# Patient Record
Sex: Female | Born: 1965 | Race: Black or African American | Hispanic: No | Marital: Married | Smoking: Never smoker
Health system: Southern US, Community
[De-identification: ages and names within clinical notes are randomized; demographics above are authoritative.]

## PROBLEM LIST (undated history)

## (undated) DIAGNOSIS — R002 Palpitations: Secondary | ICD-10-CM

## (undated) DIAGNOSIS — G40909 Epilepsy, unspecified, not intractable, without status epilepticus: Secondary | ICD-10-CM

## (undated) DIAGNOSIS — R0600 Dyspnea, unspecified: Secondary | ICD-10-CM

## (undated) DIAGNOSIS — E669 Obesity, unspecified: Secondary | ICD-10-CM

## (undated) DIAGNOSIS — R3129 Other microscopic hematuria: Secondary | ICD-10-CM

## (undated) DIAGNOSIS — C8590 Non-Hodgkin lymphoma, unspecified, unspecified site: Secondary | ICD-10-CM

## (undated) DIAGNOSIS — I10 Essential (primary) hypertension: Secondary | ICD-10-CM

## (undated) HISTORY — DX: Essential (primary) hypertension: I10

## (undated) HISTORY — DX: Epilepsy, unspecified, not intractable, without status epilepticus: G40.909

## (undated) HISTORY — DX: Palpitations: R00.2

## (undated) HISTORY — DX: Dyspnea, unspecified: R06.00

## (undated) HISTORY — DX: Non-Hodgkin lymphoma, unspecified, unspecified site: C85.90

## (undated) HISTORY — DX: Obesity, unspecified: E66.9

## (undated) HISTORY — DX: Other microscopic hematuria: R31.29

---

## 2006-06-09 HISTORY — PX: PORTACATH PLACEMENT: SHX2246

## 2006-07-10 DIAGNOSIS — C8590 Non-Hodgkin lymphoma, unspecified, unspecified site: Secondary | ICD-10-CM

## 2006-07-10 HISTORY — DX: Non-Hodgkin lymphoma, unspecified, unspecified site: C85.90

## 2007-06-10 HISTORY — PX: PORT-A-CATH REMOVAL: SHX5289

## 2009-02-01 ENCOUNTER — Emergency Department (HOSPITAL_COMMUNITY): Admission: EM | Admit: 2009-02-01 | Discharge: 2009-02-01 | Payer: Self-pay | Admitting: Emergency Medicine

## 2009-02-27 ENCOUNTER — Emergency Department (HOSPITAL_COMMUNITY): Admission: EM | Admit: 2009-02-27 | Discharge: 2009-02-27 | Payer: Self-pay | Admitting: Emergency Medicine

## 2009-04-05 ENCOUNTER — Ambulatory Visit: Payer: Self-pay | Admitting: Diagnostic Radiology

## 2009-04-05 ENCOUNTER — Ambulatory Visit (HOSPITAL_BASED_OUTPATIENT_CLINIC_OR_DEPARTMENT_OTHER): Admission: RE | Admit: 2009-04-05 | Discharge: 2009-04-05 | Payer: Self-pay

## 2009-07-27 ENCOUNTER — Other Ambulatory Visit: Admission: RE | Admit: 2009-07-27 | Discharge: 2009-07-27 | Payer: Self-pay | Admitting: Family Medicine

## 2009-10-17 ENCOUNTER — Ambulatory Visit: Payer: Self-pay | Admitting: Diagnostic Radiology

## 2009-10-17 ENCOUNTER — Ambulatory Visit (HOSPITAL_BASED_OUTPATIENT_CLINIC_OR_DEPARTMENT_OTHER): Admission: RE | Admit: 2009-10-17 | Discharge: 2009-10-17 | Payer: Self-pay | Admitting: *Deleted

## 2010-06-19 ENCOUNTER — Ambulatory Visit
Admission: RE | Admit: 2010-06-19 | Discharge: 2010-06-19 | Payer: Self-pay | Source: Home / Self Care | Attending: Internal Medicine | Admitting: Internal Medicine

## 2010-07-15 ENCOUNTER — Ambulatory Visit: Payer: Self-pay | Admitting: Family Medicine

## 2010-07-15 ENCOUNTER — Ambulatory Visit: Payer: 59 | Admitting: Internal Medicine

## 2010-07-15 DIAGNOSIS — I10 Essential (primary) hypertension: Secondary | ICD-10-CM

## 2010-07-16 ENCOUNTER — Ambulatory Visit: Payer: 59 | Admitting: Internal Medicine

## 2010-07-16 DIAGNOSIS — I10 Essential (primary) hypertension: Secondary | ICD-10-CM

## 2010-08-07 ENCOUNTER — Ambulatory Visit: Payer: 59 | Admitting: Internal Medicine

## 2010-08-14 ENCOUNTER — Ambulatory Visit: Payer: 59 | Admitting: Internal Medicine

## 2010-08-14 ENCOUNTER — Other Ambulatory Visit: Payer: Self-pay | Admitting: Internal Medicine

## 2010-08-14 ENCOUNTER — Ambulatory Visit (HOSPITAL_BASED_OUTPATIENT_CLINIC_OR_DEPARTMENT_OTHER)
Admission: RE | Admit: 2010-08-14 | Discharge: 2010-08-14 | Disposition: A | Discharge status: Home / Self Care | Payer: 59 | Source: Ambulatory Visit | Attending: Internal Medicine | Admitting: Internal Medicine

## 2010-08-14 DIAGNOSIS — I519 Heart disease, unspecified: Secondary | ICD-10-CM

## 2010-08-14 DIAGNOSIS — R319 Hematuria, unspecified: Secondary | ICD-10-CM

## 2010-08-14 DIAGNOSIS — Z87898 Personal history of other specified conditions: Secondary | ICD-10-CM | POA: Insufficient documentation

## 2010-08-14 DIAGNOSIS — I10 Essential (primary) hypertension: Secondary | ICD-10-CM

## 2010-08-20 ENCOUNTER — Other Ambulatory Visit: Payer: 59

## 2010-08-21 ENCOUNTER — Other Ambulatory Visit: Payer: 59

## 2010-08-22 ENCOUNTER — Other Ambulatory Visit: Payer: 59

## 2010-08-23 ENCOUNTER — Ambulatory Visit
Admission: RE | Admit: 2010-08-23 | Discharge: 2010-08-23 | Disposition: A | Discharge status: Home / Self Care | Payer: 59 | Source: Ambulatory Visit | Attending: Internal Medicine | Admitting: Internal Medicine

## 2010-08-23 DIAGNOSIS — I10 Essential (primary) hypertension: Secondary | ICD-10-CM

## 2010-09-09 ENCOUNTER — Other Ambulatory Visit (HOSPITAL_BASED_OUTPATIENT_CLINIC_OR_DEPARTMENT_OTHER): Payer: Self-pay | Admitting: Hematology and Oncology

## 2010-09-09 ENCOUNTER — Other Ambulatory Visit: Payer: Self-pay | Admitting: Internal Medicine

## 2010-09-09 DIAGNOSIS — C8590 Non-Hodgkin lymphoma, unspecified, unspecified site: Secondary | ICD-10-CM

## 2010-09-10 ENCOUNTER — Other Ambulatory Visit (HOSPITAL_BASED_OUTPATIENT_CLINIC_OR_DEPARTMENT_OTHER): Payer: 59

## 2010-09-11 ENCOUNTER — Other Ambulatory Visit (HOSPITAL_BASED_OUTPATIENT_CLINIC_OR_DEPARTMENT_OTHER): Payer: 59

## 2010-09-14 LAB — POCT I-STAT, CHEM 8
Calcium, Ion: 1.19 mmol/L (ref 1.12–1.32)
Creatinine, Ser: 0.7 mg/dL (ref 0.4–1.2)
Glucose, Bld: 104 mg/dL — ABNORMAL HIGH (ref 70–99)
HCT: 40 % (ref 36.0–46.0)
Hemoglobin: 13.6 g/dL (ref 12.0–15.0)
Potassium: 3.9 mEq/L (ref 3.5–5.1)
TCO2: 26 mmol/L (ref 0–100)

## 2010-09-16 ENCOUNTER — Other Ambulatory Visit (HOSPITAL_BASED_OUTPATIENT_CLINIC_OR_DEPARTMENT_OTHER): Payer: 59

## 2010-09-17 ENCOUNTER — Ambulatory Visit (HOSPITAL_BASED_OUTPATIENT_CLINIC_OR_DEPARTMENT_OTHER)
Admission: RE | Admit: 2010-09-17 | Discharge: 2010-09-17 | Disposition: A | Discharge status: Home / Self Care | Payer: 59 | Source: Ambulatory Visit | Attending: Hematology and Oncology | Admitting: Hematology and Oncology

## 2010-09-17 DIAGNOSIS — C8589 Other specified types of non-Hodgkin lymphoma, extranodal and solid organ sites: Secondary | ICD-10-CM | POA: Insufficient documentation

## 2010-09-17 DIAGNOSIS — C8590 Non-Hodgkin lymphoma, unspecified, unspecified site: Secondary | ICD-10-CM

## 2010-09-17 DIAGNOSIS — Z09 Encounter for follow-up examination after completed treatment for conditions other than malignant neoplasm: Secondary | ICD-10-CM | POA: Insufficient documentation

## 2010-09-17 MED ORDER — IOHEXOL 300 MG/ML  SOLN
97.0000 mL | Freq: Once | INTRAMUSCULAR | Status: AC | PRN
  Administered 2010-09-17: 97 mL via INTRAVENOUS

## 2010-10-17 ENCOUNTER — Other Ambulatory Visit: Payer: Self-pay | Admitting: Internal Medicine

## 2010-10-18 ENCOUNTER — Other Ambulatory Visit: Payer: Self-pay | Admitting: Internal Medicine

## 2010-10-18 DIAGNOSIS — Z1231 Encounter for screening mammogram for malignant neoplasm of breast: Secondary | ICD-10-CM

## 2010-10-21 ENCOUNTER — Ambulatory Visit (HOSPITAL_BASED_OUTPATIENT_CLINIC_OR_DEPARTMENT_OTHER)
Admission: RE | Admit: 2010-10-21 | Discharge: 2010-10-21 | Disposition: A | Discharge status: Home / Self Care | Payer: 59 | Source: Ambulatory Visit | Attending: Internal Medicine | Admitting: Internal Medicine

## 2010-10-21 DIAGNOSIS — Z1231 Encounter for screening mammogram for malignant neoplasm of breast: Secondary | ICD-10-CM

## 2010-11-13 ENCOUNTER — Ambulatory Visit (INDEPENDENT_AMBULATORY_CARE_PROVIDER_SITE_OTHER): Payer: 59 | Admitting: Internal Medicine

## 2010-11-13 DIAGNOSIS — R319 Hematuria, unspecified: Secondary | ICD-10-CM

## 2010-11-13 DIAGNOSIS — I10 Essential (primary) hypertension: Secondary | ICD-10-CM

## 2010-11-27 ENCOUNTER — Encounter: Payer: Self-pay | Admitting: Internal Medicine

## 2011-01-09 ENCOUNTER — Other Ambulatory Visit: Payer: Self-pay | Admitting: Emergency Medicine

## 2011-01-09 DIAGNOSIS — I10 Essential (primary) hypertension: Secondary | ICD-10-CM

## 2011-01-09 NOTE — Telephone Encounter (Signed)
Spoke with Carmie Kanner, RPh at [facility].  Gave VO for refill of amlodipine as above-dhp, rn

## 2011-01-13 MED ORDER — AMLODIPINE BESYLATE 5 MG PO TABS: 5.0000 mg | ORAL_TABLET | Freq: Every day | ORAL | Status: DC

## 2011-02-13 ENCOUNTER — Telehealth: Payer: Self-pay | Admitting: Internal Medicine

## 2011-02-13 ENCOUNTER — Other Ambulatory Visit (INDEPENDENT_AMBULATORY_CARE_PROVIDER_SITE_OTHER): Payer: 59

## 2011-02-13 ENCOUNTER — Encounter: Payer: Self-pay | Admitting: Internal Medicine

## 2011-02-13 DIAGNOSIS — R319 Hematuria, unspecified: Secondary | ICD-10-CM | POA: Insufficient documentation

## 2011-02-13 LAB — POCT URINALYSIS DIPSTICK
Bilirubin, UA: NEGATIVE
Nitrite, UA: NEGATIVE
Urobilinogen, UA: NORMAL

## 2011-02-13 NOTE — Telephone Encounter (Signed)
Spoke with pt and informed of results of urine.  She now has had 3 specimens with persistant microhematuria.  Will refer to urology for cysto.  CT of kidney negative.  Pt voices understanding

## 2011-03-20 ENCOUNTER — Other Ambulatory Visit (INDEPENDENT_AMBULATORY_CARE_PROVIDER_SITE_OTHER): Payer: 59 | Admitting: Emergency Medicine

## 2011-03-20 DIAGNOSIS — L659 Nonscarring hair loss, unspecified: Secondary | ICD-10-CM

## 2011-03-21 LAB — T4, FREE: Free T4: 1.1 ng/dL (ref 0.80–1.80)

## 2011-03-21 LAB — HEPATIC FUNCTION PANEL
AST: 19 U/L (ref 0–37)
Albumin: 4.8 g/dL (ref 3.5–5.2)
Bilirubin, Direct: 0.1 mg/dL (ref 0.0–0.3)
Indirect Bilirubin: 0.4 mg/dL (ref 0.0–0.9)
Total Protein: 7.5 g/dL (ref 6.0–8.3)

## 2011-03-21 LAB — BASIC METABOLIC PANEL
BUN: 14 mg/dL (ref 6–23)
CO2: 29 mEq/L (ref 19–32)
Calcium: 10.4 mg/dL (ref 8.4–10.5)
Creat: 0.71 mg/dL (ref 0.50–1.10)
Glucose, Bld: 98 mg/dL (ref 70–99)
Sodium: 140 mEq/L (ref 135–145)

## 2011-03-21 LAB — TSH: TSH: 0.981 u[IU]/mL (ref 0.350–4.500)

## 2011-03-24 ENCOUNTER — Encounter: Payer: Self-pay | Admitting: Emergency Medicine

## 2011-03-31 ENCOUNTER — Ambulatory Visit (HOSPITAL_BASED_OUTPATIENT_CLINIC_OR_DEPARTMENT_OTHER)
Admission: RE | Admit: 2011-03-31 | Discharge: 2011-03-31 | Disposition: A | Discharge status: Home / Self Care | Payer: 59 | Source: Ambulatory Visit | Attending: Urology | Admitting: Urology

## 2011-03-31 ENCOUNTER — Other Ambulatory Visit (HOSPITAL_BASED_OUTPATIENT_CLINIC_OR_DEPARTMENT_OTHER): Payer: Self-pay | Admitting: Urology

## 2011-03-31 MED ORDER — IOHEXOL 300 MG/ML  SOLN: 125.0000 mL | Freq: Once | INTRAMUSCULAR | Status: AC | PRN

## 2011-04-01 ENCOUNTER — Other Ambulatory Visit (HOSPITAL_BASED_OUTPATIENT_CLINIC_OR_DEPARTMENT_OTHER): Payer: 59

## 2011-04-02 ENCOUNTER — Other Ambulatory Visit (HOSPITAL_BASED_OUTPATIENT_CLINIC_OR_DEPARTMENT_OTHER): Payer: Self-pay | Admitting: Urology

## 2011-04-02 ENCOUNTER — Ambulatory Visit (HOSPITAL_BASED_OUTPATIENT_CLINIC_OR_DEPARTMENT_OTHER)
Admission: RE | Admit: 2011-04-02 | Discharge: 2011-04-02 | Disposition: A | Discharge status: Home / Self Care | Payer: 59 | Source: Ambulatory Visit | Attending: Urology | Admitting: Urology

## 2011-04-02 ENCOUNTER — Encounter (HOSPITAL_BASED_OUTPATIENT_CLINIC_OR_DEPARTMENT_OTHER): Payer: Self-pay

## 2011-04-02 DIAGNOSIS — R1909 Other intra-abdominal and pelvic swelling, mass and lump: Secondary | ICD-10-CM | POA: Insufficient documentation

## 2011-04-02 DIAGNOSIS — Z87898 Personal history of other specified conditions: Secondary | ICD-10-CM | POA: Insufficient documentation

## 2011-04-02 DIAGNOSIS — R3129 Other microscopic hematuria: Secondary | ICD-10-CM | POA: Insufficient documentation

## 2011-04-02 MED ORDER — IOHEXOL 300 MG/ML  SOLN
150.0000 mL | Freq: Once | INTRAMUSCULAR | Status: AC | PRN
  Administered 2011-04-02: 150 mL via INTRAVENOUS

## 2011-05-23 ENCOUNTER — Other Ambulatory Visit: Payer: Self-pay | Admitting: Internal Medicine

## 2011-05-26 NOTE — Telephone Encounter (Deleted)
Norvasc refilled via escribe.  Last OV 11/13/10, none pending

## 2011-05-26 NOTE — Telephone Encounter (Signed)
Norvasc refilled via escribe.  Last OV 11/13/10, no visit pending

## 2011-06-06 ENCOUNTER — Telehealth: Payer: Self-pay | Admitting: Emergency Medicine

## 2011-06-06 NOTE — Telephone Encounter (Signed)
Tiffany [name] called this morning with a question about symptoms she had last night at home.  She states while laying in the bed, she felt her heart begin to race, beat hard and fast "like it was going to beat out of my chest".  No other symptoms at the time.  No ShOB, no diaphoresis, chest pain, presyncopal feeling.  Would like to know if she needs to be seen or what to do?  Advised per DDS, as long as no warning cardiac signs, will see in office next week.  Appt scheduled Thursday at 1pm.  Advised if symptoms return, to seek immediate medical attention if symptoms are accompanied by chest pain, syncopal feeling, shortness of breath.  She is agreeable, verbalized understanding.

## 2011-06-12 ENCOUNTER — Ambulatory Visit: Payer: 59 | Admitting: Internal Medicine

## 2011-09-15 ENCOUNTER — Other Ambulatory Visit (HOSPITAL_BASED_OUTPATIENT_CLINIC_OR_DEPARTMENT_OTHER): Payer: Self-pay | Admitting: Hematology and Oncology

## 2011-09-15 ENCOUNTER — Other Ambulatory Visit: Payer: Self-pay | Admitting: Internal Medicine

## 2011-09-15 DIAGNOSIS — Z139 Encounter for screening, unspecified: Secondary | ICD-10-CM

## 2011-09-16 ENCOUNTER — Other Ambulatory Visit (HOSPITAL_BASED_OUTPATIENT_CLINIC_OR_DEPARTMENT_OTHER): Payer: Self-pay | Admitting: Hematology and Oncology

## 2011-09-17 ENCOUNTER — Ambulatory Visit (HOSPITAL_BASED_OUTPATIENT_CLINIC_OR_DEPARTMENT_OTHER)
Admission: RE | Admit: 2011-09-17 | Discharge: 2011-09-17 | Disposition: A | Discharge status: Home / Self Care | Payer: 59 | Source: Ambulatory Visit | Attending: Hematology and Oncology | Admitting: Hematology and Oncology

## 2011-09-17 DIAGNOSIS — Z0389 Encounter for observation for other suspected diseases and conditions ruled out: Secondary | ICD-10-CM

## 2011-09-17 DIAGNOSIS — C8190 Hodgkin lymphoma, unspecified, unspecified site: Secondary | ICD-10-CM | POA: Insufficient documentation

## 2011-09-17 DIAGNOSIS — Z9221 Personal history of antineoplastic chemotherapy: Secondary | ICD-10-CM | POA: Insufficient documentation

## 2011-09-17 DIAGNOSIS — R599 Enlarged lymph nodes, unspecified: Secondary | ICD-10-CM | POA: Insufficient documentation

## 2011-09-17 MED ORDER — IOHEXOL 300 MG/ML  SOLN
100.0000 mL | Freq: Once | INTRAMUSCULAR | Status: AC | PRN
  Administered 2011-09-17: 100 mL via INTRAVENOUS

## 2011-10-22 ENCOUNTER — Ambulatory Visit (HOSPITAL_BASED_OUTPATIENT_CLINIC_OR_DEPARTMENT_OTHER)
Admission: RE | Admit: 2011-10-22 | Discharge: 2011-10-22 | Disposition: A | Discharge status: Home / Self Care | Payer: 59 | Source: Ambulatory Visit | Attending: Internal Medicine | Admitting: Internal Medicine

## 2011-10-22 DIAGNOSIS — Z139 Encounter for screening, unspecified: Secondary | ICD-10-CM

## 2011-10-22 DIAGNOSIS — Z1231 Encounter for screening mammogram for malignant neoplasm of breast: Secondary | ICD-10-CM | POA: Insufficient documentation

## 2011-11-12 ENCOUNTER — Ambulatory Visit (INDEPENDENT_AMBULATORY_CARE_PROVIDER_SITE_OTHER): Payer: 59 | Admitting: Internal Medicine

## 2011-11-12 ENCOUNTER — Encounter: Payer: Self-pay | Admitting: Internal Medicine

## 2011-11-12 VITALS — BP 136/88 | HR 90 | Temp 98.1°F | Ht 59.0 in | Wt 164.0 lb

## 2011-11-12 DIAGNOSIS — Z1151 Encounter for screening for human papillomavirus (HPV): Secondary | ICD-10-CM

## 2011-11-12 DIAGNOSIS — Z01419 Encounter for gynecological examination (general) (routine) without abnormal findings: Secondary | ICD-10-CM

## 2011-11-12 DIAGNOSIS — C8590 Non-Hodgkin lymphoma, unspecified, unspecified site: Secondary | ICD-10-CM

## 2011-11-12 DIAGNOSIS — Z803 Family history of malignant neoplasm of breast: Secondary | ICD-10-CM

## 2011-11-12 DIAGNOSIS — R319 Hematuria, unspecified: Secondary | ICD-10-CM

## 2011-11-12 DIAGNOSIS — Z8669 Personal history of other diseases of the nervous system and sense organs: Secondary | ICD-10-CM

## 2011-11-12 DIAGNOSIS — C8589 Other specified types of non-Hodgkin lymphoma, extranodal and solid organ sites: Secondary | ICD-10-CM

## 2011-11-12 DIAGNOSIS — Z124 Encounter for screening for malignant neoplasm of cervix: Secondary | ICD-10-CM

## 2011-11-12 LAB — POCT URINALYSIS DIPSTICK
Glucose, UA: NEGATIVE
Nitrite, UA: NEGATIVE
Protein, UA: NEGATIVE
Urobilinogen, UA: 0.2

## 2011-11-12 MED ORDER — ESZOPICLONE 2 MG PO TABS: 2.0000 mg | ORAL_TABLET | Freq: Every day | ORAL | Status: DC

## 2011-11-12 NOTE — Progress Notes (Signed)
Subjective:    Patient ID: Tiffany [name], female    DOB: April 13, 1966, 46 y.o.   MRN: [id number]  HPI Tiffany [name] is here for a comprehensive eval.  She is doing well and is very happy that she has reached her 5 year remission milestone for her NHL.  She was  informed  that her oncologist from [facility] Dr. Elaina Hoops has released her from oncology care and told her she does not need anymore CT scans.  She is having trouble with frequent night time awakenings.  Falls asleep OK but frequently wakes up.  NO night time flushing.    No Known Allergies Past Medical History  Diagnosis Date  . Non Hodgkin's lymphoma 2/08    stage III  . Obesity   . Seizure disorder   . Microscopic hematuria   . Hypertension    Past Surgical History  Procedure Date  . Cesarean section 1998, 2002  . Portacath placement 2008  . Port-a-cath removal 2009   History   Social History  . Marital Status: Married    Spouse Name: Greggory Stallion    Number of Children: 2  . Years of Education: college   Occupational History  . PHLEBOTOMIST    Social History Main Topics  . Smoking status: Never Smoker   . Smokeless tobacco: Never Used  . Alcohol Use: No  . Drug Use: No  . Sexually Active: Yes -- Female partner(s)   Other Topics Concern  . Not on file   Social History Narrative  . No narrative on file   Family History  Problem Relation Age of Onset  . Breast cancer Mother   . Hypertension Mother   . Hypertension Father   . Hypertension Maternal Grandmother   . Hypertension Paternal Grandmother    Patient Active Problem List  Diagnoses  . Hematuria   Current Outpatient Prescriptions on File Prior to Visit  Medication Sig Dispense Refill  . amLODipine (NORVASC) 5 MG tablet TAKE ONE TABLET BY MOUTH EVERY DAY  30 tablet  1  . Cholecalciferol (VITAMIN D) 2000 UNITS CAPS Take 1 capsule by mouth daily.        [name] lamoTRIgine (LAMICTAL) 100 MG tablet Take 100 mg by mouth 2 (two) times daily.        .  eszopiclone (LUNESTA) 2 MG TABS Take 1 tablet (2 mg total) by mouth at bedtime. Take immediately before bedtime  30 tablet  0       Review of Systems    see HPI Objective:   Physical Exam Physical Exam  Vital signs and nursing note reviewed  Constitutional: She is oriented to person, place, and time. She appears well-developed and well-nourished. She is cooperative.  HENT:  Head: Normocephalic and atraumatic.  Right Ear: Tympanic membrane normal.  Left Ear: Tympanic membrane normal.  Nose: Nose normal.  Mouth/Throat: Oropharynx is clear and moist and mucous membranes are normal. No oropharyngeal exudate or posterior oropharyngeal erythema.  Eyes: Conjunctivae and EOM are normal. Pupils are equal, round, and reactive to light.  Neck: Neck supple. No JVD present. Carotid bruit is not present. No mass and no thyromegaly present.  Cardiovascular: Regular rhythm, normal heart sounds, intact distal pulses and normal pulses.  Exam reveals no gallop and no friction rub.   No murmur heard. Pulses:      Dorsalis pedis pulses are 2+ on the right side, and 2+ on the left side.  Pulmonary/Chest: Breath sounds normal. She has no wheezes. She has no rhonchi. She  has no rales. Right breast exhibits no mass, no nipple discharge and no skin change. Left breast exhibits no mass, no nipple discharge and no skin change.  Abdominal: Soft. Bowel sounds are normal. She exhibits no distension and no mass. There is no hepatosplenomegaly. There is no tenderness. There is no CVA tenderness.  Genitourinary: Rectum normal, vagina normal and uterus normal.  No labial fusion. There is no lesion on the right labia. There is no lesion on the left labia. Cervix exhibits no motion tenderness. Right adnexum displays no mass, no tenderness and no fullness. Left adnexum displays no mass, no tenderness and no fullness. No erythema around the vagina.  Musculoskeletal:       No active synovitis to any joint.      Lymphadenopathy:       Right cervical: No superficial cervical adenopathy present.      Left cervical: No superficial cervical adenopathy present.       Right axillary: No pectoral and no lateral adenopathy present.       Left axillary: No pectoral and no lateral adenopathy present.      Right: No inguinal adenopathy present.       Left: No inguinal adenopathy present.  Neurological: She is alert and oriented to person, place, and time. She has normal strength and normal reflexes. No cranial nerve deficit or sensory deficit. She displays a negative Romberg sign. Coordination and gait normal.  Skin: Skin is warm and dry. No abrasion, no bruising, no ecchymosis and no rash noted. No cyanosis. Nails show no clubbing.  Psychiatric: She has a normal mood and affect. Her speech is normal and behavior is normal.          Assessment & Plan:  Health Maintenance  See scanned sheet will get fasting labs tomorrow  Insomnia  Will try Lunesta 2 mg 2-3 times per week hs  NHL  FH mother breast Cancer  Microscopic Hematuria  Work up neg urology        Assessment & Plan:

## 2011-11-12 NOTE — Patient Instructions (Signed)
See me as needed  Call if further poblems with sleep

## 2011-11-13 LAB — CBC WITH DIFFERENTIAL/PLATELET
Basophils Absolute: 0 10*3/uL (ref 0.0–0.1)
Eosinophils Absolute: 0.2 10*3/uL (ref 0.0–0.7)
Lymphocytes Relative: 29 % (ref 12–46)
Lymphs Abs: 1.5 10*3/uL (ref 0.7–4.0)
Neutrophils Relative %: 59 % (ref 43–77)
Platelets: 345 10*3/uL (ref 150–400)
RBC: 4.88 MIL/uL (ref 3.87–5.11)
RDW: 13.9 % (ref 11.5–15.5)
WBC: 5.3 10*3/uL (ref 4.0–10.5)

## 2011-11-13 LAB — COMPREHENSIVE METABOLIC PANEL
ALT: 13 U/L (ref 0–35)
AST: 16 U/L (ref 0–37)
CO2: 30 mEq/L (ref 19–32)
Creat: 0.88 mg/dL (ref 0.50–1.10)
Sodium: 141 mEq/L (ref 135–145)
Total Bilirubin: 0.6 mg/dL (ref 0.3–1.2)
Total Protein: 7.2 g/dL (ref 6.0–8.3)

## 2011-11-13 LAB — LIPID PANEL
Cholesterol: 152 mg/dL (ref 0–200)
LDL Cholesterol: 85 mg/dL (ref 0–99)
Total CHOL/HDL Ratio: 2.8 Ratio
VLDL: 13 mg/dL (ref 0–40)

## 2011-11-13 LAB — TSH: TSH: 1.516 u[IU]/mL (ref 0.350–4.500)

## 2011-11-14 LAB — VITAMIN D 25 HYDROXY (VIT D DEFICIENCY, FRACTURES): Vit D, 25-Hydroxy: 44 ng/mL (ref 30–89)

## 2011-11-18 ENCOUNTER — Telehealth: Payer: Self-pay | Admitting: *Deleted

## 2011-11-18 NOTE — Telephone Encounter (Signed)
Pt notified of normal pap smear and repeat in 2 years.

## 2011-11-20 ENCOUNTER — Telehealth: Payer: Self-pay | Admitting: Internal Medicine

## 2011-11-20 NOTE — Telephone Encounter (Signed)
Spoke with pt.  She is having trace blood in stool last 2 days.  Counseled to get hemmorrhoid suppository and see me in office on Monday  She voices understanding

## 2011-12-08 ENCOUNTER — Ambulatory Visit (INDEPENDENT_AMBULATORY_CARE_PROVIDER_SITE_OTHER): Payer: 59 | Admitting: Family

## 2011-12-08 ENCOUNTER — Encounter: Payer: Self-pay | Admitting: Family

## 2011-12-08 VITALS — BP 126/88 | HR 99 | Temp 98.4°F | Resp 16

## 2011-12-08 DIAGNOSIS — R0789 Other chest pain: Secondary | ICD-10-CM

## 2011-12-08 MED ORDER — CALCIUM CARBONATE ANTACID 500 MG PO CHEW: CHEWABLE_TABLET | ORAL | Status: DC

## 2011-12-08 MED ORDER — ESOMEPRAZOLE MAGNESIUM 40 MG PO CPDR: 40.0000 mg | DELAYED_RELEASE_CAPSULE | Freq: Every day | ORAL | Status: DC

## 2011-12-08 NOTE — Assessment & Plan Note (Signed)
New.  EKG is performed today and is personally reviewed.  It shows normal sinus rhythm- no ischemic changes. Symptoms and history is most consistent with GERD.  Recommended a 2 week course of nexium (samples provided) as well as tums PRN.  We did discuss that if symptoms worsen, she should proceed to the ed and she verbalizes understanding.

## 2011-12-08 NOTE — Patient Instructions (Addendum)
Go to ER if you develop worsening chest pain.  Diet for GERD or PUD Nutrition therapy can help ease the discomfort of gastroesophageal reflux disease (GERD) and peptic ulcer disease (PUD).  HOME CARE INSTRUCTIONS   Eat your meals slowly, in a relaxed setting.   Eat 5 to 6 small meals per day.   If a food causes distress, stop eating it for a period of time.  FOODS TO AVOID  Coffee, regular or decaffeinated.   Cola beverages, regular or low calorie.   Tea, regular or decaffeinated.   Pepper.   Cocoa.   High fat foods, including meats.   Butter, margarine, hydrogenated oil (trans fats).   Peppermint or spearmint (if you have GERD).   Fruits and vegetables if not tolerated.   Alcohol.   Nicotine (smoking or chewing). This is one of the most potent stimulants to acid production in the gastrointestinal tract.   Any food that seems to aggravate your condition.  If you have questions regarding your diet, ask your caregiver or a registered dietitian. TIPS  Lying flat may make symptoms worse. Keep the head of your bed raised 6 to 9 inches (15 to 23 cm) by using a foam wedge or blocks under the legs of the bed.   Do not lay down until 3 hours after eating a meal.   Daily physical activity may help reduce symptoms.  MAKE SURE YOU:   Understand these instructions.   Will watch your condition.   Will get help right away if you are not doing well or get worse.  Document Released: 05/26/2005 Document Revised: 05/15/2011 Document Reviewed: 04/11/2011 [facility] Patient Information 2012 [place], [place].

## 2011-12-08 NOTE — Progress Notes (Signed)
Subjective:    Patient ID: Tiffany [name], female    DOB: 1966/01/19, 46 y.o.   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  She usually sees Dr. Constance Goltz, who is off this afternoon.  (Dr. Katherina Mires records are reviewed). History is significant for seizure disorder (on lamictal and well controlled), HTN (on amlodipine and well controlled) and non-hodgkin's lymphoma which is reportedly in remission.    Chest pain- She reports epigastric discomfort which started last night.  Woke up this AM was fine, then worsened since she got up this AM.  Drank some soda, subsided some, but came back.  Denies associated arm pain, nausea vomitting, dizziness or shortness of breath.  Reports that she at 3 pieces of pizza on Saturday.    Review of Systems See HPI  Past Medical History  Diagnosis Date  . Non Hodgkin's lymphoma 2/08    stage III  . Obesity   . Seizure disorder   . Microscopic hematuria   . Hypertension     History   Social History  . Marital Status: Married    Spouse Name: Greggory Stallion    Number of Children: 2  . Years of Education: college   Occupational History  . PHLEBOTOMIST    Social History Main Topics  . Smoking status: Never Smoker   . Smokeless tobacco: Never Used  . Alcohol Use: No  . Drug Use: No  . Sexually Active: Yes -- Female partner(s)   Other Topics Concern  . Not on file   Social History Narrative  . No narrative on file    Past Surgical History  Procedure Date  . Cesarean section 1998, 2002  . Portacath placement 2008  . Port-a-cath removal 2009    Family History  Problem Relation Age of Onset  . Breast cancer Mother   . Hypertension Mother   . Hypertension Father   . Hypertension Maternal Grandmother   . Hypertension Paternal Grandmother     No Known Allergies  Current Outpatient Prescriptions on File Prior to Visit    Medication Sig Dispense Refill  . amLODipine (NORVASC) 5 MG tablet TAKE ONE TABLET BY MOUTH EVERY DAY  30 tablet  1  . Cholecalciferol (VITAMIN D) 2000 UNITS CAPS Take 1 capsule by mouth daily.        [name] lamoTRIgine (LAMICTAL) 100 MG tablet Take 100 mg by mouth 2 (two) times daily.        [name] esomeprazole (NEXIUM) 40 MG capsule Take 1 capsule (40 mg total) by mouth daily.  14 capsule  0  . eszopiclone (LUNESTA) 2 MG TABS Take 1 tablet (2 mg total) by mouth at bedtime. Take immediately before bedtime  30 tablet  0    BP 126/88  Pulse 99  Temp 98.4 F (36.9 C) (Oral)  Resp 16       Objective:   Physical Exam  Constitutional: She appears well-developed and well-nourished. No distress.  HENT:  Head: Normocephalic and atraumatic.  Cardiovascular: Normal rate and regular rhythm.   No murmur heard. Pulmonary/Chest: Effort normal and breath sounds normal. No respiratory distress. She has no wheezes. She has no rales. She exhibits no tenderness.  Abdominal: Soft. Bowel sounds are normal.  Musculoskeletal: She exhibits no edema.  Lymphadenopathy:    She has no cervical adenopathy.  Skin: Skin is warm and dry.  Psychiatric: She has  a normal mood and affect. Her behavior is normal. Judgment and thought content normal.          Assessment & Plan:

## 2011-12-15 ENCOUNTER — Other Ambulatory Visit: Payer: Self-pay | Admitting: Internal Medicine

## 2012-02-17 ENCOUNTER — Encounter: Payer: Self-pay | Admitting: Family

## 2012-02-17 ENCOUNTER — Ambulatory Visit (INDEPENDENT_AMBULATORY_CARE_PROVIDER_SITE_OTHER): Payer: 59 | Admitting: Family

## 2012-02-17 VITALS — BP 120/80 | HR 114 | Temp 99.2°F | Resp 16

## 2012-02-17 DIAGNOSIS — J029 Acute pharyngitis, unspecified: Secondary | ICD-10-CM

## 2012-02-17 LAB — POCT RAPID STREP A (OFFICE): Rapid Strep A Screen: NEGATIVE

## 2012-02-17 NOTE — Progress Notes (Signed)
  Subjective:    Patient ID: Tiffany [name], female    DOB: [birth year], 46 y.o.   MRN: [id number]  HPI  Tiffany [name] is a 46 yr old female who presents today with chief complaint of sore throat.  Symptoms started yesterday. Tinge of yellow in her nose this AM. Denies coughing. Denies ear pain.        Review of Systems  See HPI      Objective:   Physical Exam  Constitutional: She is oriented to person, place, and time. She appears well-developed and well-nourished.  HENT:  Head: Normocephalic and atraumatic.  Right Ear: Tympanic membrane and ear canal normal.  Left Ear: Tympanic membrane and ear canal normal.  Mouth/Throat: Posterior oropharyngeal erythema present. No oropharyngeal exudate, posterior oropharyngeal edema or tonsillar abscesses.  Neck:       Mild right cervical adenopathy/tenderness.  Cardiovascular: Normal rate and regular rhythm.   No murmur heard. Pulmonary/Chest: Effort normal and breath sounds normal. No respiratory distress. She has no wheezes. She has no rales. She exhibits no tenderness.  Neurological: She is alert and oriented to person, place, and time.  Skin: Skin is warm and dry.  Psychiatric: She has a normal mood and affect. Her behavior is normal. Judgment and thought content normal.          Assessment & Plan:

## 2012-02-17 NOTE — Assessment & Plan Note (Signed)
Rapid strep is negative. Likely viral etiology.   Recommended ibuprofen/chloraseptic spray as needed.  Recommended that pt call if fever >101, if symptoms worsen, or if not feeling better in 2-3 days.

## 2012-04-26 ENCOUNTER — Ambulatory Visit (INDEPENDENT_AMBULATORY_CARE_PROVIDER_SITE_OTHER): Payer: 59 | Admitting: *Deleted

## 2012-04-26 DIAGNOSIS — Z111 Encounter for screening for respiratory tuberculosis: Secondary | ICD-10-CM

## 2012-06-22 ENCOUNTER — Ambulatory Visit (INDEPENDENT_AMBULATORY_CARE_PROVIDER_SITE_OTHER): Payer: 59 | Admitting: Family

## 2012-06-22 ENCOUNTER — Encounter: Payer: Self-pay | Admitting: Family

## 2012-06-22 VITALS — BP 140/98 | HR 95 | Temp 98.5°F | Resp 16

## 2012-06-22 DIAGNOSIS — R0789 Other chest pain: Secondary | ICD-10-CM

## 2012-06-22 DIAGNOSIS — R079 Chest pain, unspecified: Secondary | ICD-10-CM

## 2012-06-22 DIAGNOSIS — I10 Essential (primary) hypertension: Secondary | ICD-10-CM

## 2012-06-22 MED ORDER — ESOMEPRAZOLE MAGNESIUM 40 MG PO CPDR: 40.0000 mg | DELAYED_RELEASE_CAPSULE | Freq: Every day | ORAL | Status: DC

## 2012-06-22 MED ORDER — AMLODIPINE BESYLATE 10 MG PO TABS: 10.0000 mg | ORAL_TABLET | Freq: Every day | ORAL | Status: DC

## 2012-06-22 NOTE — Progress Notes (Signed)
  Subjective:    Patient ID: Tiffany [name], female    DOB: 1965/09/07, 47 y.o.   MRN: [id number]  HPI  Intermittent chest pressure started 1 month ago.  Now constant- 6/10 since last night.  She reports that she has been checking blood pressure at home 130-140/82-90  R 150-160/90-98.  Last dose of amlodipine was this morning.  Pain is mid chest and back up to throat.  Denies associated sob.  Not worsened by exertion. Does not radiate.  No nausea.  Not taking nexium regularly.  Denies calf pain or recent long car trip.    Review of Systems See HPI  Past Medical History  Diagnosis Date  . Non Hodgkin's lymphoma 2/08    stage III  . Obesity   . Seizure disorder   . Microscopic hematuria   . Hypertension     History   Social History  . Marital Status: Married    Spouse Name: Greggory Stallion    Number of Children: 2  . Years of Education: college   Occupational History  . PHLEBOTOMIST    Social History Main Topics  . Smoking status: Never Smoker   . Smokeless tobacco: Never Used  . Alcohol Use: No  . Drug Use: No  . Sexually Active: Yes -- Female partner(s)   Other Topics Concern  . Not on file   Social History Narrative  . No narrative on file    Past Surgical History  Procedure Date  . Cesarean section 1998, 2002  . Portacath placement 2008  . Port-a-cath removal 2009    Family History  Problem Relation Age of Onset  . Breast cancer Mother   . Hypertension Mother   . Hypertension Father   . Hypertension Maternal Grandmother   . Hypertension Paternal Grandmother     No Known Allergies  Current Outpatient Prescriptions on File Prior to Visit  Medication Sig Dispense Refill  . amLODipine (NORVASC) 5 MG tablet TAKE 1 TABLET BY MOUTH ONCE DAILY  90 tablet  3  . lamoTRIgine (LAMICTAL) 100 MG tablet Take 100 mg by mouth 2 (two) times daily.        . calcium carbonate (TUMS) 500 MG chewable tablet One tablet 3 times daily as needed for reflux.      .  Cholecalciferol (VITAMIN D) 2000 UNITS CAPS Take 1 capsule by mouth daily.          BP 140/98  Pulse 95  Temp 98.5 F (36.9 C) (Oral)  Resp 16  SpO2 99%       Objective:   Physical Exam  Constitutional: She appears well-developed and well-nourished. No distress.  HENT:  Head: Normocephalic and atraumatic.  Cardiovascular: Normal rate.   No murmur heard. Pulmonary/Chest: Effort normal and breath sounds normal. No respiratory distress. She has no wheezes. She has no rales. She exhibits no tenderness.  Musculoskeletal: She exhibits no edema.  Skin: Skin is warm.  Psychiatric: She has a normal mood and affect. Her behavior is normal. Judgment and thought content normal.          Assessment & Plan:

## 2012-06-22 NOTE — Assessment & Plan Note (Signed)
Pt stopped nexium.  Pain is mid chest and radiates up esophagus.  I suspect that this pain is likely GERD related.  EKG is performed today and shows NSR without acute changes. I will arrange a stress test however.  Pt is agreeable.

## 2012-06-22 NOTE — Assessment & Plan Note (Signed)
Deteriorated.  Increase amlodipine from 5-10mg  once daily.

## 2012-06-22 NOTE — Patient Instructions (Addendum)
Go to ER if worsening chest pain or shortness of breath. Increase amlodipine. Follow up in 2 weeks for BP check. You will be contacted about your referral for stress test.  Please let us know if you have not heard back within 1 week about your referral.

## 2012-07-07 ENCOUNTER — Encounter (HOSPITAL_COMMUNITY): Payer: 59

## 2012-07-08 ENCOUNTER — Telehealth: Payer: Self-pay | Admitting: *Deleted

## 2012-07-08 MED ORDER — ESOMEPRAZOLE MAGNESIUM 40 MG PO CPDR: 40.0000 mg | DELAYED_RELEASE_CAPSULE | Freq: Every day | ORAL | Status: DC

## 2012-07-08 NOTE — Telephone Encounter (Signed)
Received call from pt stating Nexium has been working well, no further chest pain. Pt is requesting refill to [facility]. Refill sent, pt has follow up tomorrow at 1:15pm.

## 2012-07-09 ENCOUNTER — Encounter: Payer: Self-pay | Admitting: Family

## 2012-07-09 ENCOUNTER — Ambulatory Visit (INDEPENDENT_AMBULATORY_CARE_PROVIDER_SITE_OTHER): Payer: 59 | Admitting: Family

## 2012-07-09 VITALS — BP 130/80 | HR 93 | Temp 98.6°F | Resp 16 | Wt 154.0 lb

## 2012-07-09 DIAGNOSIS — R0789 Other chest pain: Secondary | ICD-10-CM

## 2012-07-09 DIAGNOSIS — I10 Essential (primary) hypertension: Secondary | ICD-10-CM

## 2012-07-09 NOTE — Assessment & Plan Note (Signed)
Blood pressure improved on amlodipine 10.  Continue current dose.

## 2012-07-09 NOTE — Patient Instructions (Addendum)
Please schedule a follow up appointment in 3 months.

## 2012-07-09 NOTE — Assessment & Plan Note (Signed)
On PPI, symptoms resolved.  Likely due to gerd.

## 2012-07-09 NOTE — Progress Notes (Signed)
  Subjective:    Patient ID: Tiffany [name], female    DOB: 12-19-1965, 47 y.o.   MRN: [id number]  HPI  Pt presents today for follow up.   GERD- reports chest pain is improved on nexium.  No longer having chest pain.  HTN-  Denies SOB, CP, Swelling.      Review of Systems     Objective:   Physical Exam  Constitutional: She is oriented to person, place, and time. She appears well-developed and well-nourished. No distress.  HENT:  Head: Normocephalic and atraumatic.  Cardiovascular: Normal rate and regular rhythm.   No murmur heard. Pulmonary/Chest: Effort normal and breath sounds normal.  Neurological: She is alert and oriented to person, place, and time.  Skin: Skin is warm and dry.  Psychiatric: She has a normal mood and affect. Her behavior is normal. Judgment and thought content normal.          Assessment & Plan:

## 2012-07-13 ENCOUNTER — Ambulatory Visit (HOSPITAL_COMMUNITY): Payer: 59 | Attending: Cardiology | Admitting: Radiology

## 2012-07-13 VITALS — BP 125/84 | Ht 59.0 in | Wt 150.0 lb

## 2012-07-13 DIAGNOSIS — R079 Chest pain, unspecified: Secondary | ICD-10-CM

## 2012-07-13 DIAGNOSIS — I10 Essential (primary) hypertension: Secondary | ICD-10-CM | POA: Insufficient documentation

## 2012-07-13 DIAGNOSIS — R0789 Other chest pain: Secondary | ICD-10-CM

## 2012-07-13 MED ORDER — TECHNETIUM TC 99M SESTAMIBI GENERIC - CARDIOLITE
30.0000 | Freq: Once | INTRAVENOUS | Status: AC | PRN
  Administered 2012-07-13: 30 via INTRAVENOUS

## 2012-07-13 MED ORDER — TECHNETIUM TC 99M SESTAMIBI GENERIC - CARDIOLITE
10.0000 | Freq: Once | INTRAVENOUS | Status: AC | PRN
  Administered 2012-07-13: 10 via INTRAVENOUS

## 2012-07-13 NOTE — Progress Notes (Signed)
[facility] SITE 3 NUCLEAR MED [address] [place], [place] [address] [phone]    Cardiology Nuclear Med Study  Tiffany [name] is a 47 y.o. female     MRN : [id number]     DOB: [birth year]  Procedure Date: 07/13/2012  Nuclear Med Background Indication for Stress Test:  Evaluation for Ischemia History:  NoN Hodgkins Lymphoma Stage III H/O Seizures Cardiac Risk Factors: Hypertension  Symptoms:  Chest Pressure.  (last date of chest discomfort constant)   Nuclear Pre-Procedure Caffeine/Decaff Intake:  None> 12 hrs NPO After: 7:00pm   Lungs:  clear O2 Sat: 98% on room air. IV 0.9% NS with Angio Cath:  22g  IV Site: L Antecubital x 1, tolerated well IV Started by:  Irean Hong, RN  Chest Size (in):  38 Cup Size: C  Height: 4\' 11"  (1.499 m)  Weight:  150 lb (68.04 kg)  BMI:  Body mass index is 30.30 kg/(m^2). Tech Comments:  Took Norvasc this am. This patient exceeded her HR, but stated she wanted to walk and felt good.    Nuclear Med Study 1 or 2 day study: 1 day  Stress Test Type:  Stress  Reading MD: Olga Millers, MD  Order Authorizing Provider:  Reuel Derby, MD, and Sandford Craze, NP  Resting Radionuclide: Technetium 3m Sestamibi  Resting Radionuclide Dose: 11.0 mCi   Stress Radionuclide:  Technetium 39m Sestamibi  Stress Radionuclide Dose: 32.8 mCi           Stress Protocol Rest HR: 87 Stress HR: 88  Rest BP: 125/84 Stress BP: 123/84  Exercise Time (min): 9:33 METS: 10.90   Predicted Max HR: 176 bpm % Max HR: 111.36 bpm Rate Pressure Product: 78469    Dose of Adenosine (mg):  n/a Dose of Lexiscan: n/a mg  Dose of Atropine (mg): n/a Dose of Dobutamine: n/a mcg/kg/min (at max HR)  Stress Test Technologist: Milana Na, EMT-P  Nuclear Technologist:  Domenic Polite, CNMT     Rest Procedure:  Myocardial perfusion imaging was performed at rest 45 minutes following the intravenous administration of Technetium 39m Sestamibi. Rest ECG:  NSR - Normal EKG  Stress Procedure:  The patient exercised on the treadmill utilizing the Bruce Protocol for 9:33 minutes. The patient stopped due to fatigue, + rare pvcs, and  denied any chest pain.  Technetium 63m Sestamibi was injected at peak exercise and myocardial perfusion imaging was performed after a brief delay. Stress ECG: No significant ST segment change suggestive of ischemia.  QPS Raw Data Images:  There is interference from nuclear activity from structures below the diaphragm. This does not affect the ability to read the study. Stress Images:  Normal homogeneous uptake in all areas of the myocardium. Rest Images:  Normal homogeneous uptake in all areas of the myocardium. Subtraction (SDS):  No evidence of ischemia. Transient Ischemic Dilatation (Normal <1.22):  1.05 Lung/Heart Ratio (Normal <0.45):  0.26  Quantitative Gated Spect Images QGS EDV:  83 ml QGS ESV:  40 ml  Impression Exercise Capacity:  Good exercise capacity. BP Response:  Normal blood pressure response. Clinical Symptoms:  No chest pain or dyspnea. ECG Impression:  No significant ST segment change suggestive of ischemia. Comparison with Prior Nuclear Study: No images to compare  Overall Impression:  Normal stress nuclear study.  LV Ejection Fraction: 52%.  LV Wall Motion:  NL LV Function; NL Wall Motion   Olga Millers

## 2012-07-14 ENCOUNTER — Telehealth: Payer: Self-pay | Admitting: Family

## 2012-07-14 DIAGNOSIS — R002 Palpitations: Secondary | ICD-10-CM

## 2012-07-14 NOTE — Telephone Encounter (Signed)
Pt reports intermittent "rapid heart beat." denies chest pain. Stress test negative.  Will refer to cardiology for further eval.

## 2012-08-11 ENCOUNTER — Ambulatory Visit: Payer: 59 | Admitting: Cardiology

## 2012-08-18 ENCOUNTER — Encounter: Payer: Self-pay | Admitting: Internal Medicine

## 2012-08-18 ENCOUNTER — Ambulatory Visit (INDEPENDENT_AMBULATORY_CARE_PROVIDER_SITE_OTHER): Payer: 59 | Admitting: Internal Medicine

## 2012-08-18 VITALS — BP 125/73 | HR 91 | Temp 98.4°F | Resp 18 | Wt 150.0 lb

## 2012-08-18 DIAGNOSIS — R222 Localized swelling, mass and lump, trunk: Secondary | ICD-10-CM

## 2012-08-18 DIAGNOSIS — C8589 Other specified types of non-Hodgkin lymphoma, extranodal and solid organ sites: Secondary | ICD-10-CM

## 2012-08-18 DIAGNOSIS — C8590 Non-Hodgkin lymphoma, unspecified, unspecified site: Secondary | ICD-10-CM

## 2012-08-18 NOTE — Progress Notes (Signed)
Subjective:    Patient ID: Tiffany [name], female    DOB: 12/28/1965, 47 y.o.   MRN: [id number]  HPI  Tiffany [name] is here for acute visit.  She has noted  A swelling on L side of her chest wall over the last week.  No pain.  Also noted swelling of L side of hip.  She is concerned about recurrence of her lymphoma  No report of edema of Lower extremities  No Known Allergies Past Medical History  Diagnosis Date  . Non Hodgkin's lymphoma 2/08    stage III  . Obesity   . Seizure disorder   . Microscopic hematuria   . Hypertension    Past Surgical History  Procedure Laterality Date  . Cesarean section  1998, 2002  . Portacath placement  2008  . Port-a-cath removal  2009   History   Social History  . Marital Status: Married    Spouse Name: Greggory Stallion    Number of Children: 2  . Years of Education: college   Occupational History  . PHLEBOTOMIST    Social History Main Topics  . Smoking status: Never Smoker   . Smokeless tobacco: Never Used  . Alcohol Use: No  . Drug Use: No  . Sexually Active: Yes -- Female partner(s)   Other Topics Concern  . Not on file   Social History Narrative  . No narrative on file   Family History  Problem Relation Age of Onset  . Breast cancer Mother   . Hypertension Mother   . Hypertension Father   . Hypertension Maternal Grandmother   . Hypertension Paternal Grandmother    Patient Active Problem List  Diagnosis  . Hematuria  . Non Hodgkin's lymphoma  . Family history of breast cancer  . History of seizure disorder  . Atypical chest pain  . HTN (hypertension)   Current Outpatient Prescriptions on File Prior to Visit  Medication Sig Dispense Refill  . amLODipine (NORVASC) 10 MG tablet Take 1 tablet (10 mg total) by mouth daily.  30 tablet  2  . calcium carbonate (TUMS) 500 MG chewable tablet One tablet 3 times daily as needed for reflux.      . Cholecalciferol (VITAMIN D) 2000 UNITS CAPS Take 1 capsule by mouth daily.        [name]  esomeprazole (NEXIUM) 40 MG capsule Take 1 capsule (40 mg total) by mouth daily.  30 capsule  2  . lamoTRIgine (LAMICTAL) 100 MG tablet Take 100 mg by mouth 2 (two) times daily.        . Multiple Vitamins-Minerals (MULTIVITAMIN WITH MINERALS) tablet Take 1 tablet by mouth daily.       No current facility-administered medications on file prior to visit.      Review of Systems    see HPI Objective:   Physical Exam Physical Exam  Nursing note and vitals reviewed.  Constitutional: She is oriented to person, place, and time. She appears well-developed and well-nourished.  HENT:  Head: Normocephalic and atraumatic.  Cardiovascular: Normal rate and regular rhythm. Exam reveals no gallop and no friction rub.  No murmur heard.  Pulmonary/Chest: Breath sounds normal. She has no wheezes. She has no rales.  Careful palpation  Of L chest wall reveals no discrete mass..  Breast no discrete mass no axillary adenopathy no nipple discharge. Lymph  Thickening L clavicular mass at site of prior biopsy.  No R clavicular adenopathy.  No axillary no epitrochlear no inguinal nodes.    Neurological: She is  alert and oriented to person, place, and time.  Skin: Skin is warm and dry.  Psychiatric: She has a normal mood and affect. Her behavior is normal.  Ext:  No edema            Assessment & Plan:  Chest wall swelling  History of NHL:    Will get CT series.  Neck Chest, abd/pelvis    Check labs further management based on results

## 2012-08-20 ENCOUNTER — Ambulatory Visit (HOSPITAL_BASED_OUTPATIENT_CLINIC_OR_DEPARTMENT_OTHER)
Admission: RE | Admit: 2012-08-20 | Discharge: 2012-08-20 | Disposition: A | Discharge status: Home / Self Care | Payer: 59 | Source: Ambulatory Visit | Attending: Internal Medicine | Admitting: Internal Medicine

## 2012-08-20 DIAGNOSIS — C8590 Non-Hodgkin lymphoma, unspecified, unspecified site: Secondary | ICD-10-CM

## 2012-08-20 DIAGNOSIS — C8589 Other specified types of non-Hodgkin lymphoma, extranodal and solid organ sites: Secondary | ICD-10-CM | POA: Insufficient documentation

## 2012-08-20 MED ORDER — IOHEXOL 300 MG/ML  SOLN
100.0000 mL | Freq: Once | INTRAMUSCULAR | Status: AC | PRN
  Administered 2012-08-20: 100 mL via INTRAVENOUS

## 2012-08-27 LAB — CBC WITH DIFFERENTIAL/PLATELET
Basophils Absolute: 0 10*3/uL (ref 0.0–0.1)
Basophils Relative: 0 % (ref 0–1)
Eosinophils Absolute: 0.1 10*3/uL (ref 0.0–0.7)
Eosinophils Relative: 2 % (ref 0–5)
HCT: 35.9 % — ABNORMAL LOW (ref 36.0–46.0)
Lymphocytes Relative: 32 % (ref 12–46)
MCH: 26 pg (ref 26.0–34.0)
MCHC: 33.4 g/dL (ref 30.0–36.0)
MCV: 77.7 fL — ABNORMAL LOW (ref 78.0–100.0)
Monocytes Absolute: 0.4 10*3/uL (ref 0.1–1.0)
Platelets: 381 10*3/uL (ref 150–400)
RDW: 14.3 % (ref 11.5–15.5)
WBC: 5.7 10*3/uL (ref 4.0–10.5)

## 2012-08-27 LAB — COMPREHENSIVE METABOLIC PANEL
ALT: 39 U/L — ABNORMAL HIGH (ref 0–35)
AST: 27 U/L (ref 0–37)
BUN: 10 mg/dL (ref 6–23)
Calcium: 9.9 mg/dL (ref 8.4–10.5)
Chloride: 101 mEq/L (ref 96–112)
Creat: 0.77 mg/dL (ref 0.50–1.10)
Total Bilirubin: 0.4 mg/dL (ref 0.3–1.2)

## 2012-08-31 ENCOUNTER — Encounter: Payer: Self-pay | Admitting: *Deleted

## 2012-09-08 ENCOUNTER — Ambulatory Visit (INDEPENDENT_AMBULATORY_CARE_PROVIDER_SITE_OTHER): Payer: 59 | Admitting: Cardiology

## 2012-09-08 ENCOUNTER — Encounter: Payer: Self-pay | Admitting: Cardiology

## 2012-09-08 VITALS — BP 130/80 | HR 105 | Ht 59.0 in | Wt 152.0 lb

## 2012-09-08 DIAGNOSIS — I10 Essential (primary) hypertension: Secondary | ICD-10-CM

## 2012-09-08 DIAGNOSIS — R002 Palpitations: Secondary | ICD-10-CM

## 2012-09-08 DIAGNOSIS — R0789 Other chest pain: Secondary | ICD-10-CM

## 2012-09-08 NOTE — Patient Instructions (Addendum)
Your physician recommends that you schedule a follow-up appointment in: AS NEEDED  Your physician has requested that you have an echocardiogram. Echocardiography is a painless test that uses sound waves to create images of your heart. It provides your doctor with information about the size and shape of your heart and how well your heart's chambers and valves are working. This procedure takes approximately one hour. There are no restrictions for this procedure.  Your physician recommends that you HAVE LAB WORK TODAY

## 2012-09-08 NOTE — Assessment & Plan Note (Signed)
Symptoms atypical. Recent nuclear study negative. No further ischemia evaluation.

## 2012-09-08 NOTE — Progress Notes (Signed)
  HPI: 47 year old female for evaluation of chest pain. Nuclear study in February of 2014 showed normal perfusion and an ejection fraction of 52%. Chest CT in March of 2014 showed normal heart size and no pericardial effusion. Patient had chest pain in December and January. It was described as a pressure and substernal in location. No radiation or associated symptoms. Not pleuritic, positional, exertional or related to food. It would last 15 minutes and resolved. During these episodes she also had palpitations described as her heart racing. Her symptoms have much improved. She has not had any symptoms in the past 3 weeks. Because of the above we were asked to evaluate.  Current Outpatient Prescriptions  Medication Sig Dispense Refill  . amLODipine (NORVASC) 10 MG tablet Take 1 tablet (10 mg total) by mouth daily.  30 tablet  2  . calcium carbonate (TUMS) 500 MG chewable tablet One tablet 3 times daily as needed for reflux.      . Cholecalciferol (VITAMIN D) 2000 UNITS CAPS Take 1 capsule by mouth daily.        [name] lamoTRIgine (LAMICTAL) 100 MG tablet Take 100 mg by mouth 2 (two) times daily.        . Multiple Vitamins-Minerals (MULTIVITAMIN WITH MINERALS) tablet Take 1 tablet by mouth daily.      [name] esomeprazole (NEXIUM) 40 MG capsule Take 1 capsule (40 mg total) by mouth daily.  30 capsule  2   No current facility-administered medications for this visit.    No Known Allergies  Past Medical History  Diagnosis Date  . Non Hodgkin's lymphoma 2/08    stage III  . Obesity   . Seizure disorder   . Microscopic hematuria   . Hypertension     Past Surgical History  Procedure Laterality Date  . Cesarean section  1998, 2002  . Portacath placement  2008  . Port-a-cath removal  2009    History   Social History  . Marital Status: Married    Spouse Name: Greggory Stallion    Number of Children: 2  . Years of Education: college   Occupational History  . PHLEBOTOMIST    Social History Main Topics  .  Smoking status: Never Smoker   . Smokeless tobacco: Never Used  . Alcohol Use: No  . Drug Use: No  . Sexually Active: Yes -- Female partner(s)   Other Topics Concern  . Not on file   Social History Narrative  . No narrative on file    Family History  Problem Relation Age of Onset  . Breast cancer Mother   . Hypertension Mother   . Hypertension Father   . Hypertension Maternal Grandmother   . Hypertension Paternal Grandmother     ROS: no fevers or chills, productive cough, hemoptysis, dysphasia, odynophagia, melena, hematochezia, dysuria, hematuria, rash, seizure activity, orthopnea, PND, pedal edema, claudication. Remaining systems are negative.  Physical Exam:   Blood pressure 130/80, pulse 105, height 4\' 11"  (1.499 m), weight 152 lb (68.947 kg).  General:  Well developed/well nourished in NAD Skin warm/dry Patient not depressed No peripheral clubbing Back-normal HEENT-normal/normal eyelids Neck supple/normal carotid upstroke bilaterally; no bruits; no JVD; no thyromegaly chest - CTA/ normal expansion CV - RRR/normal S1 and S2; no murmurs, rubs or gallops;  PMI nondisplaced Abdomen -NT/ND, no HSM, no mass, + bowel sounds, no bruit 2+ femoral pulses, no bruits Ext-no edema, chords, 2+ DP Neuro-grossly nonfocal  ECG 06/22/2012-sinus rhythm with no ST changes.

## 2012-09-08 NOTE — Assessment & Plan Note (Signed)
Continue present medications. We could consider changing from Norvasc to a beta blocker in the future if her palpitations worsen.

## 2012-09-08 NOTE — Assessment & Plan Note (Signed)
Patient symptoms are improving. We will plan observation for now. If she develops recurrent palpitations we will place a monitor. Check echocardiogram and TSH.

## 2012-09-17 ENCOUNTER — Ambulatory Visit (HOSPITAL_COMMUNITY): Payer: 59 | Attending: Cardiology | Admitting: Radiology

## 2012-09-17 DIAGNOSIS — R079 Chest pain, unspecified: Secondary | ICD-10-CM | POA: Insufficient documentation

## 2012-09-17 DIAGNOSIS — R072 Precordial pain: Secondary | ICD-10-CM

## 2012-09-17 DIAGNOSIS — R002 Palpitations: Secondary | ICD-10-CM | POA: Insufficient documentation

## 2012-09-17 NOTE — Progress Notes (Signed)
Echocardiogram performed.  

## 2012-10-01 ENCOUNTER — Other Ambulatory Visit: Payer: Self-pay | Admitting: Family

## 2012-10-01 ENCOUNTER — Telehealth: Payer: Self-pay | Admitting: *Deleted

## 2012-10-01 NOTE — Telephone Encounter (Signed)
Message copied by Monico Blitz on Fri Oct 01, 2012  2:30 PM ------      Message from: Richrd Prime      Created: Fri Oct 01, 2012  2:18 PM      Contact: patient       Patient calling to tell us she has ran out of her Lamotrigine 100 mg.  She wants to know if she can get a refill without coming back for a f/u appointment. She can be reached at [phone]             ------

## 2012-10-06 ENCOUNTER — Telehealth: Payer: Self-pay | Admitting: Neurology

## 2012-10-06 MED ORDER — LAMOTRIGINE 100 MG PO TABS: 100.0000 mg | ORAL_TABLET | Freq: Two times a day (BID) | ORAL | Status: DC

## 2012-10-06 NOTE — Telephone Encounter (Signed)
I will send one refill and note that patient needs to schedule appt.  She was last seen Feb 2013.  Called patient, got no answer.

## 2012-10-07 ENCOUNTER — Other Ambulatory Visit: Payer: Self-pay | Admitting: Neurology

## 2012-10-08 ENCOUNTER — Ambulatory Visit (INDEPENDENT_AMBULATORY_CARE_PROVIDER_SITE_OTHER): Payer: 59 | Admitting: Family

## 2012-10-08 ENCOUNTER — Telehealth: Payer: Self-pay | Admitting: Neurology

## 2012-10-08 ENCOUNTER — Encounter: Payer: Self-pay | Admitting: Family

## 2012-10-08 VITALS — BP 124/70 | HR 98 | Temp 98.8°F | Resp 16 | Wt 149.1 lb

## 2012-10-08 DIAGNOSIS — R0789 Other chest pain: Secondary | ICD-10-CM

## 2012-10-08 DIAGNOSIS — Z8669 Personal history of other diseases of the nervous system and sense organs: Secondary | ICD-10-CM

## 2012-10-08 DIAGNOSIS — I10 Essential (primary) hypertension: Secondary | ICD-10-CM

## 2012-10-08 MED ORDER — LAMOTRIGINE 100 MG PO TABS: 100.0000 mg | ORAL_TABLET | Freq: Two times a day (BID) | ORAL | Status: DC

## 2012-10-08 NOTE — Assessment & Plan Note (Signed)
Stable. Management per neurology.  °

## 2012-10-08 NOTE — Assessment & Plan Note (Signed)
Resolved.  Work up negative. Denies further palpitations.

## 2012-10-08 NOTE — Assessment & Plan Note (Addendum)
BP is stable on amlodipine.  Continue same. I recommended a 3 month follow up nurse visit for BP check and a 6 month provider visit.

## 2012-10-08 NOTE — Progress Notes (Signed)
  Subjective:    Patient ID: Tiffany [name], female    DOB: [birth year], 47 y.o.   MRN: [id number]  HPI  Pt presents today for follow up of her blood pressure. She continues amlodipine 10mg .  Tolerating with out side effects.  Atypical Chest Pain- Since last visit she has seen cardiology and underwent a stress test which was negative. Denis CP, SOB or swelling.    Seizure disorder- she follows with Dr. Pearlean Brownie and reports that this has been well controlled on lamictal.      Review of Systems    see HPI  Past Medical History  Diagnosis Date  . Non Hodgkin's lymphoma 2/08    stage III  . Obesity   . Seizure disorder   . Microscopic hematuria   . Hypertension     History   Social History  . Marital Status: Married    Spouse Name: Greggory Stallion    Number of Children: 2  . Years of Education: college   Occupational History  . PHLEBOTOMIST    Social History Main Topics  . Smoking status: Never Smoker   . Smokeless tobacco: Never Used  . Alcohol Use: No  . Drug Use: No  . Sexually Active: Yes -- Female partner(s)   Other Topics Concern  . Not on file   Social History Narrative  . No narrative on file    Past Surgical History  Procedure Laterality Date  . Cesarean section  1998, 2002  . Portacath placement  2008  . Port-a-cath removal  2009    Family History  Problem Relation Age of Onset  . Breast cancer Mother   . Hypertension Mother   . Hypertension Father   . Hypertension Maternal Grandmother   . Hypertension Paternal Grandmother     No Known Allergies  Current Outpatient Prescriptions on File Prior to Visit  Medication Sig Dispense Refill  . amLODipine (NORVASC) 10 MG tablet TAKE 1 TABLET BY MOUTH DAILY.  30 tablet  2  . calcium carbonate (TUMS) 500 MG chewable tablet One tablet 3 times daily as needed for reflux.      . Cholecalciferol (VITAMIN D) 2000 UNITS CAPS Take 1 capsule by mouth daily.        [name] esomeprazole (NEXIUM) 40 MG capsule Take 1  capsule (40 mg total) by mouth daily.  30 capsule  2  . Multiple Vitamins-Minerals (MULTIVITAMIN WITH MINERALS) tablet Take 1 tablet by mouth daily.       No current facility-administered medications on file prior to visit.    BP 124/70  Pulse 98  Temp(Src) 98.8 F (37.1 C) (Oral)  Resp 16  Wt 149 lb 1.9 oz (67.64 kg)  BMI 30.1 kg/m2  SpO2 98%    Objective:   Physical Exam  Constitutional: She is oriented to person, place, and time. She appears well-developed and well-nourished. No distress.  HENT:  Head: Normocephalic and atraumatic.  Cardiovascular: Normal rate and regular rhythm.   No murmur heard. Pulmonary/Chest: Effort normal and breath sounds normal. No respiratory distress. She has no wheezes. She has no rales. She exhibits no tenderness.  Musculoskeletal: She exhibits no edema.  Neurological: She is alert and oriented to person, place, and time.  Skin: Skin is warm and dry.  Psychiatric: She has a normal mood and affect. Her behavior is normal. Judgment and thought content normal.          Assessment & Plan:

## 2012-10-08 NOTE — Telephone Encounter (Signed)
Refill was already sent to the pharmacy on 04/30, noting appt is needed.  I called patient back that day and got no answer.  I called the patient again today.  Got no answer.  Left message rx was already sent on the 30th and she will need to schedule appt.  I will resend Rx again since patient states pharmacy says they do not have it.

## 2012-10-22 ENCOUNTER — Other Ambulatory Visit: Payer: Self-pay | Admitting: Internal Medicine

## 2012-10-22 ENCOUNTER — Ambulatory Visit (HOSPITAL_BASED_OUTPATIENT_CLINIC_OR_DEPARTMENT_OTHER)
Admission: RE | Admit: 2012-10-22 | Discharge: 2012-10-22 | Disposition: A | Discharge status: Home / Self Care | Payer: 59 | Source: Ambulatory Visit | Attending: Internal Medicine | Admitting: Internal Medicine

## 2012-10-22 DIAGNOSIS — Z1231 Encounter for screening mammogram for malignant neoplasm of breast: Secondary | ICD-10-CM

## 2012-10-24 ENCOUNTER — Encounter: Payer: Self-pay | Admitting: Internal Medicine

## 2012-10-28 ENCOUNTER — Ambulatory Visit (INDEPENDENT_AMBULATORY_CARE_PROVIDER_SITE_OTHER): Payer: 59 | Admitting: Family Medicine

## 2012-10-28 ENCOUNTER — Encounter: Payer: Self-pay | Admitting: Family Medicine

## 2012-10-28 VITALS — BP 120/82 | HR 99 | Temp 98.7°F | Ht 59.0 in | Wt 143.0 lb

## 2012-10-28 DIAGNOSIS — I10 Essential (primary) hypertension: Secondary | ICD-10-CM

## 2012-10-28 DIAGNOSIS — J029 Acute pharyngitis, unspecified: Secondary | ICD-10-CM

## 2012-10-28 MED ORDER — LIDOCAINE VISCOUS 2 % MT SOLN: 20.0000 mL | OROMUCOSAL | Status: DC | PRN

## 2012-10-28 MED ORDER — AMOXICILLIN 500 MG PO CAPS: 500.0000 mg | ORAL_CAPSULE | Freq: Three times a day (TID) | ORAL | Status: DC

## 2012-10-28 NOTE — Patient Instructions (Addendum)

## 2012-10-28 NOTE — Assessment & Plan Note (Signed)
Well controlled despite illness

## 2012-10-28 NOTE — Assessment & Plan Note (Signed)
Appears viral, strep culture taken results pending. Patient in possession of Amoxicillin rx to fill if culture positive or worsening symptoms develop such as hi grade fever swelling develop. Given viscous lidocaine to use 1 tsp swish and spit 4 x daily as needed for pain, encouraged probiotics, increased rest, hydration and a zinc product.

## 2012-10-28 NOTE — Progress Notes (Signed)
Patient ID: Tiffany [name], female   DOB: July 08, 1965, 47 y.o.   MRN: [id number] Tiffany [name] [id number] 1965/12/30 10/28/2012      Progress Note-Follow Up  Subjective  Chief Complaint  Chief Complaint  Patient presents with  . Sore Throat    and hoarse    HPI  Patient is a 47 year old American female who is in today with several days worth of scratchy throat and some hoarseness. Then today she developed severe pain in her throat. Has been kept up for several hours. Hurts with swallowing but also hurts constantly. No fevers or chills. There is some fatigue and malaise but no complaints of myalgias, chest pain, shortness of breath major congestion GI or GU concerns are noted.  Past Medical History  Diagnosis Date  . Non Hodgkin's lymphoma 2/08    stage III  . Obesity   . Seizure disorder   . Microscopic hematuria   . Hypertension   . Acute pharyngitis 10/28/2012    Past Surgical History  Procedure Laterality Date  . Cesarean section  1998, 2002  . Portacath placement  2008  . Port-a-cath removal  2009    Family History  Problem Relation Age of Onset  . Breast cancer Mother   . Hypertension Mother   . Hypertension Father   . Hypertension Maternal Grandmother   . Hypertension Paternal Grandmother     History   Social History  . Marital Status: Married    Spouse Name: Greggory Stallion    Number of Children: 2  . Years of Education: college   Occupational History  . PHLEBOTOMIST    Social History Main Topics  . Smoking status: Never Smoker   . Smokeless tobacco: Never Used  . Alcohol Use: No  . Drug Use: No  . Sexually Active: Yes -- Female partner(s)   Other Topics Concern  . Not on file   Social History Narrative  . No narrative on file    Current Outpatient Prescriptions on File Prior to Visit  Medication Sig Dispense Refill  . amLODipine (NORVASC) 10 MG tablet TAKE 1 TABLET BY MOUTH DAILY.  30 tablet  2  . calcium carbonate (TUMS) 500 MG  chewable tablet One tablet 3 times daily as needed for reflux.      . Cholecalciferol (VITAMIN D) 2000 UNITS CAPS Take 1 capsule by mouth daily.        [name] esomeprazole (NEXIUM) 40 MG capsule Take 1 capsule (40 mg total) by mouth daily.  30 capsule  2  . lamoTRIgine (LAMICTAL) 100 MG tablet Take 1 tablet (100 mg total) by mouth 2 (two) times daily.  60 tablet  0  . Multiple Vitamins-Minerals (MULTIVITAMIN WITH MINERALS) tablet Take 1 tablet by mouth daily.       No current facility-administered medications on file prior to visit.    No Known Allergies  Review of Systems  Review of Systems  Constitutional: Positive for malaise/fatigue. Negative for fever.  HENT: Positive for sore throat. Negative for congestion.   Eyes: Negative for discharge.  Respiratory: Negative for shortness of breath.   Cardiovascular: Negative for chest pain, palpitations and leg swelling.  Gastrointestinal: Negative for nausea, abdominal pain and diarrhea.  Genitourinary: Negative for dysuria.  Musculoskeletal: Negative for falls.  Skin: Negative for rash.  Neurological: Negative for loss of consciousness and headaches.  Endo/Heme/Allergies: Negative for polydipsia.  Psychiatric/Behavioral: Negative for depression and suicidal ideas. The patient is not nervous/anxious and does not have insomnia.     Objective  BP 120/82  Pulse 99  Temp(Src) 98.7 F (37.1 C) (Oral)  Ht 4\' 11"  (1.499 m)  Wt 143 lb (64.864 kg)  BMI 28.87 kg/m2  SpO2 96%  Physical Exam  Physical Exam  Constitutional: She is oriented to person, place, and time and well-developed, well-nourished, and in no distress. No distress.  HENT:  Head: Normocephalic and atraumatic.  Oropharynx mildly erythematous, no pustules or ulcers. No edema.   Eyes: Conjunctivae are normal.  Neck: Neck supple. No thyromegaly present.  Cardiovascular: Normal rate, regular rhythm and normal heart sounds.   No murmur heard. Pulmonary/Chest: Effort normal and  breath sounds normal. She has no wheezes.  Abdominal: She exhibits no distension and no mass.  Musculoskeletal: She exhibits no edema.  Lymphadenopathy:    She has no cervical adenopathy.  Neurological: She is alert and oriented to person, place, and time.  Skin: Skin is warm and dry. No rash noted. She is not diaphoretic.  Psychiatric: Memory, affect and judgment normal.    Lab Results  Component Value Date   TSH 0.770 09/08/2012   Lab Results  Component Value Date   WBC 5.7 08/27/2012   HGB 12.0 08/27/2012   HCT 35.9* 08/27/2012   MCV 77.7* 08/27/2012   PLT 381 08/27/2012   Lab Results  Component Value Date   CREATININE 0.77 08/27/2012   BUN 10 08/27/2012   NA 140 08/27/2012   K 4.4 08/27/2012   CL 101 08/27/2012   CO2 31 08/27/2012   Lab Results  Component Value Date   ALT 39* 08/27/2012   AST 27 08/27/2012   ALKPHOS 106 08/27/2012   BILITOT 0.4 08/27/2012   Lab Results  Component Value Date   CHOL 152 11/12/2011   Lab Results  Component Value Date   HDL 54 11/12/2011   Lab Results  Component Value Date   LDLCALC 85 11/12/2011   Lab Results  Component Value Date   TRIG 63 11/12/2011   Lab Results  Component Value Date   CHOLHDL 2.8 11/12/2011     Assessment & Plan  HTN (hypertension) Well controlled despite illness  Acute pharyngitis Appears viral, strep culture taken results pending. Patient in possession of Amoxicillin rx to fill if culture positive or worsening symptoms develop such as hi grade fever swelling develop. Given viscous lidocaine to use 1 tsp swish and spit 4 x daily as needed for pain, encouraged probiotics, increased rest, hydration and a zinc product.

## 2012-11-02 NOTE — Progress Notes (Signed)
Quick Note:  Patient Informed and voiced understanding ______ 

## 2012-11-10 ENCOUNTER — Telehealth: Payer: Self-pay | Admitting: Nurse Practitioner

## 2012-11-10 MED ORDER — LAMOTRIGINE 100 MG PO TABS: 100.0000 mg | ORAL_TABLET | Freq: Two times a day (BID) | ORAL | Status: DC

## 2012-11-10 NOTE — Telephone Encounter (Signed)
Rx has been sent. Called patient, no answer.

## 2012-11-10 NOTE — Telephone Encounter (Signed)
Patient is calling to tell us she doesn't have an appt with Darrol Angel until August.  She needs a refill on her LAMICTAL, she will run out before the appt.  Please call the patient at [phone] (cell phone) asap.

## 2012-12-14 ENCOUNTER — Ambulatory Visit (INDEPENDENT_AMBULATORY_CARE_PROVIDER_SITE_OTHER): Payer: 59 | Admitting: Family Medicine

## 2012-12-14 ENCOUNTER — Ambulatory Visit (HOSPITAL_BASED_OUTPATIENT_CLINIC_OR_DEPARTMENT_OTHER)
Admission: RE | Admit: 2012-12-14 | Discharge: 2012-12-14 | Disposition: A | Discharge status: Home / Self Care | Payer: 59 | Source: Ambulatory Visit | Attending: Family Medicine | Admitting: Family Medicine

## 2012-12-14 ENCOUNTER — Encounter: Payer: Self-pay | Admitting: Family Medicine

## 2012-12-14 ENCOUNTER — Encounter (HOSPITAL_BASED_OUTPATIENT_CLINIC_OR_DEPARTMENT_OTHER): Payer: Self-pay

## 2012-12-14 VITALS — BP 108/78 | HR 91 | Temp 98.5°F | Ht 59.0 in | Wt 150.0 lb

## 2012-12-14 DIAGNOSIS — M542 Cervicalgia: Secondary | ICD-10-CM

## 2012-12-14 DIAGNOSIS — C8589 Other specified types of non-Hodgkin lymphoma, extranodal and solid organ sites: Secondary | ICD-10-CM

## 2012-12-14 DIAGNOSIS — R220 Localized swelling, mass and lump, head: Secondary | ICD-10-CM | POA: Insufficient documentation

## 2012-12-14 DIAGNOSIS — C8590 Non-Hodgkin lymphoma, unspecified, unspecified site: Secondary | ICD-10-CM

## 2012-12-14 DIAGNOSIS — I10 Essential (primary) hypertension: Secondary | ICD-10-CM

## 2012-12-14 DIAGNOSIS — Z87898 Personal history of other specified conditions: Secondary | ICD-10-CM | POA: Insufficient documentation

## 2012-12-14 LAB — CBC
HCT: 37.6 % (ref 36.0–46.0)
MCH: 26.1 pg (ref 26.0–34.0)
MCV: 78 fL (ref 78.0–100.0)
Platelets: 354 10*3/uL (ref 150–400)
WBC: 5 10*3/uL (ref 4.0–10.5)

## 2012-12-14 MED ORDER — IOHEXOL 300 MG/ML  SOLN
75.0000 mL | Freq: Once | INTRAMUSCULAR | Status: AC | PRN
  Administered 2012-12-14: 75 mL via INTRAVENOUS

## 2012-12-14 MED ORDER — METHYLPREDNISOLONE 4 MG PO KIT: PACK | ORAL | Status: DC

## 2012-12-14 NOTE — Progress Notes (Signed)
Patient ID: Tiffany [name], female   DOB: Apr 13, 1966, 47 y.o.   MRN: [id number] Tiffany [name] [id number] [birth year] 12/14/2012      Progress Note-Follow Up  Subjective  Chief Complaint  Chief Complaint  Patient presents with  . Adenopathy    swollen glands in neck- first noticed on Saturday    HPI  Patient is a 47 year old American female who was present for roughly 3 days with increased swelling in her throat. She had non-Hodgkin's lymphoma years ago and in the last 2 days has felt as if she has tightness and swelling in her throat similar to what she experienced when she was diagnosed 5 years ago. She denies fevers, chills, malaise, myalgias, headaches, chest pain, palpitations, shortness of breath or concerns of systemic disease otherwise. No sore throat or congestion. No ear pain or cough  Past Medical History  Diagnosis Date  . Non Hodgkin's lymphoma 2/08    stage III  . Obesity   . Seizure disorder   . Microscopic hematuria   . Hypertension   . Acute pharyngitis 10/28/2012    Past Surgical History  Procedure Laterality Date  . Cesarean section  1998, 2002  . Portacath placement  2008  . Port-a-cath removal  2009    Family History  Problem Relation Age of Onset  . Breast cancer Mother   . Hypertension Mother   . Hypertension Father   . Hypertension Maternal Grandmother   . Hypertension Paternal Grandmother     History   Social History  . Marital Status: Married    Spouse Name: Greggory Stallion    Number of Children: 2  . Years of Education: college   Occupational History  . PHLEBOTOMIST    Social History Main Topics  . Smoking status: Never Smoker   . Smokeless tobacco: Never Used  . Alcohol Use: No  . Drug Use: No  . Sexually Active: Yes -- Female partner(s)   Other Topics Concern  . Not on file   Social History Narrative  . No narrative on file    Current Outpatient Prescriptions on File Prior to Visit  Medication Sig Dispense Refill  .  amLODipine (NORVASC) 10 MG tablet TAKE 1 TABLET BY MOUTH DAILY.  30 tablet  2  . amoxicillin (AMOXIL) 500 MG capsule Take 1 capsule (500 mg total) by mouth 3 (three) times daily.  30 capsule  0  . calcium carbonate (TUMS) 500 MG chewable tablet One tablet 3 times daily as needed for reflux.      . Cholecalciferol (VITAMIN D) 2000 UNITS CAPS Take 1 capsule by mouth daily.        [name] esomeprazole (NEXIUM) 40 MG capsule Take 1 capsule (40 mg total) by mouth daily.  30 capsule  2  . lamoTRIgine (LAMICTAL) 100 MG tablet Take 1 tablet (100 mg total) by mouth 2 (two) times daily.  60 tablet  2  . lidocaine (XYLOCAINE) 2 % solution Take 20 mLs by mouth as needed for pain.  100 mL  0  . Multiple Vitamins-Minerals (MULTIVITAMIN WITH MINERALS) tablet Take 1 tablet by mouth daily.       No current facility-administered medications on file prior to visit.    No Known Allergies  Review of Systems  Review of Systems  Constitutional: Negative for fever and malaise/fatigue.  HENT: Positive for sore throat. Negative for congestion.   Eyes: Negative for pain and discharge.  Respiratory: Negative for shortness of breath.   Cardiovascular: Negative for chest pain, palpitations and  leg swelling.  Gastrointestinal: Negative for nausea, abdominal pain and diarrhea.  Genitourinary: Negative for dysuria.  Musculoskeletal: Negative for falls.  Skin: Negative for rash.  Neurological: Negative for loss of consciousness and headaches.  Endo/Heme/Allergies: Negative for polydipsia.  Psychiatric/Behavioral: Negative for depression and suicidal ideas. The patient is not nervous/anxious and does not have insomnia.     Objective  BP 108/78  Pulse 91  Temp(Src) 98.5 F (36.9 C) (Oral)  Ht 4\' 11"  (1.499 m)  Wt 150 lb 0.6 oz (68.058 kg)  BMI 30.29 kg/m2  SpO2 97%  Physical Exam  Physical Exam  Constitutional: She is oriented to person, place, and time and well-developed, well-nourished, and in no distress. No  distress.  HENT:  Head: Normocephalic and atraumatic.  Eyes: Conjunctivae are normal.  Neck: Neck supple. No thyromegaly present.  Anterior l>r  Cardiovascular: Normal rate and regular rhythm.  Exam reveals no gallop.   No murmur heard. Pulmonary/Chest: Effort normal and breath sounds normal. She has no wheezes.  Abdominal: She exhibits no distension and no mass.  Musculoskeletal: She exhibits no edema.  Lymphadenopathy:    She has cervical adenopathy.  Neurological: She is alert and oriented to person, place, and time.  Skin: Skin is warm and dry. No rash noted. She is not diaphoretic.  Psychiatric: Memory, affect and judgment normal.    Lab Results  Component Value Date   TSH 0.770 09/08/2012   Lab Results  Component Value Date   WBC 5.0 12/14/2012   HGB 12.6 12/14/2012   HCT 37.6 12/14/2012   MCV 78.0 12/14/2012   PLT 354 12/14/2012   Lab Results  Component Value Date   CREATININE 0.77 08/27/2012   BUN 10 08/27/2012   NA 140 08/27/2012   K 4.4 08/27/2012   CL 101 08/27/2012   CO2 31 08/27/2012   Lab Results  Component Value Date   ALT 39* 08/27/2012   AST 27 08/27/2012   ALKPHOS 106 08/27/2012   BILITOT 0.4 08/27/2012   Lab Results  Component Value Date   CHOL 152 11/12/2011   Lab Results  Component Value Date   HDL 54 11/12/2011   Lab Results  Component Value Date   LDLCALC 85 11/12/2011   Lab Results  Component Value Date   TRIG 63 11/12/2011   Lab Results  Component Value Date   CHOLHDL 2.8 11/12/2011     Assessment & Plan  HTN (hypertension) Well controlled, no changes today  Non Hodgkin's lymphoma Patient concern about increased level of lymphadenopathy and fullness in throat. CT scan showed stabel lymphadenopathy, will try a course of steroids to see if this helps her swelling.

## 2012-12-14 NOTE — Progress Notes (Signed)
Patient informed and given a copy of results

## 2012-12-14 NOTE — Assessment & Plan Note (Signed)
Patient concern about increased level of lymphadenopathy and fullness in throat. CT scan showed stabel lymphadenopathy, will try a course of steroids to see if this helps her swelling.

## 2012-12-14 NOTE — Assessment & Plan Note (Signed)
Well controlled, no changes today 

## 2012-12-27 ENCOUNTER — Other Ambulatory Visit: Payer: Self-pay | Admitting: Family

## 2012-12-27 NOTE — Telephone Encounter (Signed)
Rx request to pharmacy/[facility]  

## 2013-01-27 ENCOUNTER — Encounter: Payer: Self-pay | Admitting: Nurse Practitioner

## 2013-01-27 ENCOUNTER — Ambulatory Visit (INDEPENDENT_AMBULATORY_CARE_PROVIDER_SITE_OTHER): Payer: 59 | Admitting: Nurse Practitioner

## 2013-01-27 VITALS — BP 115/72 | HR 60 | Ht 60.0 in | Wt 151.0 lb

## 2013-01-27 DIAGNOSIS — G40909 Epilepsy, unspecified, not intractable, without status epilepticus: Secondary | ICD-10-CM

## 2013-01-27 DIAGNOSIS — G40309 Generalized idiopathic epilepsy and epileptic syndromes, not intractable, without status epilepticus: Secondary | ICD-10-CM | POA: Insufficient documentation

## 2013-01-27 MED ORDER — LAMOTRIGINE 100 MG PO TABS: 100.0000 mg | ORAL_TABLET | Freq: Two times a day (BID) | ORAL | Status: DC

## 2013-01-27 NOTE — Patient Instructions (Addendum)
Continue Lamictal 100 mg twice daily Will renew for the next year Followup yearly and when necessary

## 2013-01-27 NOTE — Progress Notes (Signed)
  Reason for visit followup for seizure HPI: Ms Lingle , 47 year old returns for follow up after her last visit 08/04/2011  She has not had any seizures. She has tapered and discontinued Dilantin. She is tolerating Lamictal without difficulty.  The MRI brain w/wo  on 05/10/09 at [facility] which was normal. EEG 03/26/09 performed in awake and drowsy states was normal as well. She has no new complaints today.    ROS:  Negative   Medications Current Outpatient Prescriptions on File Prior to Visit  Medication Sig Dispense Refill  . amLODipine (NORVASC) 10 MG tablet TAKE 1 TABLET BY MOUTH DAILY.  30 tablet  2  . amoxicillin (AMOXIL) 500 MG capsule Take 1 capsule (500 mg total) by mouth 3 (three) times daily.  30 capsule  0  . calcium carbonate (TUMS) 500 MG chewable tablet One tablet 3 times daily as needed for reflux.      . Cholecalciferol (VITAMIN D) 2000 UNITS CAPS Take 1 capsule by mouth daily.        [name] esomeprazole (NEXIUM) 40 MG capsule Take 1 capsule (40 mg total) by mouth daily.  30 capsule  2  . lamoTRIgine (LAMICTAL) 100 MG tablet Take 1 tablet (100 mg total) by mouth 2 (two) times daily.  60 tablet  2  . lidocaine (XYLOCAINE) 2 % solution Take 20 mLs by mouth as needed for pain.  100 mL  0  . methylPREDNISolone (MEDROL, PAK,) 4 MG tablet follow package directions  21 tablet  0  . Multiple Vitamins-Minerals (MULTIVITAMIN WITH MINERALS) tablet Take 1 tablet by mouth daily.       No current facility-administered medications on file prior to visit.    Allergies No Known Allergies  Physical Exam General: well developed, well nourished, seated, in no evident distress   Neurologic Exam Mental Status: Awake and fully alert. Oriented to place and time. Follows all commands. Speech and language normal.   Cranial Nerves:. Pupils equal, briskly reactive to light. Extraocular movements full without nystagmus. Visual fields full to confrontation. Hearing intact and symmetric  to finger snap. Facial sensation intact. Face, tongue, palate move normally and symmetrically. Neck flexion and extension normal.  Motor: Normal bulk and tone. Normal strength in all tested extremity muscles.No focal weakness Sensory.: intact to touch and pinprick and vibratory.  Coordination: Rapid alternating movements normal in all extremities. Finger-to-nose and heel-to-shin performed accurately bilaterally. No dysmetria Gait and Station: Arises from chair without difficulty. Stance is normal.  Able to heel, toe and tandem walk without difficulty.  Reflexes: 2+ and symmetric. Toes downgoing.     ASSESSMENT: Generalized seizure disorder in good control. Last seizure 3 years ago. Currently on Lamictal 100 twice daily     PLAN: Continue Lamictal 100 mg twice daily Will renew for the next year Followup yearly and when necessary  Nilda Riggs, GNP-BC APRN

## 2013-03-25 ENCOUNTER — Other Ambulatory Visit: Payer: Self-pay | Admitting: Family

## 2013-03-25 NOTE — Telephone Encounter (Signed)
Rx request to pharmacy/[facility]  

## 2013-04-06 ENCOUNTER — Encounter: Payer: Self-pay | Admitting: Internal Medicine

## 2013-04-06 ENCOUNTER — Ambulatory Visit (INDEPENDENT_AMBULATORY_CARE_PROVIDER_SITE_OTHER): Payer: 59 | Admitting: Internal Medicine

## 2013-04-06 VITALS — BP 104/70 | HR 88 | Temp 98.1°F | Resp 16 | Wt 156.0 lb

## 2013-04-06 DIAGNOSIS — R209 Unspecified disturbances of skin sensation: Secondary | ICD-10-CM

## 2013-04-06 DIAGNOSIS — Z23 Encounter for immunization: Secondary | ICD-10-CM

## 2013-04-06 DIAGNOSIS — R200 Anesthesia of skin: Secondary | ICD-10-CM

## 2013-04-06 DIAGNOSIS — G4700 Insomnia, unspecified: Secondary | ICD-10-CM

## 2013-04-06 DIAGNOSIS — Z139 Encounter for screening, unspecified: Secondary | ICD-10-CM

## 2013-04-06 LAB — CBC WITH DIFFERENTIAL/PLATELET
Basophils Absolute: 0 10*3/uL (ref 0.0–0.1)
Basophils Relative: 0 % (ref 0–1)
Eosinophils Absolute: 0.1 10*3/uL (ref 0.0–0.7)
Eosinophils Relative: 2 % (ref 0–5)
HCT: 36.6 % (ref 36.0–46.0)
MCH: 25.7 pg — ABNORMAL LOW (ref 26.0–34.0)
MCHC: 33.1 g/dL (ref 30.0–36.0)
MCV: 77.9 fL — ABNORMAL LOW (ref 78.0–100.0)
Monocytes Absolute: 0.6 10*3/uL (ref 0.1–1.0)
Platelets: 381 10*3/uL (ref 150–400)
RDW: 13.9 % (ref 11.5–15.5)
WBC: 7.4 10*3/uL (ref 4.0–10.5)

## 2013-04-06 LAB — GLUCOSE, POCT (MANUAL RESULT ENTRY): POC Glucose: 93 mg/dl (ref 70–99)

## 2013-04-06 MED ORDER — ESZOPICLONE 2 MG PO TABS: ORAL_TABLET | ORAL | Status: DC

## 2013-04-06 NOTE — Progress Notes (Signed)
Subjective:    Patient ID: Tiffany [name], female    DOB: 10-31-1965, 47 y.o.   MRN: [id number]  HPI  Tiffany [name] reports that her L great toe has been numb on the medial side.  She had new shoes that she wore for 2 weeks that did cause pressure on that part of her toe.  Seh was worried about diabetes  Seh also has worsening insomnia  Wakes up every 2 hours.  No particularly anxious, worried or depressed  No Known Allergies Past Medical History  Diagnosis Date  . Non Hodgkin's lymphoma 2/08    stage III  . Obesity   . Seizure disorder   . Microscopic hematuria   . Hypertension   . Acute pharyngitis 10/28/2012   Past Surgical History  Procedure Laterality Date  . Cesarean section  1998, 2002  . Portacath placement  2008  . Port-a-cath removal  2009   History   Social History  . Marital Status: Married    Spouse Name: Greggory Stallion    Number of Children: 2  . Years of Education: college   Occupational History  . PHLEBOTOMIST    Social History Main Topics  . Smoking status: Never Smoker   . Smokeless tobacco: Never Used  . Alcohol Use: No  . Drug Use: No  . Sexual Activity: Yes    Partners: Male   Other Topics Concern  . Not on file   Social History Narrative  . No narrative on file   Family History  Problem Relation Age of Onset  . Breast cancer Mother   . Hypertension Mother   . Hypertension Father   . Hypertension Maternal Grandmother   . Hypertension Paternal Grandmother    Patient Active Problem List   Diagnosis Date Noted  . Seizure disorder, primary generalized 01/27/2013  . Acute pharyngitis 10/28/2012  . Palpitations 09/08/2012  . HTN (hypertension) 06/22/2012  . Atypical chest pain 12/08/2011  . Non Hodgkin's lymphoma 11/12/2011  . Family history of breast cancer 11/12/2011  . History of seizure disorder 11/12/2011  . Hematuria 02/13/2011   Current Outpatient Prescriptions on File Prior to Visit  Medication Sig Dispense Refill  . amLODipine  (NORVASC) 10 MG tablet TAKE 1 TABLET BY MOUTH DAILY.  30 tablet  2  . calcium carbonate (TUMS) 500 MG chewable tablet One tablet 3 times daily as needed for reflux.      . Cholecalciferol (VITAMIN D) 2000 UNITS CAPS Take 1 capsule by mouth daily.        [name] lamoTRIgine (LAMICTAL) 100 MG tablet Take 1 tablet (100 mg total) by mouth 2 (two) times daily.  60 tablet  11  . Multiple Vitamins-Minerals (MULTIVITAMIN WITH MINERALS) tablet Take 1 tablet by mouth daily.       No current facility-administered medications on file prior to visit.      Review of Systems    see HPI Objective:   Physical Exam Physical Exam  Nursing note and vitals reviewed.  Constitutional: She is oriented to person, place, and time. She appears well-developed and well-nourished.  HENT:  Head: Normocephalic and atraumatic.  Cardiovascular: Normal rate and regular rhythm. Exam reveals no gallop and no friction rub.  No murmur heard.  Pulmonary/Chest: Breath sounds normal. She has no wheezes. She has no rales.  Neurological: She is alert and oriented to person, place, and time.  Skin: Skin is warm and dry. Ext  Good N-V status  Normal monofilament.  She is tender over L great toe medial  side  No redness   Psychiatric: She has a normal mood and affect. Her behavior is normal.             Assessment & Plan:  L toe pain likely pressure injury to digital nerve  Will watch for now  Check labs  B12  Insomnia OK for lunesta 2-3 times per week

## 2013-04-07 ENCOUNTER — Encounter: Payer: Self-pay | Admitting: Internal Medicine

## 2013-04-07 LAB — COMPREHENSIVE METABOLIC PANEL
ALT: 20 U/L (ref 0–35)
AST: 20 U/L (ref 0–37)
Alkaline Phosphatase: 92 U/L (ref 39–117)
CO2: 29 mEq/L (ref 19–32)
Sodium: 138 mEq/L (ref 135–145)
Total Bilirubin: 0.5 mg/dL (ref 0.3–1.2)
Total Protein: 7.4 g/dL (ref 6.0–8.3)

## 2013-04-07 LAB — LIPID PANEL
LDL Cholesterol: 77 mg/dL (ref 0–99)
VLDL: 15 mg/dL (ref 0–40)

## 2013-05-09 ENCOUNTER — Encounter: Payer: Self-pay | Admitting: *Deleted

## 2013-06-24 ENCOUNTER — Other Ambulatory Visit: Payer: Self-pay | Admitting: Family

## 2013-06-24 ENCOUNTER — Other Ambulatory Visit: Payer: Self-pay | Admitting: Internal Medicine

## 2013-06-27 NOTE — Telephone Encounter (Signed)
Ok to call in

## 2013-06-27 NOTE — Telephone Encounter (Signed)
Called in [place] to [place]

## 2013-06-27 NOTE — Telephone Encounter (Signed)
Refill request

## 2013-07-28 NOTE — Telephone Encounter (Signed)
Closing encounter

## 2013-08-17 ENCOUNTER — Other Ambulatory Visit: Payer: Self-pay | Admitting: *Deleted

## 2013-08-17 ENCOUNTER — Telehealth: Payer: Self-pay | Admitting: Neurology

## 2013-08-17 MED ORDER — LAMOTRIGINE 100 MG PO TABS: 100.0000 mg | ORAL_TABLET | Freq: Two times a day (BID) | ORAL | Status: DC

## 2013-08-17 MED ORDER — AMLODIPINE BESYLATE 10 MG PO TABS: 10.0000 mg | ORAL_TABLET | Freq: Every day | ORAL | Status: DC

## 2013-08-17 NOTE — Telephone Encounter (Signed)
Tiffany [name] needs a 90 day supply due to her new Insurance

## 2013-08-17 NOTE — Telephone Encounter (Signed)
Rx has been sent for 90 day supply.

## 2013-08-17 NOTE — Telephone Encounter (Signed)
Pt has changed insurance to [employer]. Per Holland Falling they have to have prescriptions written for a 90 day supply.  Pt asked if we could send a new 90 day prescription for her Lamictal to [facility].  She only has 3 days left of these pills.  Please call if necessary.  Thank you

## 2013-09-14 ENCOUNTER — Ambulatory Visit (HOSPITAL_BASED_OUTPATIENT_CLINIC_OR_DEPARTMENT_OTHER)
Admission: RE | Admit: 2013-09-14 | Discharge: 2013-09-14 | Disposition: A | Discharge status: Home / Self Care | Payer: Managed Care, Other (non HMO) | Source: Ambulatory Visit | Attending: Internal Medicine | Admitting: Internal Medicine

## 2013-09-14 ENCOUNTER — Ambulatory Visit (INDEPENDENT_AMBULATORY_CARE_PROVIDER_SITE_OTHER): Payer: Managed Care, Other (non HMO) | Admitting: Internal Medicine

## 2013-09-14 ENCOUNTER — Encounter: Payer: Self-pay | Admitting: Internal Medicine

## 2013-09-14 VITALS — BP 114/75 | HR 98 | Temp 98.4°F | Resp 18 | Wt 158.0 lb

## 2013-09-14 DIAGNOSIS — Z87898 Personal history of other specified conditions: Secondary | ICD-10-CM | POA: Insufficient documentation

## 2013-09-14 DIAGNOSIS — R071 Chest pain on breathing: Secondary | ICD-10-CM

## 2013-09-14 DIAGNOSIS — Z803 Family history of malignant neoplasm of breast: Secondary | ICD-10-CM

## 2013-09-14 DIAGNOSIS — R0789 Other chest pain: Secondary | ICD-10-CM

## 2013-09-14 DIAGNOSIS — N644 Mastodynia: Secondary | ICD-10-CM

## 2013-09-14 DIAGNOSIS — I10 Essential (primary) hypertension: Secondary | ICD-10-CM

## 2013-09-14 LAB — CBC WITH DIFFERENTIAL/PLATELET
Basophils Absolute: 0 10*3/uL (ref 0.0–0.1)
Basophils Relative: 0 % (ref 0–1)
EOS ABS: 0.2 10*3/uL (ref 0.0–0.7)
EOS PCT: 3 % (ref 0–5)
HCT: 37.5 % (ref 36.0–46.0)
Hemoglobin: 12.6 g/dL (ref 12.0–15.0)
LYMPHS ABS: 1.6 10*3/uL (ref 0.7–4.0)
Lymphocytes Relative: 29 % (ref 12–46)
MCH: 25.8 pg — AB (ref 26.0–34.0)
MCHC: 33.6 g/dL (ref 30.0–36.0)
MCV: 76.7 fL — AB (ref 78.0–100.0)
Monocytes Absolute: 0.5 10*3/uL (ref 0.1–1.0)
Monocytes Relative: 9 % (ref 3–12)
Neutro Abs: 3.2 10*3/uL (ref 1.7–7.7)
Neutrophils Relative %: 59 % (ref 43–77)
PLATELETS: 369 10*3/uL (ref 150–400)
RBC: 4.89 MIL/uL (ref 3.87–5.11)
RDW: 13.9 % (ref 11.5–15.5)
WBC: 5.5 10*3/uL (ref 4.0–10.5)

## 2013-09-14 LAB — COMPREHENSIVE METABOLIC PANEL
ALT: 28 U/L (ref 0–35)
AST: 21 U/L (ref 0–37)
Albumin: 4.6 g/dL (ref 3.5–5.2)
Alkaline Phosphatase: 115 U/L (ref 39–117)
BILIRUBIN TOTAL: 0.5 mg/dL (ref 0.2–1.2)
BUN: 11 mg/dL (ref 6–23)
CO2: 28 meq/L (ref 19–32)
CREATININE: 0.75 mg/dL (ref 0.50–1.10)
Calcium: 10.1 mg/dL (ref 8.4–10.5)
Chloride: 102 mEq/L (ref 96–112)
Glucose, Bld: 81 mg/dL (ref 70–99)
Potassium: 5 mEq/L (ref 3.5–5.3)
Sodium: 141 mEq/L (ref 135–145)
Total Protein: 7.3 g/dL (ref 6.0–8.3)

## 2013-09-14 LAB — TSH: TSH: 1.214 u[IU]/mL (ref 0.350–4.500)

## 2013-09-14 NOTE — Progress Notes (Signed)
Subjective:    Patient ID: Tiffany [name], female    DOB: 13-Oct-1965, 48 y.o.   MRN: [id number]  HPI  Tiffany [name] is here for acute visit.   She reports she has had left breast pain for past 5 days and intermittantly over several weeks.  She denies injury or trauma.  No SOB.   Mother has breast Ca  No nipple discharge .  Pt. Does not feel mass in breast  No Known Allergies Past Medical History  Diagnosis Date  . Non Hodgkin's lymphoma 2/08    stage III  . Obesity   . Seizure disorder   . Microscopic hematuria   . Hypertension   . Acute pharyngitis 10/28/2012   Past Surgical History  Procedure Laterality Date  . Cesarean section  1998, 2002  . Portacath placement  2008  . Port-a-cath removal  2009   History   Social History  . Marital Status: Married    Spouse Name: Iona Beard    Number of Children: 2  . Years of Education: college   Occupational History  . PHLEBOTOMIST    Social History Main Topics  . Smoking status: Never Smoker   . Smokeless tobacco: Never Used  . Alcohol Use: No  . Drug Use: No  . Sexual Activity: Yes    Partners: Male   Other Topics Concern  . Not on file   Social History Narrative  . No narrative on file   Family History  Problem Relation Age of Onset  . Breast cancer Mother   . Hypertension Mother   . Hypertension Father   . Hypertension Maternal Grandmother   . Hypertension Paternal Grandmother    Patient Active Problem List   Diagnosis Date Noted  . Insomnia 04/06/2013  . Seizure disorder, primary generalized 01/27/2013  . Acute pharyngitis 10/28/2012  . Palpitations 09/08/2012  . HTN (hypertension) 06/22/2012  . Atypical chest pain 12/08/2011  . Non Hodgkin's lymphoma 11/12/2011  . Family history of breast cancer 11/12/2011  . History of seizure disorder 11/12/2011  . Hematuria 02/13/2011   Current Outpatient Prescriptions on File Prior to Visit  Medication Sig Dispense Refill  . amLODipine (NORVASC) 10 MG tablet Take  1 tablet (10 mg total) by mouth daily.  90 tablet  1  . calcium carbonate (TUMS) 500 MG chewable tablet One tablet 3 times daily as needed for reflux.      . Cholecalciferol (VITAMIN D) 2000 UNITS CAPS Take 1 capsule by mouth daily.        . eszopiclone (LUNESTA) 2 MG TABS tablet TAKE 1 TABLET BY MOUTH IMMEDIATELY BEFORE BEDTIME 2-3 TIMES PER WEEK  12 tablet  0  . lamoTRIgine (LAMICTAL) 100 MG tablet Take 1 tablet (100 mg total) by mouth 2 (two) times daily.  180 tablet  1  . Multiple Vitamins-Minerals (MULTIVITAMIN WITH MINERALS) tablet Take 1 tablet by mouth daily.       No current facility-administered medications on file prior to visit.      Review of Systems See HPI    Objective:   Physical Exam Physical Exam  Nursing note and vitals reviewed.  Constitutional: She is oriented to person, place, and time. She appears well-developed and well-nourished.  HENT:  Head: Normocephalic and atraumatic.  Cardiovascular: Normal rate and regular rhythm. Exam reveals no gallop and no friction rub.  No murmur heard.  Pulmonary/Chest: Breath sounds normal. She has no wheezes. She has no rales.  Breast exam:  Careful exam of both breasts  No nipple discharge no axillary adenopathy no discrete mass bilaterally.  She is tender along extreme lateral breast area 3 o'clock and along lateral chest wall Neurological: She is alert and oriented to person, place, and time.  Skin: Skin is warm and dry.  Psychiatric: She has a normal mood and affect. Her behavior is normal.        Assessment & Plan:  Breast pain will get ultrasound  Chest wall pain  Will get CXR  Hypothyroidism  Will check TSH today  HTN  Continue meds

## 2013-09-14 NOTE — Patient Instructions (Addendum)
See me as needed  To have cxr and diagnostic mm

## 2013-09-15 ENCOUNTER — Encounter: Payer: Self-pay | Admitting: Internal Medicine

## 2013-09-19 ENCOUNTER — Encounter: Payer: 59 | Admitting: Internal Medicine

## 2013-09-19 DIAGNOSIS — Z0000 Encounter for general adult medical examination without abnormal findings: Secondary | ICD-10-CM

## 2013-09-20 ENCOUNTER — Ambulatory Visit
Admission: RE | Admit: 2013-09-20 | Discharge: 2013-09-20 | Disposition: A | Discharge status: Home / Self Care | Payer: Managed Care, Other (non HMO) | Source: Ambulatory Visit | Attending: Internal Medicine | Admitting: Internal Medicine

## 2013-09-20 ENCOUNTER — Other Ambulatory Visit: Payer: Self-pay | Admitting: Internal Medicine

## 2013-09-20 DIAGNOSIS — N644 Mastodynia: Secondary | ICD-10-CM

## 2013-09-21 ENCOUNTER — Encounter: Payer: Self-pay | Admitting: Internal Medicine

## 2013-09-26 ENCOUNTER — Telehealth: Payer: Self-pay | Admitting: *Deleted

## 2013-09-26 NOTE — Telephone Encounter (Signed)
Notified pt of breast US results.

## 2013-09-26 NOTE — Telephone Encounter (Signed)
Message copied by Conley Rolls on Mon Sep 26, 2013  1:31 PM ------      Message from: Tiffany [name]      Created: Thu Sep 22, 2013 12:56 PM       Jolayne Haines              Let Greidys know her breast ultrasound is negative .   I think she did not realize she had   CPE appt.    Just gently ask about this and if she is in a hurry to reschedule ------

## 2013-09-29 ENCOUNTER — Encounter: Payer: Self-pay | Admitting: *Deleted

## 2013-11-23 ENCOUNTER — Telehealth: Payer: Self-pay | Admitting: Internal Medicine

## 2013-11-23 NOTE — Telephone Encounter (Signed)
Spoke with pt and she reports BP at times running [phone] systolic today at work [employer] at 128/94 by medical assistsant at [employer]  .  Heart rate 101 today.  No chest pain No SOB no chest pressure  Will see pt in am

## 2013-11-24 ENCOUNTER — Ambulatory Visit (INDEPENDENT_AMBULATORY_CARE_PROVIDER_SITE_OTHER): Payer: Managed Care, Other (non HMO) | Admitting: Internal Medicine

## 2013-11-24 VITALS — BP 118/77 | HR 85 | Resp 16 | Wt 162.0 lb

## 2013-11-24 DIAGNOSIS — I10 Essential (primary) hypertension: Secondary | ICD-10-CM

## 2013-11-24 DIAGNOSIS — R002 Palpitations: Secondary | ICD-10-CM

## 2013-11-24 NOTE — Patient Instructions (Signed)
To lab    Call if any further symptoms

## 2013-11-24 NOTE — Progress Notes (Signed)
Subjective:    Patient ID: Tiffany [name], female    DOB: 09/15/1965, 48 y.o.   MRN: [id number]  HPI Tiffany [name] is here for acute visit.    5 days ago noted palpitations while watching TV  Lasted about 25 minutes  No chest pain no SOB no dizziness .  She only drinks coffee rarely  Had two more episodes  No Known Allergies Past Medical History  Diagnosis Date  . Non Hodgkin's lymphoma 2/08    stage III  . Obesity   . Seizure disorder   . Microscopic hematuria   . Hypertension   . Acute pharyngitis 10/28/2012   Past Surgical History  Procedure Laterality Date  . Cesarean section  1998, 2002  . Portacath placement  2008  . Port-a-cath removal  2009   History   Social History  . Marital Status: Married    Spouse Name: Iona Beard    Number of Children: 2  . Years of Education: college   Occupational History  . PHLEBOTOMIST    Social History Main Topics  . Smoking status: Never Smoker   . Smokeless tobacco: Never Used  . Alcohol Use: No  . Drug Use: No  . Sexual Activity: Yes    Partners: Male   Other Topics Concern  . Not on file   Social History Narrative  . No narrative on file   Family History  Problem Relation Age of Onset  . Breast cancer Mother   . Hypertension Mother   . Hypertension Father   . Hypertension Maternal Grandmother   . Hypertension Paternal Grandmother    Patient Active Problem List   Diagnosis Date Noted  . Insomnia 04/06/2013  . Seizure disorder, primary generalized 01/27/2013  . Acute pharyngitis 10/28/2012  . Palpitations 09/08/2012  . HTN (hypertension) 06/22/2012  . Atypical chest pain 12/08/2011  . Non Hodgkin's lymphoma 11/12/2011  . Family history of breast cancer 11/12/2011  . History of seizure disorder 11/12/2011  . Hematuria 02/13/2011   Current Outpatient Prescriptions on File Prior to Visit  Medication Sig Dispense Refill  . amLODipine (NORVASC) 10 MG tablet Take 1 tablet (10 mg total) by mouth daily.  90 tablet   1  . calcium carbonate (TUMS) 500 MG chewable tablet One tablet 3 times daily as needed for reflux.      . Cholecalciferol (VITAMIN D) 2000 UNITS CAPS Take 1 capsule by mouth daily.        . eszopiclone (LUNESTA) 2 MG TABS tablet TAKE 1 TABLET BY MOUTH IMMEDIATELY BEFORE BEDTIME 2-3 TIMES PER WEEK  12 tablet  0  . lamoTRIgine (LAMICTAL) 100 MG tablet Take 1 tablet (100 mg total) by mouth 2 (two) times daily.  180 tablet  1  . Multiple Vitamins-Minerals (MULTIVITAMIN WITH MINERALS) tablet Take 1 tablet by mouth daily.       No current facility-administered medications on file prior to visit.      Review of Systems    see HPI Objective:   Physical Exam   Physical Exam  Nursing note and vitals reviewed.  Constitutional: She is oriented to person, place, and time. She appears well-developed and well-nourished.  HENT:  Head: Normocephalic and atraumatic.  Cardiovascular: Normal rate and regular rhythm. Exam reveals no gallop and no friction rub.  No murmur heard.  Pulmonary/Chest: Breath sounds normal. She has no wheezes. She has no rales.  Neurological: She is alert and oriented to person, place, and time.  Skin: Skin is warm and dry.  Psychiatric: She has a normal mood and affect. Her behavior is normal.            Assessment & Plan:  EKG   NSR  No acute changes  Normal rate.  No explanation for palpitations possibly PVC  If continues will consider holter  Check TSH today avoid caffiene  HTN  BP normal continue amlodipine

## 2014-01-30 ENCOUNTER — Ambulatory Visit: Payer: 59 | Admitting: Nurse Practitioner

## 2014-01-30 ENCOUNTER — Telehealth: Payer: Self-pay | Admitting: *Deleted

## 2014-01-30 NOTE — Telephone Encounter (Signed)
Spoke with pt   Seh is having lots of vaginal dryness.  Will try OTC Replens or Luvena    If no improvement she is to see me in office for bloodwork

## 2014-01-30 NOTE — Telephone Encounter (Signed)
Avalyn would like you to call her during her lunch hour 12:30-1:30. She has a couple of questions to ask about hormone therapy.  Her cell # is [phone]

## 2014-02-03 ENCOUNTER — Other Ambulatory Visit: Payer: Self-pay | Admitting: Neurology

## 2014-02-03 ENCOUNTER — Other Ambulatory Visit: Payer: Self-pay | Admitting: Internal Medicine

## 2014-02-03 DIAGNOSIS — I10 Essential (primary) hypertension: Secondary | ICD-10-CM

## 2014-02-03 NOTE — Telephone Encounter (Signed)
Patient has appt in Jan

## 2014-02-06 NOTE — Telephone Encounter (Signed)
Requested Medications     Medication name:  Name from pharmacy:  amLODipine (NORVASC) 10 MG tablet  AMLODIPINE BESYLATE 10 MG TAB    Sig: TAKE 1 TABLET EVERY DAY    Dispense: 90 tablet Refills: 1 Start: 02/03/2014  Class: Normal    Requested on: 08/17/2013    Originally ordered on: 06/22/2012 Last refill: 11/06/2013 Order History and Details

## 2014-02-09 ENCOUNTER — Encounter: Payer: Managed Care, Other (non HMO) | Admitting: Internal Medicine

## 2014-03-07 ENCOUNTER — Ambulatory Visit: Payer: 59 | Admitting: Nurse Practitioner

## 2014-03-15 ENCOUNTER — Encounter: Payer: Managed Care, Other (non HMO) | Admitting: Internal Medicine

## 2014-04-03 ENCOUNTER — Ambulatory Visit (INDEPENDENT_AMBULATORY_CARE_PROVIDER_SITE_OTHER): Payer: Managed Care, Other (non HMO) | Admitting: Internal Medicine

## 2014-04-03 ENCOUNTER — Other Ambulatory Visit: Payer: Self-pay | Admitting: Internal Medicine

## 2014-04-03 ENCOUNTER — Encounter: Payer: Self-pay | Admitting: *Deleted

## 2014-04-03 ENCOUNTER — Encounter: Payer: Self-pay | Admitting: Internal Medicine

## 2014-04-03 VITALS — BP 127/73 | HR 96 | Temp 98.1°F | Resp 16 | Ht 59.0 in | Wt 162.0 lb

## 2014-04-03 DIAGNOSIS — I10 Essential (primary) hypertension: Secondary | ICD-10-CM

## 2014-04-03 DIAGNOSIS — C8590 Non-Hodgkin lymphoma, unspecified, unspecified site: Secondary | ICD-10-CM

## 2014-04-03 DIAGNOSIS — R319 Hematuria, unspecified: Secondary | ICD-10-CM

## 2014-04-03 DIAGNOSIS — Z0189 Encounter for other specified special examinations: Secondary | ICD-10-CM

## 2014-04-03 DIAGNOSIS — Z1231 Encounter for screening mammogram for malignant neoplasm of breast: Secondary | ICD-10-CM

## 2014-04-03 DIAGNOSIS — Z23 Encounter for immunization: Secondary | ICD-10-CM

## 2014-04-03 DIAGNOSIS — Z0000 Encounter for general adult medical examination without abnormal findings: Secondary | ICD-10-CM

## 2014-04-03 LAB — POCT URINALYSIS DIPSTICK
Bilirubin, UA: NEGATIVE
GLUCOSE UA: NEGATIVE
Ketones, UA: NEGATIVE
Leukocytes, UA: NEGATIVE
NITRITE UA: NEGATIVE
Protein, UA: NEGATIVE
Spec Grav, UA: 1.015
UROBILINOGEN UA: NEGATIVE
pH, UA: 6.5

## 2014-04-03 LAB — TSH: TSH: 1.02 u[IU]/mL (ref 0.350–4.500)

## 2014-04-03 LAB — T4, FREE: Free T4: 1.1 ng/dL (ref 0.80–1.80)

## 2014-04-03 LAB — T3, FREE: T3 FREE: 3.2 pg/mL (ref 2.3–4.2)

## 2014-04-03 NOTE — Patient Instructions (Signed)
See me as needed 

## 2014-04-03 NOTE — Progress Notes (Signed)
Subjective:    Patient ID: Tiffany [name], female    DOB: 1966/01/27, 48 y.o.   MRN: [id number]  HPI  Tiffany [name] is here for CPE  HM:  She is due for mm,  Pap done 2013,  She is a non-smoker due for flu vaccine  Problem list and meds reveiwed  No Known Allergies Past Medical History  Diagnosis Date  . Non Hodgkin's lymphoma 2/08    stage III  . Obesity   . Seizure disorder   . Microscopic hematuria   . Hypertension   . Acute pharyngitis 10/28/2012   Past Surgical History  Procedure Laterality Date  . Cesarean section  1998, 2002  . Portacath placement  2008  . Port-a-cath removal  2009   History   Social History  . Marital Status: Married    Spouse Name: Iona Beard    Number of Children: 2  . Years of Education: college   Occupational History  . PHLEBOTOMIST    Social History Main Topics  . Smoking status: Never Smoker   . Smokeless tobacco: Never Used  . Alcohol Use: No  . Drug Use: No  . Sexual Activity: Yes    Partners: Male   Other Topics Concern  . Not on file   Social History Narrative  . No narrative on file   Family History  Problem Relation Age of Onset  . Breast cancer Mother   . Hypertension Mother   . Hypertension Father   . Hypertension Maternal Grandmother   . Hypertension Paternal Grandmother    Patient Active Problem List   Diagnosis Date Noted  . Insomnia 04/06/2013  . Seizure disorder, primary generalized 01/27/2013  . Acute pharyngitis 10/28/2012  . Palpitations 09/08/2012  . HTN (hypertension) 06/22/2012  . Atypical chest pain 12/08/2011  . Non Hodgkin's lymphoma 11/12/2011  . Family history of breast cancer 11/12/2011  . History of seizure disorder 11/12/2011  . Hematuria 02/13/2011   Current Outpatient Prescriptions on File Prior to Visit  Medication Sig Dispense Refill  . amLODipine (NORVASC) 10 MG tablet TAKE 1 TABLET EVERY DAY  90 tablet  1  . lamoTRIgine (LAMICTAL) 100 MG tablet TAKE 1 TABLET TWICE DAILY  180  tablet  1  . Multiple Vitamins-Minerals (MULTIVITAMIN WITH MINERALS) tablet Take 1 tablet by mouth daily.       No current facility-administered medications on file prior to visit.      Review of Systems See HPI    Objective:   Physical Exam Physical Exam  Nursing note and vitals reviewed.  Constitutional: She is oriented to person, place, and time. She appears well-developed and well-nourished.  HENT:  Head: Normocephalic and atraumatic.  Right Ear: Tympanic membrane and ear canal normal. No drainage. Tympanic membrane is not injected and not erythematous.  Left Ear: Tympanic membrane and ear canal normal. No drainage. Tympanic membrane is not injected and not erythematous.  Nose: Nose normal. Right sinus exhibits no maxillary sinus tenderness and no frontal sinus tenderness. Left sinus exhibits no maxillary sinus tenderness and no frontal sinus tenderness.  Mouth/Throat: Oropharynx is clear and moist. No oral lesions. No oropharyngeal exudate.  Eyes: Conjunctivae and EOM are normal. Pupils are equal, round, and reactive to light.  Neck: Normal range of motion. Neck supple. No JVD present. Carotid bruit is not present. No mass and no thyromegaly present.  Cardiovascular: Normal rate, regular rhythm, S1 normal, S2 normal and intact distal pulses. Exam reveals no gallop and no friction rub.  No murmur  heard.  Pulses:  Carotid pulses are 2+ on the right side, and 2+ on the left side.  Dorsalis pedis pulses are 2+ on the right side, and 2+ on the left side.  No carotid bruit. No LE edema  Pulmonary/Chest: Breath sounds normal. She has no wheezes. She has no rales. She exhibits no tenderness. Breast no discrete mass no nipple discharge no axillary adenopathy bilaterally  Abdominal: Soft. Bowel sounds are normal. She exhibits no distension and no mass. There is no hepatosplenomegaly. There is no tenderness. There is no CVA tenderness.  Musculoskeletal: Normal range of motion.  No active  synovitis to joints.  Lymphadenopathy:  She has no cervical adenopathy.  She has no axillary adenopathy.  Right: No inguinal and no supraclavicular adenopathy present.  Left: No inguinal and no supraclavicular adenopathy present.  Neurological: She is alert and oriented to person, place, and time. She has normal strength and normal reflexes. She displays no tremor. No cranial nerve deficit or sensory deficit. Coordination and gait normal.  Skin: Skin is warm and dry. No rash noted. No cyanosis. Nails show no clubbing.  Psychiatric: She has a normal mood and affect. Her speech is normal and behavior is normal. Cognition and memory are normal.          Assessment & Plan:  HM:  Flu vaccine today , will order 3D mm,  Pap next year.  Non-smoker  HTN  Continue meds  Chronic hematuria   Had complete work up with Dr. Wendy Poet.   Cysto unrevealing,  CT scan nl   NHL   Managed in [place]   Seizure D/o  Continue meds   No seizures in quite some time

## 2014-04-04 LAB — VITAMIN D 25 HYDROXY (VIT D DEFICIENCY, FRACTURES): VIT D 25 HYDROXY: 28 ng/mL — AB (ref 30–89)

## 2014-04-05 ENCOUNTER — Encounter: Payer: Self-pay | Admitting: Internal Medicine

## 2014-04-06 ENCOUNTER — Telehealth: Payer: Self-pay | Admitting: Nurse Practitioner

## 2014-04-06 NOTE — Telephone Encounter (Signed)
Left message for patient regarding rescheduling 06/14/14 appointment per Carolyn's schedule.

## 2014-04-07 NOTE — Progress Notes (Signed)
Pt is aware of her lab results-eh

## 2014-04-10 ENCOUNTER — Encounter: Payer: Self-pay | Admitting: Internal Medicine

## 2014-05-03 ENCOUNTER — Ambulatory Visit: Admission: RE | Admit: 2014-05-03 | Payer: Managed Care, Other (non HMO) | Source: Ambulatory Visit

## 2014-05-03 ENCOUNTER — Ambulatory Visit
Admission: RE | Admit: 2014-05-03 | Discharge: 2014-05-03 | Disposition: A | Discharge status: Home / Self Care | Payer: Managed Care, Other (non HMO) | Source: Ambulatory Visit | Attending: Internal Medicine | Admitting: Internal Medicine

## 2014-05-03 ENCOUNTER — Other Ambulatory Visit: Payer: Self-pay | Admitting: Internal Medicine

## 2014-05-03 DIAGNOSIS — Z1231 Encounter for screening mammogram for malignant neoplasm of breast: Secondary | ICD-10-CM

## 2014-05-07 ENCOUNTER — Encounter: Payer: Self-pay | Admitting: Internal Medicine

## 2014-06-14 ENCOUNTER — Ambulatory Visit: Payer: 59 | Admitting: Nurse Practitioner

## 2014-07-10 ENCOUNTER — Ambulatory Visit: Payer: 59 | Admitting: Nurse Practitioner

## 2014-07-18 ENCOUNTER — Emergency Department (HOSPITAL_COMMUNITY)
Admission: EM | Admit: 2014-07-18 | Discharge: 2014-07-18 | Disposition: A | Discharge status: Home / Self Care | Payer: Managed Care, Other (non HMO) | Attending: Emergency Medicine | Admitting: Emergency Medicine

## 2014-07-18 ENCOUNTER — Encounter (HOSPITAL_COMMUNITY): Payer: Self-pay | Admitting: *Deleted

## 2014-07-18 ENCOUNTER — Emergency Department (HOSPITAL_COMMUNITY): Payer: Managed Care, Other (non HMO)

## 2014-07-18 DIAGNOSIS — R079 Chest pain, unspecified: Secondary | ICD-10-CM | POA: Diagnosis present

## 2014-07-18 DIAGNOSIS — Z79899 Other long term (current) drug therapy: Secondary | ICD-10-CM | POA: Diagnosis not present

## 2014-07-18 DIAGNOSIS — Z8709 Personal history of other diseases of the respiratory system: Secondary | ICD-10-CM | POA: Diagnosis not present

## 2014-07-18 DIAGNOSIS — Z8572 Personal history of non-Hodgkin lymphomas: Secondary | ICD-10-CM | POA: Diagnosis not present

## 2014-07-18 DIAGNOSIS — I10 Essential (primary) hypertension: Secondary | ICD-10-CM | POA: Diagnosis not present

## 2014-07-18 DIAGNOSIS — E669 Obesity, unspecified: Secondary | ICD-10-CM | POA: Insufficient documentation

## 2014-07-18 DIAGNOSIS — R0789 Other chest pain: Secondary | ICD-10-CM | POA: Diagnosis not present

## 2014-07-18 DIAGNOSIS — G40909 Epilepsy, unspecified, not intractable, without status epilepticus: Secondary | ICD-10-CM | POA: Diagnosis not present

## 2014-07-18 LAB — HEPATIC FUNCTION PANEL
ALK PHOS: 114 U/L (ref 39–117)
ALT: 28 U/L (ref 0–35)
AST: 21 U/L (ref 0–37)
Albumin: 4.8 g/dL (ref 3.5–5.2)
Bilirubin, Direct: <0.1 mg/dL (ref 0.0–0.5)
Total Bilirubin: 0.5 mg/dL (ref 0.3–1.2)
Total Protein: 8.2 g/dL (ref 6.0–8.3)

## 2014-07-18 LAB — CBC
HCT: 38.6 % (ref 36.0–46.0)
Hemoglobin: 12.2 g/dL (ref 12.0–15.0)
MCH: 25.6 pg — ABNORMAL LOW (ref 26.0–34.0)
MCHC: 31.6 g/dL (ref 30.0–36.0)
MCV: 81.1 fL (ref 78.0–100.0)
PLATELETS: 373 10*3/uL (ref 150–400)
RBC: 4.76 MIL/uL (ref 3.87–5.11)
RDW: 13.7 % (ref 11.5–15.5)
WBC: 8.6 10*3/uL (ref 4.0–10.5)

## 2014-07-18 LAB — BASIC METABOLIC PANEL
Anion gap: 10 (ref 5–15)
BUN: 11 mg/dL (ref 6–23)
CO2: 29 mmol/L (ref 19–32)
Calcium: 9.9 mg/dL (ref 8.4–10.5)
Chloride: 103 mmol/L (ref 96–112)
Creatinine, Ser: 0.64 mg/dL (ref 0.50–1.10)
GFR calc Af Amer: >90 mL/min (ref >90)
GLUCOSE: 88 mg/dL (ref 70–99)
Potassium: 3.7 mmol/L (ref 3.5–5.1)
SODIUM: 142 mmol/L (ref 135–145)

## 2014-07-18 LAB — D-DIMER, QUANTITATIVE: D-Dimer, Quant: <0.27 ug/mL-FEU (ref 0.00–0.48)

## 2014-07-18 LAB — TROPONIN I

## 2014-07-18 LAB — LIPASE, BLOOD: Lipase: 20 U/L (ref 11–59)

## 2014-07-18 MED ORDER — OMEPRAZOLE 20 MG PO CPDR: 20.0000 mg | DELAYED_RELEASE_CAPSULE | Freq: Every day | ORAL | Status: DC

## 2014-07-18 MED ORDER — KETOROLAC TROMETHAMINE 30 MG/ML IJ SOLN
30.0000 mg | Freq: Once | INTRAMUSCULAR | Status: DC
  Filled 2014-07-18: qty 1

## 2014-07-18 MED ORDER — KETOROLAC TROMETHAMINE 60 MG/2ML IM SOLN
60.0000 mg | Freq: Once | INTRAMUSCULAR | Status: AC
  Administered 2014-07-18: 60 mg via INTRAMUSCULAR
  Filled 2014-07-18: qty 2

## 2014-07-18 MED ORDER — ASPIRIN 81 MG PO CHEW
324.0000 mg | CHEWABLE_TABLET | Freq: Once | ORAL | Status: AC
  Administered 2014-07-18: 324 mg via ORAL
  Filled 2014-07-18: qty 4

## 2014-07-18 MED ORDER — GI COCKTAIL ~~LOC~~
30.0000 mL | Freq: Once | ORAL | Status: AC
  Administered 2014-07-18: 30 mL via ORAL
  Filled 2014-07-18: qty 30

## 2014-07-18 NOTE — ED Provider Notes (Signed)
CSN: [id number]     Arrival date & time 07/18/14  1820 History   First MD Initiated Contact with Patient 07/18/14 2111     Chief Complaint  Patient presents with  . Chest Pain     (Consider location/radiation/quality/duration/timing/severity/associated sxs/prior Treatment) HPI  49 year old female with 8 days of chest pain/chest pressure. Patient's pain has been constant, 24 hours per day but seem to worsen this morning around 10 AM. The pain is the same type of pain was worse. Nothing makes the pain worse, including not exertion or deep inspiration. Patient took antacids and over-the-counter medicines for GI relief with some decrease in her pain but her pain has remained. Pain is currently a 5/10. No shortness of breath, cough, or fevers. No radiation of the pain. The pain is just under her sternum. No nausea or vomiting. No back symptoms. Patient has a history of hypertension but denies diabetes or hyperlipidemia. Never had pain like this before.  Past Medical History  Diagnosis Date  . Non Hodgkin's lymphoma 2/08    stage III  . Obesity   . Seizure disorder   . Microscopic hematuria   . Hypertension   . Acute pharyngitis 10/28/2012   Past Surgical History  Procedure Laterality Date  . Cesarean section  1998, 2002  . Portacath placement  2008  . Port-a-cath removal  2009   Family History  Problem Relation Age of Onset  . Breast cancer Mother   . Hypertension Mother   . Hypertension Father   . Hypertension Maternal Grandmother   . Hypertension Paternal Grandmother    History  Substance Use Topics  . Smoking status: Never Smoker   . Smokeless tobacco: Never Used  . Alcohol Use: No   OB History    Gravida Para Term Preterm AB TAB SAB Ectopic Multiple Living   2 2             Review of Systems  Constitutional: Negative for fever.  Respiratory: Negative for cough and shortness of breath.   Cardiovascular: Positive for chest pain. Negative for leg swelling.   Gastrointestinal: Negative for nausea, vomiting and abdominal pain.  Musculoskeletal: Negative for back pain.  All other systems reviewed and are negative.     Allergies  Review of patient's allergies indicates no known allergies.  Home Medications   Prior to Admission medications   Medication Sig Start Date End Date Taking? Authorizing Provider  amLODipine (NORVASC) 10 MG tablet TAKE 1 TABLET EVERY DAY 02/06/14   Lanice Shirts, MD  lamoTRIgine (LAMICTAL) 100 MG tablet TAKE 1 TABLET TWICE DAILY 02/03/14   Antony Contras, MD  Multiple Vitamins-Minerals (MULTIVITAMIN WITH MINERALS) tablet Take 1 tablet by mouth daily.    Historical Provider, MD   BP 142/82 mmHg  Pulse 91  Temp(Src) 98.5 F (36.9 C) (Oral)  Resp 16  SpO2 100% Physical Exam  Constitutional: She is oriented to person, place, and time. She appears well-developed and well-nourished.  HENT:  Head: Normocephalic and atraumatic.  Right Ear: External ear normal.  Left Ear: External ear normal.  Nose: Nose normal.  Eyes: Right eye exhibits no discharge. Left eye exhibits no discharge.  Cardiovascular: Normal rate, regular rhythm and normal heart sounds.   Pulmonary/Chest: Effort normal and breath sounds normal. She has no wheezes. She has no rales. She exhibits tenderness (lower mid-sternum).  Abdominal: Soft. She exhibits no distension. There is no tenderness.  Neurological: She is alert and oriented to person, place, and time.  Skin: Skin  is warm and dry. She is not diaphoretic.  Nursing note and vitals reviewed.   ED Course  Procedures (including critical care time) Labs Review Labs Reviewed  CBC - Abnormal; Notable for the following:    MCH 25.6 (*)    All other components within normal limits  BASIC METABOLIC PANEL  TROPONIN I  D-DIMER, QUANTITATIVE  LIPASE, BLOOD  HEPATIC FUNCTION PANEL    Imaging Review Dg Chest 2 View  07/18/2014   CLINICAL DATA:  Intermittent chest pain for 1 week. Pain  worsening and not subsiding today.  EXAM: CHEST  2 VIEW  COMPARISON:  09/14/2013  FINDINGS: The heart size and mediastinal contours are within normal limits. Both lungs are clear. The visualized skeletal structures are unremarkable.  IMPRESSION: No active cardiopulmonary disease.   Electronically Signed   By: Lucienne Capers M.D.   On: 07/18/2014 19:18     EKG Interpretation   Date/Time:  Tuesday July 18 2014 18:27:01 EST Ventricular Rate:  95 PR Interval:  150 QRS Duration: 75 QT Interval:  340 QTC Calculation: 427 R Axis:   64 Text Interpretation:  Normal sinus rhythm Normal ECG tachycardia has  resolved since 2014 Confirmed by Talita Recht  MD, Jeff Davis ([id number]) on 07/18/2014  6:32:35 PM      MDM   Final diagnoses:  Chest pain, unspecified chest pain type    Patient with atypical chest pain for over 1 week. Patient's chest pain has been worse over the last 12+ hours, has a normal EKG, chest x-ray, and negative troponin. Given the low suspicion for ACS I do not feel she needs a repeat troponin given how long her symptoms have been on. Symptoms moderately improved with GI cocktail and Toradol. We'll recommend Prilosec, antacids, and follow-up with PCP. Patient is low risk for pulmonary embolism and with a negative d-dimer I feel she is adequately ruled out.    Ephraim Hamburger, MD 07/18/14 [phone]

## 2014-07-18 NOTE — Discharge Instructions (Signed)

## 2014-07-18 NOTE — ED Notes (Signed)
Pt reports central chest pressure/discomfort x1 week, pt thought it was gas. Pain increased today. Pain 4/10. Denies SOB, denies n/v/d.

## 2014-07-18 NOTE — ED Notes (Signed)
Pt statble at time of dc, questions and cocerns for the nurse, denied. Pt request to speak with M.D.

## 2014-07-27 ENCOUNTER — Other Ambulatory Visit: Payer: Self-pay | Admitting: Neurology

## 2014-07-27 ENCOUNTER — Other Ambulatory Visit: Payer: Self-pay | Admitting: Internal Medicine

## 2014-07-27 NOTE — Telephone Encounter (Signed)
Patient has appt next month

## 2014-07-27 NOTE — Telephone Encounter (Signed)
Refill request

## 2014-08-08 ENCOUNTER — Ambulatory Visit (INDEPENDENT_AMBULATORY_CARE_PROVIDER_SITE_OTHER): Payer: Managed Care, Other (non HMO) | Admitting: Nurse Practitioner

## 2014-08-08 ENCOUNTER — Encounter: Payer: Self-pay | Admitting: Nurse Practitioner

## 2014-08-08 VITALS — BP 117/78 | HR 86 | Ht 59.0 in | Wt 166.4 lb

## 2014-08-08 DIAGNOSIS — G40309 Generalized idiopathic epilepsy and epileptic syndromes, not intractable, without status epilepticus: Secondary | ICD-10-CM | POA: Diagnosis not present

## 2014-08-08 MED ORDER — LAMOTRIGINE 100 MG PO TABS: 100.0000 mg | ORAL_TABLET | Freq: Two times a day (BID) | ORAL | Status: DC

## 2014-08-08 NOTE — Progress Notes (Signed)
[facility]  PATIENT: Tiffany [name] DOB: 1965/11/15   REASON FOR VISIT: Follow-up for seizure disorder HISTORY FROM: Patient  HISTORY OF PRESENT ILLNESS:[name] , 49 year old returns for follow up after her last visit 01/27/13.She has not had any seizures in 3 years. She has tapered and discontinued Dilantin. She is tolerating Lamictal without difficulty. The MRI brain w/wo on 05/10/09 at [facility] which was normal. EEG 03/26/09 performed in awake and drowsy states was normal as well. She has no new complaints today.   REVIEW OF SYSTEMS: Full 14 system review of systems performed and notable only for those listed, all others are neg:  Constitutional: neg  Cardiovascular: neg Ear/Nose/Throat: neg  Skin: neg Eyes: neg Respiratory: neg Gastroitestinal: neg  Hematology/Lymphatic: neg  Endocrine: neg Musculoskeletal:neg Allergy/Immunology: neg Neurological: neg Psychiatric: neg Sleep : neg   ALLERGIES: No Known Allergies  HOME MEDICATIONS: Outpatient Prescriptions Prior to Visit  Medication Sig Dispense Refill  . amLODipine (NORVASC) 10 MG tablet TAKE 1 TABLET BY MOUTH EVERY DAY 90 tablet 1  . calcium carbonate (TUMS EX) 750 MG chewable tablet Chew 3 tablets by mouth daily as needed for heartburn (indigestion).    [name] lamoTRIgine (LAMICTAL) 100 MG tablet TAKE 1 TABLET BY MOUTH TWICE A DAY 60 tablet 0  . Multiple Vitamins-Minerals (MULTIVITAMIN WITH MINERALS) tablet Take 1 tablet by mouth daily.    . Simethicone (GAS RELIEF PO) Take 2 capsules by mouth daily as needed (gas relief).    [name] omeprazole (PRILOSEC) 20 MG capsule Take 1 capsule (20 mg total) by mouth daily. (Patient not taking: Reported on 08/08/2014) 30 capsule 0   No facility-administered medications prior to visit.    PAST MEDICAL HISTORY: Past Medical History  Diagnosis Date  . Non Hodgkin's lymphoma 2/08    stage III  . Obesity   . Seizure disorder   .  Microscopic hematuria   . Hypertension   . Acute pharyngitis 10/28/2012    PAST SURGICAL HISTORY: Past Surgical History  Procedure Laterality Date  . Cesarean section  1998, 2002  . Portacath placement  2008  . Port-a-cath removal  2009    FAMILY HISTORY: Family History  Problem Relation Age of Onset  . Breast cancer Mother   . Hypertension Mother   . Hypertension Father   . Hypertension Maternal Grandmother   . Hypertension Paternal Grandmother     SOCIAL HISTORY: History   Social History  . Marital Status: Married    Spouse Name: Tiffany [name]  . Number of Children: 2  . Years of Education: college   Occupational History  . PHLEBOTOMIST    Social History Main Topics  . Smoking status: Never Smoker   . Smokeless tobacco: Never Used  . Alcohol Use: No  . Drug Use: No  . Sexual Activity:    Partners: Male   Other Topics Concern  . Not on file   Social History Narrative     PHYSICAL EXAM  Filed Vitals:   08/08/14 0854  BP: 117/78  Pulse: 86  Height: 4\' 11"  (1.499 m)  Weight: 166 lb 6.4 oz (75.479 kg)   Body mass index is 33.59 kg/(m^2).  Generalized: Well developed, in no acute distress  Head: normocephalic and atraumatic,. Oropharynx benign  Musculoskeletal: No deformity   Neurological examination   Mentation: Alert oriented to time, place, history taking. Attention span and concentration appropriate. Recent and remote memory intact.  Follows all commands speech and language fluent.  Cranial nerve II-XII: Fundoscopic exam not done Pupils were equal round reactive to light extraocular movements were full, visual field were full on confrontational test. Facial sensation and strength were normal. hearing was intact to finger rubbing bilaterally. Uvula tongue midline. head turning and shoulder shrug were normal and symmetric.Tongue protrusion into cheek strength was normal. Motor: normal bulk and tone, full strength in the BUE, BLE, fine finger movements  normal, no pronator drift. No focal weakness Sensory: normal and symmetric to light touch, pinprick, and  Vibration, proprioception  Coordination: finger-nose-finger, heel-to-shin bilaterally, no dysmetria Reflexes: Brachioradialis 2/2, biceps 2/2, triceps 2/2, patellar 2/2, Achilles 2/2, plantar responses were flexor bilaterally. Gait and Station: Rising up from seated position without assistance, normal stance,  moderate stride, good arm swing, smooth turning, able to perform tiptoe, and heel walking without difficulty. Tandem gait is steady  DIAGNOSTIC DATA (LABS, IMAGING, TESTING) - I reviewed patient records, labs, notes, testing and imaging myself where available.  Lab Results  Component Value Date   WBC 8.6 07/18/2014   HGB 12.2 07/18/2014   HCT 38.6 07/18/2014   MCV 81.1 07/18/2014   PLT 373 07/18/2014      Component Value Date/Time   NA 142 07/18/2014 1853   K 3.7 07/18/2014 1853   CL 103 07/18/2014 1853   CO2 29 07/18/2014 1853   GLUCOSE 88 07/18/2014 1853   BUN 11 07/18/2014 1853   CREATININE 0.64 07/18/2014 1853   CREATININE 0.75 09/14/2013 1023   CALCIUM 9.9 07/18/2014 1853   PROT 8.2 07/18/2014 2118   ALBUMIN 4.8 07/18/2014 2118   AST 21 07/18/2014 2118   ALT 28 07/18/2014 2118   ALKPHOS 114 07/18/2014 2118   BILITOT 0.5 07/18/2014 2118   GFRNONAA >90 07/18/2014 1853   GFRAA >90 07/18/2014 1853       ASSESSMENT AND PLAN  49 y.o. year old female  has a past medical history of seizure disorder with last seizure 3-4 years ago. She is currently on Lamictal 100 twice daily without side effects to the drug. Reviewed recent CBC and CMP from 07/18/2014 within normal  limits  Continue Lamictal at current dose will refill for 3 months with 3 refills Follow-up yearly and when necessary Call for any seizure activity Dennie Bible, [facility], [facility], [facility] [address] [place], [place] [address] [phone]

## 2014-08-08 NOTE — Patient Instructions (Signed)
Continue Lamictal at current dose will refill for 3 months with 3 refills Follow-up yearly and when necessary Call for any seizure activity

## 2014-08-09 NOTE — Progress Notes (Signed)
I agree with the above plan 

## 2014-08-22 ENCOUNTER — Other Ambulatory Visit: Payer: Self-pay | Admitting: *Deleted

## 2014-08-22 NOTE — Telephone Encounter (Signed)
Refill request

## 2014-08-23 ENCOUNTER — Other Ambulatory Visit: Payer: Self-pay | Admitting: Neurology

## 2014-08-23 MED ORDER — AMLODIPINE BESYLATE 10 MG PO TABS: 10.0000 mg | ORAL_TABLET | Freq: Every day | ORAL | Status: DC

## 2014-10-24 ENCOUNTER — Encounter: Payer: Self-pay | Admitting: *Deleted

## 2014-10-24 ENCOUNTER — Telehealth: Payer: Self-pay | Admitting: *Deleted

## 2014-10-24 NOTE — Telephone Encounter (Signed)
Unable to reach patient at time of Pre-Visit Call.  Left message for patient to return call when available.    

## 2014-10-24 NOTE — Addendum Note (Signed)
Addended by: Leticia Penna A on: 10/24/2014 10:15 AM   Modules accepted: Medications

## 2014-10-25 ENCOUNTER — Telehealth: Payer: Self-pay | Admitting: Family

## 2014-10-25 ENCOUNTER — Encounter: Payer: Self-pay | Admitting: Family

## 2014-10-25 ENCOUNTER — Other Ambulatory Visit: Payer: Self-pay | Admitting: Family

## 2014-10-25 ENCOUNTER — Ambulatory Visit (INDEPENDENT_AMBULATORY_CARE_PROVIDER_SITE_OTHER): Payer: Managed Care, Other (non HMO) | Admitting: Family

## 2014-10-25 VITALS — BP 110/78 | HR 53 | Temp 98.5°F | Resp 16 | Ht 59.0 in | Wt 163.4 lb

## 2014-10-25 DIAGNOSIS — I10 Essential (primary) hypertension: Secondary | ICD-10-CM

## 2014-10-25 DIAGNOSIS — G40309 Generalized idiopathic epilepsy and epileptic syndromes, not intractable, without status epilepticus: Secondary | ICD-10-CM

## 2014-10-25 DIAGNOSIS — K219 Gastro-esophageal reflux disease without esophagitis: Secondary | ICD-10-CM

## 2014-10-25 DIAGNOSIS — C8590 Non-Hodgkin lymphoma, unspecified, unspecified site: Secondary | ICD-10-CM

## 2014-10-25 DIAGNOSIS — K5900 Constipation, unspecified: Secondary | ICD-10-CM | POA: Insufficient documentation

## 2014-10-25 DIAGNOSIS — Z5181 Encounter for therapeutic drug level monitoring: Secondary | ICD-10-CM | POA: Diagnosis not present

## 2014-10-25 DIAGNOSIS — M5431 Sciatica, right side: Secondary | ICD-10-CM | POA: Insufficient documentation

## 2014-10-25 LAB — BASIC METABOLIC PANEL
BUN: 9 mg/dL (ref 6–23)
CALCIUM: 10.1 mg/dL (ref 8.4–10.5)
CO2: 28 mEq/L (ref 19–32)
Chloride: 102 mEq/L (ref 96–112)
Creat: 0.84 mg/dL (ref 0.50–1.10)
GLUCOSE: 99 mg/dL (ref 70–99)
Potassium: 5 mEq/L (ref 3.5–5.3)
SODIUM: 140 meq/L (ref 135–145)

## 2014-10-25 LAB — HEPATIC FUNCTION PANEL
ALT: 21 U/L (ref 0–35)
AST: 17 U/L (ref 0–37)
Albumin: 4.7 g/dL (ref 3.5–5.2)
Alkaline Phosphatase: 125 U/L — ABNORMAL HIGH (ref 39–117)
BILIRUBIN INDIRECT: 0.3 mg/dL (ref 0.2–1.2)
BILIRUBIN TOTAL: 0.4 mg/dL (ref 0.2–1.2)
Bilirubin, Direct: 0.1 mg/dL (ref 0.0–0.3)
Total Protein: 7.8 g/dL (ref 6.0–8.3)

## 2014-10-25 MED ORDER — MELOXICAM 7.5 MG PO TABS: 7.5000 mg | ORAL_TABLET | Freq: Every day | ORAL | Status: DC

## 2014-10-25 MED ORDER — RANITIDINE HCL 75 MG PO TABS: 75.0000 mg | ORAL_TABLET | Freq: Two times a day (BID) | ORAL | Status: DC

## 2014-10-25 NOTE — Assessment & Plan Note (Signed)
D/c ibuprofen, short course of meloxicam.

## 2014-10-25 NOTE — Progress Notes (Signed)
Pre visit review using our clinic review tool, if applicable. No additional management support is needed unless otherwise documented below in the visit note. 

## 2014-10-25 NOTE — Telephone Encounter (Signed)
Spoke with pt. She states pharmacy did not receive Rx for miralax.  Advised pt that miralax is OTC.

## 2014-10-25 NOTE — Assessment & Plan Note (Signed)
Add 1 cap of miralax to 8 ounce of juice today. If no bm repeat tomorrow.  Then as needed.  Continue to push fresh fruits/veggies and water. Call if your constipation does not improve.

## 2014-10-25 NOTE — Assessment & Plan Note (Signed)
Uncontrolled.  Plan rx with low dose zantac, discussed GERD diet.

## 2014-10-25 NOTE — Telephone Encounter (Signed)
Caller name: damiah Relation to pt: self Call back number: [phone] Pharmacy: [facility] [address]  Reason for call:   Patient states that meloxicam is not working and is wanting to know is she can try something else. Took medicine this morning at 10am and is still hurting.

## 2014-10-25 NOTE — Progress Notes (Signed)
Subjective:    Patient ID: Tiffany [name], female    DOB: Oct 15, 1965, 49 y.o.   MRN: [id number]  HPI  Tiffany [name] is a 49 yr old female who presents today to establish care.    Pmhx is significant for the following:  1) HTN- Patient is currently maintained on the following medications for blood pressure: amlodipine 10 mg Patient reports good compliance with blood pressure medications. Patient denies chest pain, shortness of breath or swelling. Last 3 blood pressure readings in our office are as follows:   BP Readings from Last 3 Encounters:  10/25/14 110/78  08/08/14 117/78  07/18/14 131/70   2) Non-Hodgkin's lymphoma- reports that she was diagnosed in 2008.  Reports that she is >5 years in remission and oncology has signed off.   3) Seizure Disorder- reports that she has not had a seizure in 2 years. She is followed by Dr. Leonie Man. maintained on lamictal.   3) Constipation- 5/8-5/10 no BM, 5/11 and 5/12 small stool. 5/13 took 3 colace (small bm) 5/15 took 3 colace, had small bm on 5/16 and 5/17  4) GERD- reports that she completed 14 days of omeprazole.  Symptoms initially improved but then returned.   5) Pinched nerve- right buttock radiates down the right leg.  Improved with 800 mg of ibupofren.    Review of Systems Notes occasional dry cough  Past Medical History  Diagnosis Date  . Non Hodgkin's lymphoma 2/08    stage III  . Obesity   . Seizure disorder   . Microscopic hematuria   . Hypertension   . Acute pharyngitis 10/28/2012    History   Social History  . Marital Status: Married    Spouse Name: Iona Beard  . Number of Children: 2  . Years of Education: college   Occupational History  . PHLEBOTOMIST    Social History Main Topics  . Smoking status: Never Smoker   . Smokeless tobacco: Never Used  . Alcohol Use: No  . Drug Use: No  . Sexual Activity:    Partners: Male   Other Topics Concern  . Not on file   Social History Narrative     Past Surgical History  Procedure Laterality Date  . Cesarean section  1998, 2002  . Portacath placement  2008  . Port-a-cath removal  2009    Family History  Problem Relation Age of Onset  . Breast cancer Mother   . Hypertension Mother   . Hypertension Father   . Hypertension Maternal Grandmother   . Hypertension Paternal Grandmother     No Known Allergies  Current Outpatient Prescriptions on File Prior to Visit  Medication Sig Dispense Refill  . amLODipine (NORVASC) 10 MG tablet Take 1 tablet (10 mg total) by mouth daily. 90 tablet 1  . calcium carbonate (TUMS EX) 750 MG chewable tablet Chew 3 tablets by mouth daily as needed for heartburn (indigestion).    [name] lamoTRIgine (LAMICTAL) 100 MG tablet Take 1 tablet (100 mg total) by mouth 2 (two) times daily. 180 tablet 3  . Multiple Vitamins-Minerals (MULTIVITAMIN WITH MINERALS) tablet Take 1 tablet by mouth daily.    . Simethicone (GAS RELIEF PO) Take 2 capsules by mouth daily as needed (gas relief).     No current facility-administered medications on file prior to visit.    BP 110/78 mmHg  Pulse 53  Temp(Src) 98.5 F (36.9 C) (Oral)  Resp 16  Ht 4\' 11"  (1.499 m)  Wt 163 lb 6.4 oz (74.118 kg)  BMI 32.99 kg/m2  SpO2 99%       Objective:   Physical Exam  Constitutional: She is oriented to person, place, and time. She appears well-developed and well-nourished.  HENT:  Head: Normocephalic and atraumatic.  Right Ear: Tympanic membrane and ear canal normal.  Left Ear: Tympanic membrane and ear canal normal.  Mouth/Throat: No oropharyngeal exudate or posterior oropharyngeal edema.  Cardiovascular: Normal rate, regular rhythm and normal heart sounds.   No murmur heard. Pulmonary/Chest: Effort normal and breath sounds normal. No respiratory distress. She has no wheezes.  Musculoskeletal: She exhibits no edema.       Cervical back: She exhibits no tenderness.       Thoracic back: She exhibits no tenderness.        Lumbar back: She exhibits no tenderness.  Bilateral LE strength is 5/5.   Lymphadenopathy:    She has no cervical adenopathy.  Neurological: She is alert and oriented to person, place, and time.  Neg straight leg raise  Skin: Skin is warm and dry.  Psychiatric: She has a normal mood and affect. Her behavior is normal. Judgment and thought content normal.          Assessment & Plan:

## 2014-10-25 NOTE — Patient Instructions (Addendum)
Add 1 cap of miralax to 8 ounce of juice today. If no bm repeat tomorrow.  Then as needed.  Continue to push fresh fruits/veggies and water. Call if your constipation does not improve. Start meloxicam as needed for back pain- let me know if symptoms worsen or do not improve. Stop prilosec, add generic zanatac.     Food Choices for Gastroesophageal Reflux Disease When you have gastroesophageal reflux disease (GERD), the foods you eat and your eating habits are very important. Choosing the right foods can help ease the discomfort of GERD. WHAT GENERAL GUIDELINES DO I NEED TO FOLLOW?  Choose fruits, vegetables, whole grains, low-fat dairy products, and low-fat meat, fish, and poultry.  Limit fats such as oils, salad dressings, butter, nuts, and avocado.  Keep a food diary to identify foods that cause symptoms.  Avoid foods that cause reflux. These may be different for different people.  Eat frequent small meals instead of three large meals each day.  Eat your meals slowly, in a relaxed setting.  Limit fried foods.  Cook foods using methods other than frying.  Avoid drinking alcohol.  Avoid drinking large amounts of liquids with your meals.  Avoid bending over or lying down until 2-3 hours after eating. WHAT FOODS ARE NOT RECOMMENDED? The following are some foods and drinks that may worsen your symptoms: Vegetables Tomatoes. Tomato juice. Tomato and spaghetti sauce. Chili peppers. Onion and garlic. Horseradish. Fruits Oranges, grapefruit, and lemon (fruit and juice). Meats High-fat meats, fish, and poultry. This includes hot dogs, ribs, ham, sausage, salami, and bacon. Dairy Whole milk and chocolate milk. Sour cream. Cream. Butter. Ice cream. Cream cheese.  Beverages Coffee and tea, with or without caffeine. Carbonated beverages or energy drinks. Condiments Hot sauce. Barbecue sauce.  Sweets/Desserts Chocolate and cocoa. Donuts. Peppermint and spearmint. Fats and  Oils High-fat foods, including [place] fries and potato chips. Other Vinegar. Strong spices, such as black pepper, white pepper, red pepper, cayenne, curry powder, cloves, ginger, and chili powder. The items listed above may not be a complete list of foods and beverages to avoid. Contact your dietitian for more information. Document Released: 05/26/2005 Document Revised: 05/31/2013 Document Reviewed: 03/30/2013 [facility] Patient Information 2015 [place], [place]. This information is not intended to replace advice given to you by your health care provider. Make sure you discuss any questions you have with your health care provider.

## 2014-10-25 NOTE — Telephone Encounter (Signed)
Caller name:Janifer Relationship to patient:SELF  Can be reached:[phone] Pharmacy:  Reason for call: WOULD LIKE TO [place]

## 2014-10-25 NOTE — Assessment & Plan Note (Signed)
Blood pressure is stable on amlodipine 10mg .  Obtain bmet.

## 2014-10-25 NOTE — Assessment & Plan Note (Signed)
Managed by neuro, on lamictal. Last seizure 2 yrs ago. Obtain follow up LFT's.

## 2014-10-25 NOTE — Assessment & Plan Note (Signed)
Completed therapy and was released by her oncologist.

## 2014-10-27 ENCOUNTER — Telehealth: Payer: Self-pay | Admitting: Family

## 2014-10-27 DIAGNOSIS — E559 Vitamin D deficiency, unspecified: Secondary | ICD-10-CM | POA: Insufficient documentation

## 2014-10-27 DIAGNOSIS — R748 Abnormal levels of other serum enzymes: Secondary | ICD-10-CM

## 2014-10-27 LAB — VITAMIN D 25 HYDROXY (VIT D DEFICIENCY, FRACTURES): VIT D 25 HYDROXY: 25 ng/mL — AB (ref 30–100)

## 2014-10-27 MED ORDER — METHYLPREDNISOLONE 4 MG PO TBPK: ORAL_TABLET | ORAL | Status: DC

## 2014-10-27 MED ORDER — VITAMIN D (ERGOCALCIFEROL) 1.25 MG (50000 UNIT) PO CAPS: 50000.0000 [IU] | ORAL_CAPSULE | ORAL | Status: DC

## 2014-10-27 NOTE — Telephone Encounter (Signed)
Notified pt and she voices understanding. 

## 2014-10-27 NOTE — Telephone Encounter (Signed)
Will rx with medrol dose pak.

## 2014-10-27 NOTE — Telephone Encounter (Signed)
Vit D is low (likely cause for elevated alk phos noted in her labs) I would rec that she start vitamin D 50000 units once weekly x 12 months, then repeat vit D (vit D def)  and lft (elevated alk phos) in 3 months.

## 2014-10-27 NOTE — Telephone Encounter (Signed)
Left message on pt's voicemail to check mychart message and let us know if she has any questions.

## 2014-10-30 ENCOUNTER — Emergency Department (HOSPITAL_BASED_OUTPATIENT_CLINIC_OR_DEPARTMENT_OTHER)
Admission: EM | Admit: 2014-10-30 | Discharge: 2014-10-30 | Disposition: A | Discharge status: Home / Self Care | Payer: Managed Care, Other (non HMO) | Attending: Emergency Medicine | Admitting: Emergency Medicine

## 2014-10-30 ENCOUNTER — Emergency Department (HOSPITAL_BASED_OUTPATIENT_CLINIC_OR_DEPARTMENT_OTHER): Payer: Managed Care, Other (non HMO)

## 2014-10-30 ENCOUNTER — Encounter (HOSPITAL_BASED_OUTPATIENT_CLINIC_OR_DEPARTMENT_OTHER): Payer: Self-pay | Admitting: Emergency Medicine

## 2014-10-30 DIAGNOSIS — Z79899 Other long term (current) drug therapy: Secondary | ICD-10-CM | POA: Insufficient documentation

## 2014-10-30 DIAGNOSIS — I10 Essential (primary) hypertension: Secondary | ICD-10-CM | POA: Insufficient documentation

## 2014-10-30 DIAGNOSIS — M542 Cervicalgia: Secondary | ICD-10-CM | POA: Diagnosis not present

## 2014-10-30 DIAGNOSIS — R220 Localized swelling, mass and lump, head: Secondary | ICD-10-CM | POA: Diagnosis present

## 2014-10-30 DIAGNOSIS — Z8709 Personal history of other diseases of the respiratory system: Secondary | ICD-10-CM | POA: Insufficient documentation

## 2014-10-30 DIAGNOSIS — Z8572 Personal history of non-Hodgkin lymphomas: Secondary | ICD-10-CM | POA: Insufficient documentation

## 2014-10-30 DIAGNOSIS — G40909 Epilepsy, unspecified, not intractable, without status epilepticus: Secondary | ICD-10-CM | POA: Insufficient documentation

## 2014-10-30 DIAGNOSIS — Z791 Long term (current) use of non-steroidal anti-inflammatories (NSAID): Secondary | ICD-10-CM | POA: Diagnosis not present

## 2014-10-30 DIAGNOSIS — E669 Obesity, unspecified: Secondary | ICD-10-CM | POA: Diagnosis not present

## 2014-10-30 DIAGNOSIS — Z3202 Encounter for pregnancy test, result negative: Secondary | ICD-10-CM | POA: Diagnosis not present

## 2014-10-30 LAB — PREGNANCY, URINE: PREG TEST UR: NEGATIVE

## 2014-10-30 MED ORDER — IOHEXOL 300 MG/ML  SOLN
100.0000 mL | Freq: Once | INTRAMUSCULAR | Status: AC | PRN
  Administered 2014-10-30: 100 mL via INTRAVENOUS

## 2014-10-30 NOTE — ED Notes (Signed)
States onset intermittent x 2 weeks.  No difficulty swallowing or breathing

## 2014-10-30 NOTE — ED Notes (Signed)
Patient transported to CT 

## 2014-10-30 NOTE — Discharge Instructions (Signed)
Globus Syndrome  Globus Syndrome is a feeling of a lump or a sensation of something caught in your throat. Eating food or drinking fluids does not seem to get rid of it. Yet it is not noticeable during the actual act of swallowing food or liquids. Usually there is nothing physically wrong. It is troublesome because it is an unpleasant sensation which is sometimes difficult to ignore and at times may seem to worsen. The syndrome is quite common. It is estimated 45% of the population experiences features of the condition at some stage during their lives. The symptoms are usually temporary. The largest group of people who feel the need to seek medical treatment is females between the ages of 30 to 60.   CAUSES   Globus Syndrome appears to be triggered by or aggravated by stress, anxiety and depression.  · Tension related to stress could product abnormal muscle spasms in the esophagus which would account for the sensation of a lump or ball in your throat.  · Frequent swallowing or drying of the throat caused by anxiety or other strong emotions can also produce this uncomfortable sensation in your throat.  · Fear and sadness can be expressed by the body in many ways. For instance, if you had a relative with throat cancer you might become overly concerned about your own health and develop uncomfortable sensations in your throat.  · The reaction to a crisis or a trauma event in your life can take the form of a lump in your throat. It is as if you are indirectly saying you can not handle or "swallow" one more thing.  DIAGNOSIS   Usually your caregiver will know what is wrong by talking to you and examining you.  If the condition persists for several days, more testing may be done to make sure there is not another problem present. This is usually not the case.  TREATMENT   · Reassurance is often the best treatment available. Usually the problem leaves without treatment over several days.  · Sometimes anti-anxiety medications  may be prescribed.  · Counseling or talk therapy can also help with strong underlying emotions.  · Note that in most cases this is not something that keeps coming back and you should not be concerned or worried.  Document Released: 08/16/2003 Document Revised: 08/18/2011 Document Reviewed: 01/13/2008  Exit[employer]® Patient Information ©2015 [employer]. This information is not intended to replace advice given to you by your health care provider. Make sure you discuss any questions you have with your health care provider.

## 2014-10-30 NOTE — ED Notes (Signed)
Patient states that she has pain adn swelling to her right side of her cheek and her throat. The patient reports that it feels like pressure of her trachea. Hx of Lyphoma

## 2014-10-30 NOTE — ED Provider Notes (Signed)
CSN: [id number]     Arrival date & time 10/30/14  1801 History  This chart was scribed for Quintella Reichert, MD by Chester Holstein, ED Scribe. This patient was seen in room MHT13/MHT13 and the patient's care was started at 9:50 PM.    Chief Complaint  Patient presents with  . Oral Swelling    The history is provided by the patient. No language interpreter was used.   HPI Comments: Tiffany [name] is a 49 y.o. female with PMHx of HTN seizure disorder, and Non Hodgkin's lymphoma in remission who presents to the Emergency Department complaining of intermittent right sided neck swelling with first onset 2 weeks ago worsening to constant today. Pt states swelling extends from right cheek down throat. Pt reports associated pressure to her neck.. Pt takes Lamictal and amlodipine daily.  Pt denies dysphagia and fever.   Past Medical History  Diagnosis Date  . Non Hodgkin's lymphoma 2/08    stage III  . Obesity   . Seizure disorder   . Microscopic hematuria   . Hypertension   . Acute pharyngitis 10/28/2012   Past Surgical History  Procedure Laterality Date  . Cesarean section  1998, 2002  . Portacath placement  2008  . Port-a-cath removal  2009   Family History  Problem Relation Age of Onset  . Breast cancer Mother   . Hypertension Mother   . Hypertension Father   . Hypertension Maternal Grandmother   . Hypertension Paternal Grandmother    History  Substance Use Topics  . Smoking status: Never Smoker   . Smokeless tobacco: Never Used  . Alcohol Use: No   OB History    Gravida Para Term Preterm AB TAB SAB Ectopic Multiple Living   2 2             Review of Systems  Constitutional: Negative for fever.  HENT: Positive for facial swelling. Negative for trouble swallowing.   All other systems reviewed and are negative.    Allergies  Review of patient's allergies indicates no known allergies.  Home Medications   Prior to Admission medications   Medication Sig Start  Date End Date Taking? Authorizing Provider  amLODipine (NORVASC) 10 MG tablet Take 1 tablet (10 mg total) by mouth daily. 08/23/14   Lanice Shirts, MD  calcium carbonate (TUMS EX) 750 MG chewable tablet Chew 3 tablets by mouth daily as needed for heartburn (indigestion).    Historical Provider, MD  lamoTRIgine (LAMICTAL) 100 MG tablet Take 1 tablet (100 mg total) by mouth 2 (two) times daily. 08/08/14   Dennie Bible, NP  meloxicam (MOBIC) 7.5 MG tablet Take 1 tablet (7.5 mg total) by mouth daily. 10/25/14   Debbrah Alar, NP  methylPREDNISolone (MEDROL DOSEPAK) 4 MG TBPK tablet Take per package instructions 10/27/14   Debbrah Alar, NP  Multiple Vitamins-Minerals (MULTIVITAMIN WITH MINERALS) tablet Take 1 tablet by mouth daily.    Historical Provider, MD  polyethylene glycol (MIRALAX / GLYCOLAX) packet Take 17 g by mouth daily.    Historical Provider, MD  ranitidine (ZANTAC 75) 75 MG tablet Take 1 tablet (75 mg total) by mouth 2 (two) times daily. 10/25/14   Debbrah Alar, NP  Simethicone (GAS RELIEF PO) Take 2 capsules by mouth daily as needed (gas relief).    Historical Provider, MD  Vitamin D, Ergocalciferol, (DRISDOL) 50000 UNITS CAPS capsule Take 1 capsule (50,000 Units total) by mouth every 7 (seven) days. 10/27/14   Debbrah Alar, NP   BP 137/78  mmHg  Pulse 104  Temp(Src) 98.7 F (37.1 C) (Oral)  Resp 20  Ht 4\' 11"  (1.499 m)  Wt 160 lb (72.576 kg)  BMI 32.30 kg/m2  SpO2 100% Physical Exam  Constitutional: She is oriented to person, place, and time. She appears well-developed and well-nourished.  HENT:  Head: Normocephalic and atraumatic.  Mouth/Throat: Oropharynx is clear and moist.  Neck:  No pathologic lymphadenopathy  Cardiovascular: Normal rate and regular rhythm.   No murmur heard. Pulmonary/Chest: Effort normal and breath sounds normal. No stridor. No respiratory distress.  Abdominal: Soft. There is no tenderness. There is no rebound and no  guarding.  Musculoskeletal: She exhibits no edema or tenderness.  Neurological: She is alert and oriented to person, place, and time.  Skin: Skin is warm and dry.  Psychiatric: She has a normal mood and affect. Her behavior is normal.  Nursing note and vitals reviewed.   ED Course  Procedures (including critical care time) DIAGNOSTIC STUDIES: Oxygen Saturation is 100% on room air, normal by my interpretation.    COORDINATION OF CARE: 9:53 PM Discussed treatment plan with patient at beside, the patient agrees with the plan and has no further questions at this time.   Labs Review Labs Reviewed  PREGNANCY, URINE    Imaging Review Ct Soft Tissue Neck W Contrast  10/30/2014   CLINICAL DATA:  Right neck swelling for 2 weeks, worsening today. Swelling extends from the right cheek to the throat. History of non-Hodgkin's lymphoma, in remission.  EXAM: CT NECK WITH CONTRAST  TECHNIQUE: Multidetector CT imaging of the neck was performed using the standard protocol following the bolus administration of intravenous contrast.  CONTRAST:  139mL OMNIPAQUE IOHEXOL 300 MG/ML  SOLN  COMPARISON:  12/14/2012  FINDINGS: Pharynx and larynx: Stable appearance. No evidence of mass lesion or inflammatory enhancement.  Salivary glands: Unremarkable  Thyroid: Unremarkable  Lymph nodes: Stable size and appearance of cervical lymph nodes. No evidence of lymphoma recurrence.  Vascular: Major vessels are patent. There is mild relative narrowing of the bilateral mid cervical ICA without change from 2014.  Limited intracranial: Unremarkable  Visualized orbits: Unremarkable  Mastoids and visualized paranasal sinuses: Clear  Skeleton: Unremarkable  Upper chest: A 4 mm pulmonary nodule in the right upper lobe is stable from 2014, best visualized coronally.  IMPRESSION: Stable appearance of the neck since 2014. No explanation for right neck swelling.   Electronically Signed   By: Monte Fantasia M.D.   On: 10/30/2014 23:07      EKG Interpretation None      MDM   Final diagnoses:  Neck pain on right side   Patient here for evaluation of right-sided neck discomfort. There is no evidence of lymphadenopathy on exam, no stridor or respiratory distress. Imaging negative for any deep tissue process or paratracheal mass. Offered patient reassurance and recommend ibuprofen when necessary discomfort. Return precautions were discussed.  I personally performed the services described in this documentation, which was scribed in my presence. The recorded information has been reviewed and is accurate.     Quintella Reichert, MD 10/31/14 [phone]

## 2014-10-31 ENCOUNTER — Telehealth: Payer: Self-pay | Admitting: Family

## 2014-10-31 NOTE — Telephone Encounter (Signed)
Please contact pt to arrange ED follow up.

## 2014-10-31 NOTE — Telephone Encounter (Signed)
Appointment scheduled for 12/04/14

## 2014-12-04 ENCOUNTER — Ambulatory Visit (INDEPENDENT_AMBULATORY_CARE_PROVIDER_SITE_OTHER): Payer: Managed Care, Other (non HMO) | Admitting: Family

## 2014-12-04 ENCOUNTER — Encounter: Payer: Self-pay | Admitting: Family

## 2014-12-04 VITALS — BP 128/88 | HR 104 | Temp 98.2°F | Ht 59.0 in | Wt 163.1 lb

## 2014-12-04 DIAGNOSIS — Z87898 Personal history of other specified conditions: Secondary | ICD-10-CM | POA: Diagnosis not present

## 2014-12-04 NOTE — Progress Notes (Signed)
Subjective:    Patient ID: Tiffany [name], female    DOB: 1965/07/21, 49 y.o.   MRN: [id number]  HPI  Tiffany [name] is a 49 yr old female who presents today for ED follow up.   She was seen in the ED on 10/30/14 with oral swelling and right sided neck pain.  She had a CT of the neck which was unremarkable. She described the previous  Discomfort as "pressure." Reports symptoms have completely resolved.  Denies dental pain.   Review of Systems See HPI  Past Medical History  Diagnosis Date  . Non Hodgkin's lymphoma 2/08    stage III  . Obesity   . Seizure disorder   . Microscopic hematuria   . Hypertension   . Acute pharyngitis 10/28/2012    History   Social History  . Marital Status: Married    Spouse Name: Tiffany [name]  . Number of Children: 2  . Years of Education: college   Occupational History  . PHLEBOTOMIST    Social History Main Topics  . Smoking status: Never Smoker   . Smokeless tobacco: Never Used  . Alcohol Use: No  . Drug Use: No  . Sexual Activity:    Partners: Male   Other Topics Concern  . Not on file   Social History Narrative    Past Surgical History  Procedure Laterality Date  . Cesarean section  1998, 2002  . Portacath placement  2008  . Port-a-cath removal  2009    Family History  Problem Relation Age of Onset  . Breast cancer Mother   . Hypertension Mother   . Hypertension Father   . Hypertension Maternal Grandmother   . Hypertension Paternal Grandmother     No Known Allergies  Current Outpatient Prescriptions on File Prior to Visit  Medication Sig Dispense Refill  . amLODipine (NORVASC) 10 MG tablet Take 1 tablet (10 mg total) by mouth daily. 90 tablet 1  . calcium carbonate (TUMS EX) 750 MG chewable tablet Chew 3 tablets by mouth daily as needed for heartburn (indigestion).    [name] lamoTRIgine (LAMICTAL) 100 MG tablet Take 1 tablet (100 mg total) by mouth 2 (two) times daily. 180 tablet 3  . meloxicam (MOBIC) 7.5 MG tablet  Take 1 tablet (7.5 mg total) by mouth daily. 14 tablet 0  . methylPREDNISolone (MEDROL DOSEPAK) 4 MG TBPK tablet Take per package instructions 21 tablet 0  . Multiple Vitamins-Minerals (MULTIVITAMIN WITH MINERALS) tablet Take 1 tablet by mouth daily.    . polyethylene glycol (MIRALAX / GLYCOLAX) packet Take 17 g by mouth daily.    . ranitidine (ZANTAC 75) 75 MG tablet Take 1 tablet (75 mg total) by mouth 2 (two) times daily. 60 tablet   . Simethicone (GAS RELIEF PO) Take 2 capsules by mouth daily as needed (gas relief).    . Vitamin D, Ergocalciferol, (DRISDOL) 50000 UNITS CAPS capsule Take 1 capsule (50,000 Units total) by mouth every 7 (seven) days. 12 capsule 0   No current facility-administered medications on file prior to visit.    BP 128/88 mmHg  Pulse 104  Temp(Src) 98.2 F (36.8 C) (Oral)  Ht 4\' 11"  (1.499 m)  Wt 163 lb 2 oz (73.993 kg)  BMI 32.93 kg/m2  SpO2 99%       Objective:   Physical Exam  Constitutional: She is oriented to person, place, and time. She appears well-developed and well-nourished.  HENT:  Head: Normocephalic and atraumatic.  Neck: Neck supple. No thyromegaly present.  Cardiovascular: Normal rate, regular rhythm and normal heart sounds.   No murmur heard. Pulmonary/Chest: Effort normal and breath sounds normal. No respiratory distress. She has no wheezes.  Lymphadenopathy:    She has no cervical adenopathy.  Neurological: She is alert and oriented to person, place, and time.  Psychiatric: She has a normal mood and affect. Her behavior is normal. Judgment and thought content normal.          Assessment & Plan:

## 2014-12-04 NOTE — Patient Instructions (Signed)
Follow up in November for a complete physical.

## 2014-12-04 NOTE — Progress Notes (Signed)
Pre visit review using our clinic review tool, if applicable. No additional management support is needed unless otherwise documented below in the visit note. 

## 2014-12-04 NOTE — Assessment & Plan Note (Signed)
Resolved.  Exam normal. Monitor.

## 2015-01-20 ENCOUNTER — Other Ambulatory Visit: Payer: Self-pay | Admitting: Family

## 2015-01-22 NOTE — Telephone Encounter (Signed)
Vitamin D refill denied.  Pt needs to complete vitamin D and alkaline phosphatase before further refills can be given.  Pt does not need to be fasting.  Please call pt to arrange lab appt around 01/27/15 or after.

## 2015-01-23 NOTE — Telephone Encounter (Signed)
lvm advising patient of physicians instruction

## 2015-01-23 NOTE — Telephone Encounter (Signed)
Letter mailed to pt.  

## 2015-01-29 ENCOUNTER — Telehealth: Payer: Self-pay | Admitting: Family

## 2015-01-29 DIAGNOSIS — R748 Abnormal levels of other serum enzymes: Secondary | ICD-10-CM

## 2015-01-29 DIAGNOSIS — E559 Vitamin D deficiency, unspecified: Secondary | ICD-10-CM

## 2015-01-29 NOTE — Telephone Encounter (Signed)
Left a message for call back.  

## 2015-01-29 NOTE — Telephone Encounter (Signed)
Future orders for [place] [facility] cancelled and orders placed for [facility].  Notified pt.

## 2015-01-29 NOTE — Telephone Encounter (Signed)
Relation to pt: self Call back number: [phone]   Reason for call:  patient requesting lab orders to be taken at [facility] lab upstairs in the med center. Please advise

## 2015-02-01 ENCOUNTER — Other Ambulatory Visit: Payer: Self-pay | Admitting: Family

## 2015-02-01 DIAGNOSIS — R748 Abnormal levels of other serum enzymes: Secondary | ICD-10-CM

## 2015-02-01 DIAGNOSIS — E559 Vitamin D deficiency, unspecified: Secondary | ICD-10-CM

## 2015-02-01 LAB — ALKALINE PHOSPHATASE: ALK PHOS: 111 U/L (ref 33–115)

## 2015-02-02 LAB — VITAMIN D 25 HYDROXY (VIT D DEFICIENCY, FRACTURES): Vit D, 25-Hydroxy: 43 ng/mL (ref 30–100)

## 2015-02-03 ENCOUNTER — Other Ambulatory Visit: Payer: Self-pay | Admitting: Family

## 2015-02-03 MED ORDER — VITAMIN D3 75 MCG (3000 UT) PO TABS
1.0000 | ORAL_TABLET | Freq: Every day | ORAL | Status: DC
Start: 2015-02-03 — End: 2015-08-08

## 2015-04-11 ENCOUNTER — Other Ambulatory Visit: Payer: Self-pay

## 2015-04-11 DIAGNOSIS — Z1231 Encounter for screening mammogram for malignant neoplasm of breast: Secondary | ICD-10-CM

## 2015-05-08 ENCOUNTER — Ambulatory Visit: Payer: Managed Care, Other (non HMO)

## 2015-05-14 ENCOUNTER — Ambulatory Visit: Payer: Managed Care, Other (non HMO)

## 2015-05-24 ENCOUNTER — Ambulatory Visit
Admission: RE | Admit: 2015-05-24 | Discharge: 2015-05-24 | Disposition: A | Discharge status: Home / Self Care | Payer: Managed Care, Other (non HMO) | Source: Ambulatory Visit

## 2015-05-24 DIAGNOSIS — Z1231 Encounter for screening mammogram for malignant neoplasm of breast: Secondary | ICD-10-CM

## 2015-08-08 ENCOUNTER — Encounter: Payer: Self-pay | Admitting: Nurse Practitioner

## 2015-08-08 ENCOUNTER — Other Ambulatory Visit (HOSPITAL_COMMUNITY)
Admission: RE | Admit: 2015-08-08 | Discharge: 2015-08-08 | Disposition: A | Discharge status: Home / Self Care | Payer: Managed Care, Other (non HMO) | Source: Ambulatory Visit | Attending: Internal Medicine | Admitting: Internal Medicine

## 2015-08-08 ENCOUNTER — Other Ambulatory Visit: Payer: Self-pay | Admitting: Internal Medicine

## 2015-08-08 ENCOUNTER — Ambulatory Visit (INDEPENDENT_AMBULATORY_CARE_PROVIDER_SITE_OTHER): Payer: Managed Care, Other (non HMO) | Admitting: Nurse Practitioner

## 2015-08-08 VITALS — BP 123/78 | HR 98 | Ht 59.0 in | Wt 165.2 lb

## 2015-08-08 DIAGNOSIS — G40309 Generalized idiopathic epilepsy and epileptic syndromes, not intractable, without status epilepticus: Secondary | ICD-10-CM | POA: Diagnosis not present

## 2015-08-08 DIAGNOSIS — Z01411 Encounter for gynecological examination (general) (routine) with abnormal findings: Secondary | ICD-10-CM | POA: Diagnosis present

## 2015-08-08 DIAGNOSIS — I10 Essential (primary) hypertension: Secondary | ICD-10-CM | POA: Diagnosis not present

## 2015-08-08 DIAGNOSIS — Z1151 Encounter for screening for human papillomavirus (HPV): Secondary | ICD-10-CM | POA: Insufficient documentation

## 2015-08-08 MED ORDER — LAMOTRIGINE 100 MG PO TABS: 100.0000 mg | ORAL_TABLET | Freq: Two times a day (BID) | ORAL | Status: DC

## 2015-08-08 NOTE — Patient Instructions (Signed)
Continue Lamictal at current dose will refill for 3 months with 3 refills Follow-up yearly and when necessary Call for any seizure activity 

## 2015-08-08 NOTE — Progress Notes (Signed)
[facility]  PATIENT: Tiffany [name] DOB: [birth year]   REASON FOR VISIT: follow-up for generalized epilepsy HISTORY FROM:patient    HISTORY OF PRESENT ILLNESS:[name] , 50 year old returns for follow up after her last visit 08/08/14. She has a history of generalized seizure disorder and no  seizures in 4 years.  She is tolerating Lamictal without difficulty. The MRI brain w/wo on 05/10/09 at [facility] which was normal. EEG 03/26/09 performed in awake and drowsy states was normal as well. She has no new complaints today.She returns for reevaluation and refills    REVIEW OF SYSTEMS: Full 14 system review of systems performed and notable only for those listed, all others are neg:  Constitutional: neg  Cardiovascular: neg Ear/Nose/Throat: neg  Skin: neg Eyes: neg Respiratory: neg Gastroitestinal: neg  Hematology/Lymphatic: neg  Endocrine: neg Musculoskeletal:neg Allergy/Immunology: neg Neurological: neg Psychiatric: neg Sleep : neg   ALLERGIES: No Known Allergies  HOME MEDICATIONS: Outpatient Prescriptions Prior to Visit  Medication Sig Dispense Refill  . amLODipine (NORVASC) 10 MG tablet Take 1 tablet (10 mg total) by mouth daily. 90 tablet 1  . lamoTRIgine (LAMICTAL) 100 MG tablet Take 1 tablet (100 mg total) by mouth 2 (two) times daily. 180 tablet 3  . Multiple Vitamins-Minerals (MULTIVITAMIN WITH MINERALS) tablet Take 1 tablet by mouth daily.    . calcium carbonate (TUMS EX) 750 MG chewable tablet Chew 3 tablets by mouth daily as needed for heartburn (indigestion). Reported on 08/08/2015    . Cholecalciferol (VITAMIN D3) 3000 UNITS TABS Take 1 tablet by mouth daily. (Patient not taking: Reported on 08/08/2015)    . meloxicam (MOBIC) 7.5 MG tablet Take 1 tablet (7.5 mg total) by mouth daily. (Patient not taking: Reported on 08/08/2015) 14 tablet 0  . methylPREDNISolone (MEDROL DOSEPAK) 4 MG TBPK tablet Take per package instructions  (Patient not taking: Reported on 08/08/2015) 21 tablet 0  . polyethylene glycol (MIRALAX / GLYCOLAX) packet Take 17 g by mouth daily. Reported on 08/08/2015    . ranitidine (ZANTAC 75) 75 MG tablet Take 1 tablet (75 mg total) by mouth 2 (two) times daily. (Patient not taking: Reported on 08/08/2015) 60 tablet   . Simethicone (GAS RELIEF PO) Take 2 capsules by mouth daily as needed (gas relief). Reported on 08/08/2015     No facility-administered medications prior to visit.    PAST MEDICAL HISTORY: Past Medical History  Diagnosis Date  . Non Hodgkin's lymphoma ([place]) 2/08    stage III  . Obesity   . Seizure disorder ([place])   . Microscopic hematuria   . Hypertension   . Acute pharyngitis 10/28/2012    PAST SURGICAL HISTORY: Past Surgical History  Procedure Laterality Date  . Cesarean section  1998, 2002  . Portacath placement  2008  . Port-a-cath removal  2009    FAMILY HISTORY: Family History  Problem Relation Age of Onset  . Breast cancer Mother   . Hypertension Mother   . Hypertension Father   . Hypertension Maternal Grandmother   . Hypertension Paternal Grandmother     SOCIAL HISTORY: Social History   Social History  . Marital Status: Married    Spouse Name: Iona Beard  . Number of Children: 2  . Years of Education: college   Occupational History  . PHLEBOTOMIST    Social History Main Topics  . Smoking status: Never Smoker   . Smokeless tobacco: Never Used  . Alcohol Use: No  . Drug Use: No  .  Sexual Activity:    Partners: Male   Other Topics Concern  . Not on file   Social History Narrative     PHYSICAL EXAM  Filed Vitals:   08/08/15 0908  BP: 123/78  Pulse: 98  Height: 4\' 11"  (1.499 m)  Weight: 165 lb 3.2 oz (74.934 kg)   Body mass index is 33.35 kg/(m^2). Generalized: Well developed, in no acute distress  Head: normocephalic and atraumatic,. Oropharynx benign  Musculoskeletal: No deformity   Neurological examination   Mentation: Alert oriented  to time, place, history taking. Attention span and concentration appropriate. Recent and remote memory intact. Follows all commands speech and language fluent.   Cranial nerve II-XII: Fundoscopic exam not done Pupils were equal round reactive to light extraocular movements were full, visual field were full on confrontational test. Facial sensation and strength were normal. hearing was intact to finger rubbing bilaterally. Uvula tongue midline. head turning and shoulder shrug were normal and symmetric.Tongue protrusion into cheek strength was normal. Motor: normal bulk and tone, full strength in the BUE, BLE, fine finger movements normal, no pronator drift. No focal weakness Sensory: normal and symmetric to light touch, pinprick, and Vibration,  Coordination: finger-nose-finger, heel-to-shin bilaterally, no dysmetria Reflexes: Brachioradialis 2/2, biceps 2/2, triceps 2/2, patellar 2/2, Achilles 2/2, plantar responses were flexor bilaterally. Gait and Station: Rising up from seated position without assistance, normal stance, moderate stride, good arm swing, smooth turning, able to perform tiptoe, and heel walking without difficulty. Tandem gait is steady  DIAGNOSTIC DATA (LABS, IMAGING, TESTING) -  ASSESSMENT AND PLAN  50 y.o. year old female  has a past medical history of Non Hodgkin's lymphoma ([place]) (2/08); Obesity; Seizure disorder ([place]); Hypertension; . here to follow-up. Last seizure was 4 years ago.  Continue Lamictal at current dose will refill for 3 months with 3 refills Follow-up yearly and when necessary Call for any seizure activity Dennie Bible, [facility], [facility], [facility] [address], [place], [place] [address] [phone]

## 2015-08-10 LAB — CYTOLOGY - PAP

## 2015-08-10 NOTE — Progress Notes (Signed)
I agree with the above plan 

## 2015-09-05 ENCOUNTER — Other Ambulatory Visit: Payer: Self-pay | Admitting: Neurology

## 2015-11-27 ENCOUNTER — Emergency Department (HOSPITAL_BASED_OUTPATIENT_CLINIC_OR_DEPARTMENT_OTHER): Payer: Managed Care, Other (non HMO)

## 2015-11-27 ENCOUNTER — Emergency Department (HOSPITAL_BASED_OUTPATIENT_CLINIC_OR_DEPARTMENT_OTHER)
Admission: EM | Admit: 2015-11-27 | Discharge: 2015-11-27 | Disposition: A | Discharge status: Home / Self Care | Payer: Managed Care, Other (non HMO) | Attending: Emergency Medicine | Admitting: Emergency Medicine

## 2015-11-27 ENCOUNTER — Encounter (HOSPITAL_BASED_OUTPATIENT_CLINIC_OR_DEPARTMENT_OTHER): Payer: Self-pay | Admitting: Emergency Medicine

## 2015-11-27 DIAGNOSIS — N939 Abnormal uterine and vaginal bleeding, unspecified: Secondary | ICD-10-CM | POA: Diagnosis present

## 2015-11-27 DIAGNOSIS — E669 Obesity, unspecified: Secondary | ICD-10-CM | POA: Diagnosis not present

## 2015-11-27 DIAGNOSIS — I10 Essential (primary) hypertension: Secondary | ICD-10-CM | POA: Diagnosis not present

## 2015-11-27 DIAGNOSIS — N938 Other specified abnormal uterine and vaginal bleeding: Secondary | ICD-10-CM | POA: Diagnosis not present

## 2015-11-27 DIAGNOSIS — Z79899 Other long term (current) drug therapy: Secondary | ICD-10-CM | POA: Insufficient documentation

## 2015-11-27 DIAGNOSIS — R109 Unspecified abdominal pain: Secondary | ICD-10-CM | POA: Insufficient documentation

## 2015-11-27 DIAGNOSIS — Z6832 Body mass index (BMI) 32.0-32.9, adult: Secondary | ICD-10-CM | POA: Diagnosis not present

## 2015-11-27 LAB — URINALYSIS, ROUTINE W REFLEX MICROSCOPIC
BILIRUBIN URINE: NEGATIVE
Glucose, UA: NEGATIVE mg/dL
Ketones, ur: NEGATIVE mg/dL
LEUKOCYTES UA: NEGATIVE
NITRITE: NEGATIVE
PH: 7 (ref 5.0–8.0)
Protein, ur: NEGATIVE mg/dL
SPECIFIC GRAVITY, URINE: 1.002 — AB (ref 1.005–1.030)

## 2015-11-27 LAB — PREGNANCY, URINE: Preg Test, Ur: NEGATIVE

## 2015-11-27 LAB — URINE MICROSCOPIC-ADD ON: WBC UA: NONE SEEN WBC/hpf (ref 0–5)

## 2015-11-27 NOTE — ED Notes (Signed)
Provider is now at bedside

## 2015-11-27 NOTE — ED Notes (Signed)
Pt verbalizes understanding of d/c instructions and denies any further needs at this time. 

## 2015-11-27 NOTE — ED Notes (Signed)
Patient transported to Ultrasound 

## 2015-11-27 NOTE — ED Notes (Signed)
patient states that she is having bleeding when she wipes after going to bathroom. Patient states that r/t to chemo she stopped having her periods

## 2015-11-27 NOTE — ED Notes (Signed)
Pt declines GC/Chlamydia test, Dr. Johnney Killian aware

## 2015-11-27 NOTE — Discharge Instructions (Signed)

## 2015-11-27 NOTE — ED Provider Notes (Signed)
CSN: [id number]     Arrival date & time 11/27/15  1858 History  By signing my name below, I, [facility], attest that this documentation has been prepared under the direction and in the presence of Charlesetta Shanks, MD. Electronically Signed: Virgel Bouquet, ED Scribe. 11/27/2015. 10:23 PM.   Chief Complaint  Patient presents with  . Vaginal Bleeding    The history is provided by the patient. No language interpreter was used.   HPI Comments: Tiffany [name] is a 50 y.o. female with an hx of non Hodgkin's lymphoma, microscopic hematuria, and HTN who presents to the Emergency Department complaining of constant, mild, unchanging vaginal bleeding onset 2 days ago. Pt states that she had lower abdominal cramping followed by scant vaginal bleeding that is only present when she wipes herself. She notes that she has an hx of stage III lymphoma (in remission) that was treated with chemotherapy after which her periods ceased and she was told she would enter menopause. Denies fevers, chills, nausea, vomiting, or any other symptoms currently.  Past Medical History  Diagnosis Date  . Non Hodgkin's lymphoma ([place]) 2/08    stage III  . Obesity   . Seizure disorder ([place])   . Microscopic hematuria   . Hypertension   . Acute pharyngitis 10/28/2012   Past Surgical History  Procedure Laterality Date  . Cesarean section  1998, 2002  . Portacath placement  2008  . Port-a-cath removal  2009   Family History  Problem Relation Age of Onset  . Breast cancer Mother   . Hypertension Mother   . Hypertension Father   . Hypertension Maternal Grandmother   . Hypertension Paternal Grandmother    Social History  Substance Use Topics  . Smoking status: Never Smoker   . Smokeless tobacco: Never Used  . Alcohol Use: No   OB History    Gravida Para Term Preterm AB TAB SAB Ectopic Multiple Living   2 2             Review of Systems  Constitutional: Negative for fever and chills.   Gastrointestinal: Positive for abdominal pain. Negative for nausea and vomiting.  Genitourinary: Positive for vaginal bleeding.  All other systems reviewed and are negative.     Allergies  Review of patient's allergies indicates no known allergies.  Home Medications   Prior to Admission medications   Medication Sig Start Date End Date Taking? Authorizing Provider  amLODipine (NORVASC) 10 MG tablet Take 1 tablet (10 mg total) by mouth daily. 08/23/14   Lanice Shirts, MD  lamoTRIgine (LAMICTAL) 100 MG tablet Take 1 tablet (100 mg total) by mouth 2 (two) times daily. 08/08/15   Dennie Bible, NP  Multiple Vitamins-Minerals (MULTIVITAMIN WITH MINERALS) tablet Take 1 tablet by mouth daily.    Historical Provider, MD   BP 135/80 mmHg  Pulse 101  Temp(Src) 98.3 F (36.8 C) (Oral)  Resp 16  Ht 4\' 11"  (1.499 m)  Wt 160 lb (72.576 kg)  BMI 32.30 kg/m2  SpO2 100% Physical Exam  Constitutional: She is oriented to person, place, and time. She appears well-developed and well-nourished. No distress.  HENT:  Head: Normocephalic and atraumatic.  Eyes: Conjunctivae are normal.  Neck: Normal range of motion.  Cardiovascular: Normal rate and regular rhythm.  Exam reveals no gallop and no friction rub.   No murmur heard. Heart regular without murmurs, rubs, or gallops.  Pulmonary/Chest: Effort normal and breath sounds normal. No respiratory distress. She has no wheezes. She  has no rhonchi. She has no rales.  Lungs CTA bilaterally without wheezes, rhonchi, and rales.  Abdominal: Soft.  Genitourinary:  Normal external female genitalia. Speculum exam, cervix is normal no bleeding. No masses or lesions.  Musculoskeletal: Normal range of motion. She exhibits no edema or tenderness.  No peripheral edema. Calves non-tender.  Neurological: She is alert and oriented to person, place, and time.  Skin: Skin is warm and dry.  Psychiatric: She has a normal mood and affect. Her behavior is  normal.    ED Course  Procedures  DIAGNOSTIC STUDIES: Oxygen Saturation is 100% on RA, normal by my interpretation.    COORDINATION OF CARE: 8:44 PM Will order [place] scans of transvaginal and pelvis. Will return to perform pelvic exam. Discussed treatment plan with pt at bedside and pt agreed to plan.  10:23 PM Returned to perform pelvic exam.  Labs Review Labs Reviewed  URINALYSIS, ROUTINE W REFLEX MICROSCOPIC (NOT AT [facility]) - Abnormal; Notable for the following:    Specific Gravity, Urine 1.002 (*)    Hgb urine dipstick MODERATE (*)    All other components within normal limits  URINE MICROSCOPIC-ADD ON - Abnormal; Notable for the following:    Squamous Epithelial / LPF 0-5 (*)    Bacteria, UA RARE (*)    All other components within normal limits  PREGNANCY, URINE    Imaging Review No results found. I have personally reviewed and evaluated these images and lab results as part of my medical decision-making.   EKG Interpretation None      MDM   Final diagnoses:  DUB (dysfunctional uterine bleeding)   Patient is clinically well appearance. She does not have lightheadedness or near-syncope. Vital signs are stable. Pelvic exam shows no active bleeding. At this time, patient is safe for follow-up with her gynecologist for further evaluation of vaginal bleeding. Patient thought she was menopausal. At this time this will need further evaluation. She also has known uterine fibroids.   Charlesetta Shanks, MD 11/27/15 2228

## 2016-08-07 ENCOUNTER — Encounter: Payer: Self-pay | Admitting: Nurse Practitioner

## 2016-08-07 ENCOUNTER — Ambulatory Visit (INDEPENDENT_AMBULATORY_CARE_PROVIDER_SITE_OTHER): Payer: 59 | Admitting: Nurse Practitioner

## 2016-08-07 VITALS — BP 126/84 | HR 96 | Resp 16 | Ht 59.0 in | Wt 172.0 lb

## 2016-08-07 DIAGNOSIS — G40309 Generalized idiopathic epilepsy and epileptic syndromes, not intractable, without status epilepticus: Secondary | ICD-10-CM | POA: Diagnosis not present

## 2016-08-07 MED ORDER — LAMOTRIGINE 100 MG PO TABS: 100.0000 mg | ORAL_TABLET | Freq: Two times a day (BID) | ORAL | 3 refills | Status: DC

## 2016-08-07 NOTE — Patient Instructions (Signed)
Continue Lamictal at current dose will refill for 3 months with 3 refills Follow-up yearly and when necessary Call for any seizure activity 

## 2016-08-07 NOTE — Progress Notes (Signed)
[facility]  PATIENT: Tiffany [name] DOB: [birth year]   REASON FOR VISIT: follow-up for generalized epilepsy HISTORY FROM:patient    HISTORY OF PRESENT ILLNESS:UPDATE 08/07/16 CM Tiffany [name] , 51 year old female returns for yearly follow-up. She has a history of generalized seizure disorder. Her last seizure was approximately 5 years ago. She is currently on Lamictal tolerating the medication without difficulty. EEG in the past has been normal as well as MRI of the brain. She has not had any interval medical problems. She is overweight. She claims she loves breads, pasta and sweets She returns for reevaluation and refills  REVIEW OF SYSTEMS: Full 14 system review of systems performed and notable only for those listed, all others are neg:  Constitutional: neg  Cardiovascular: neg Ear/Nose/Throat: neg  Skin: neg Eyes: neg Respiratory: neg Gastroitestinal: neg  Hematology/Lymphatic: neg  Endocrine: neg Musculoskeletal:neg Allergy/Immunology: neg Neurological: neg Psychiatric: neg Sleep : neg   ALLERGIES: No Known Allergies  HOME MEDICATIONS: Outpatient Medications Prior to Visit  Medication Sig Dispense Refill  . amLODipine (NORVASC) 10 MG tablet Take 1 tablet (10 mg total) by mouth daily. 90 tablet 1  . lamoTRIgine (LAMICTAL) 100 MG tablet Take 1 tablet (100 mg total) by mouth 2 (two) times daily. 180 tablet 3  . Multiple Vitamins-Minerals (MULTIVITAMIN WITH MINERALS) tablet Take 1 tablet by mouth daily.     No facility-administered medications prior to visit.     PAST MEDICAL HISTORY: Past Medical History:  Diagnosis Date  . Acute pharyngitis 10/28/2012  . Hypertension   . Microscopic hematuria   . Non Hodgkin's lymphoma ([place]) 2/08   stage III  . Obesity   . Seizure disorder ([place])     PAST SURGICAL HISTORY: Past Surgical History:  Procedure Laterality Date  . [place], 2002  . PORT-A-CATH REMOVAL  2009  . PORTACATH  PLACEMENT  2008    FAMILY HISTORY: Family History  Problem Relation Age of Onset  . Breast cancer Mother   . Hypertension Mother   . Hypertension Father   . Hypertension Maternal Grandmother   . Hypertension Paternal Grandmother     SOCIAL HISTORY: Social History   Social History  . Marital status: Married    Spouse name: Iona Beard  . Number of children: 2  . 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 Smoking status: Never Smoker  . Smokeless tobacco: Never Used  . Alcohol use No  . Drug use: No  . Sexual activity: Yes    Partners: Male   Other Topics Concern  . Not on file   Social History Narrative  . No narrative on file     PHYSICAL EXAM  Vitals:   08/07/16 0741  BP: 126/84  Pulse: 96  Resp: 16  Weight: 172 lb (78 kg)  Height: 4\' 11"  (1.499 m)   Body mass index is 34.74 kg/m. Generalized: Well developed, Obese female in no acute distress  Head: normocephalic and atraumatic,. Oropharynx benign  Musculoskeletal: No deformity   Neurological examination   Mentation: Alert oriented to time, place, history taking. Attention span and concentration appropriate. Recent and remote memory intact. Follows all commands speech and language fluent.   Cranial nerve II-XII: Pupils were equal round reactive to light extraocular movements were full, visual field were full on confrontational test. Facial sensation and strength were normal. hearing was intact to finger rubbing bilaterally. Uvula tongue midline. head turning  and shoulder shrug were normal and symmetric.Tongue protrusion into cheek strength was normal. Motor: normal bulk and tone, full strength in the BUE, BLE, fine finger movements normal, no pronator drift. Sensory: normal and symmetric to light touch, pinprick, and Vibration in the upper and lower extremities,  Coordination: finger-nose-finger, heel-to-shin bilaterally, no  dysmetria, no tremor Reflexes: Symmetric upper and lower plantar responses were flexor bilaterally. Gait and Station: Rising up from seated position without assistance, normal stance, moderate stride, good arm swing, smooth turning, able to perform tiptoe, and heel walking without difficulty. Tandem gait is steady  DIAGNOSTIC DATA (LABS, IMAGING, TESTING) -  ASSESSMENT AND PLAN  51 y.o. year old female  has a past medical history of Non Hodgkin's lymphoma ([place]) (2/08); Obesity; Seizure disorder ([place]); Hypertension; . here to follow-up. Last seizure was 5 years ago.  Continue Lamictal at current dose will refill for 3 months with 3 refills Follow-up yearly and when necessary Call for any seizure activity Try to limit carbs  and get regular exercise for weight loss Dennie Bible, [facility], [facility], APRN  [facility] [address], [place], [place] [address] [phone]

## 2016-08-08 NOTE — Progress Notes (Signed)
I agree with the above plan 

## 2016-09-22 ENCOUNTER — Ambulatory Visit (INDEPENDENT_AMBULATORY_CARE_PROVIDER_SITE_OTHER): Payer: 59 | Admitting: Family

## 2016-09-22 ENCOUNTER — Encounter: Payer: Self-pay | Admitting: Family

## 2016-09-22 VITALS — BP 127/80 | HR 102 | Temp 98.2°F | Resp 16 | Ht 60.5 in | Wt 169.6 lb

## 2016-09-22 DIAGNOSIS — Z1239 Encounter for other screening for malignant neoplasm of breast: Secondary | ICD-10-CM

## 2016-09-22 DIAGNOSIS — Z0000 Encounter for general adult medical examination without abnormal findings: Secondary | ICD-10-CM

## 2016-09-22 DIAGNOSIS — E2839 Other primary ovarian failure: Secondary | ICD-10-CM

## 2016-09-22 LAB — CBC WITH DIFFERENTIAL/PLATELET
BASOS PCT: 0 %
Basophils Absolute: 0 cells/uL (ref 0–200)
EOS ABS: 240 {cells}/uL (ref 15–500)
Eosinophils Relative: 3 %
HCT: 40.9 % (ref 35.0–45.0)
Hemoglobin: 13.1 g/dL (ref 11.7–15.5)
LYMPHS PCT: 30 %
Lymphs Abs: 2400 cells/uL (ref 850–3900)
MCH: 25.3 pg — ABNORMAL LOW (ref 27.0–33.0)
MCHC: 32 g/dL (ref 32.0–36.0)
MCV: 79.1 fL — AB (ref 80.0–100.0)
MONOS PCT: 7 %
MPV: 9.1 fL (ref 7.5–12.5)
Monocytes Absolute: 560 cells/uL (ref 200–950)
NEUTROS PCT: 60 %
Neutro Abs: 4800 cells/uL (ref 1500–7800)
PLATELETS: 451 10*3/uL — AB (ref 140–400)
RBC: 5.17 MIL/uL — AB (ref 3.80–5.10)
RDW: 14.2 % (ref 11.0–15.0)
WBC: 8 10*3/uL (ref 3.8–10.8)

## 2016-09-22 LAB — LIPID PANEL
CHOLESTEROL: 178 mg/dL (ref <200)
HDL: 57 mg/dL (ref >50)
LDL CALC: 99 mg/dL (ref <100)
TRIGLYCERIDES: 109 mg/dL (ref <150)
Total CHOL/HDL Ratio: 3.1 Ratio (ref <5.0)
VLDL: 22 mg/dL (ref <30)

## 2016-09-22 LAB — BASIC METABOLIC PANEL
BUN: 13 mg/dL (ref 7–25)
CHLORIDE: 103 mmol/L (ref 98–110)
CO2: 30 mmol/L (ref 20–31)
Calcium: 10.3 mg/dL — ABNORMAL HIGH (ref 8.6–10.2)
Creat: 0.73 mg/dL (ref 0.50–1.10)
GLUCOSE: 83 mg/dL (ref 65–99)
POTASSIUM: 5.2 mmol/L (ref 3.5–5.3)
Sodium: 139 mmol/L (ref 135–146)

## 2016-09-22 LAB — HEPATIC FUNCTION PANEL
ALT: 15 U/L (ref 6–29)
AST: 15 U/L (ref 10–35)
Albumin: 4.3 g/dL (ref 3.6–5.1)
Alkaline Phosphatase: 107 U/L (ref 33–115)
BILIRUBIN INDIRECT: 0.3 mg/dL (ref 0.2–1.2)
Bilirubin, Direct: 0.1 mg/dL (ref <=0.2)
TOTAL PROTEIN: 7.5 g/dL (ref 6.1–8.1)
Total Bilirubin: 0.4 mg/dL (ref 0.2–1.2)

## 2016-09-22 LAB — TSH: TSH: 1.16 mIU/L

## 2016-09-22 MED ORDER — AMLODIPINE BESYLATE 10 MG PO TABS: 10.0000 mg | ORAL_TABLET | Freq: Every day | ORAL | 1 refills | Status: DC

## 2016-09-22 NOTE — Progress Notes (Signed)
Pre visit review using our clinic review tool, if applicable. No additional management support is needed unless otherwise documented below in the visit note. 

## 2016-09-22 NOTE — Patient Instructions (Signed)
Please complete lab work prior to leaving. Work on [employer], exercise, weight loss. Please schedule a follow up with your dentist. Let me know if your dentist thinks that you should come off of amlodipine. You will be contacted about scheduling your mammogram and your bone density.

## 2016-09-22 NOTE — Progress Notes (Signed)
Subjective:    Patient ID: Tiffany [name], female    DOB: 1966-03-24, 51 y.o.   MRN: [id number]  HPI  Patient presents today for complete physical.  Immunizations: tetanus 1/12- up to date. Diet:diet needs improvement.  Eats too much pasta bread Wt Readings from Last 3 Encounters:  09/22/16 169 lb 9.6 oz (76.9 kg)  08/07/16 172 lb (78 kg)  11/27/15 160 lb (72.6 kg)  Exercise: walks a lot Pap Smear:  08/08/15 Mammogram: 12/16    Review of Systems  Constitutional: Negative for unexpected weight change.  HENT: Negative for hearing loss and rhinorrhea.   Eyes: Negative for visual disturbance.  Respiratory: Negative for cough.   Cardiovascular: Negative for leg swelling.  Gastrointestinal: Negative for blood in stool, constipation and diarrhea.  Genitourinary: Negative for dysuria and frequency.       Menopausal x 10 years  Musculoskeletal: Negative for arthralgias and myalgias.  Neurological: Negative for headaches.  Hematological: Negative for adenopathy.  Psychiatric/Behavioral:       Denies depression/anxiety   Past Medical History:  Diagnosis Date  . Acute pharyngitis 10/28/2012  . Hypertension   . Microscopic hematuria   . Non Hodgkin's lymphoma ([place]) 2/08   stage III  . Obesity   . Seizure disorder [facility])      Social History   Social History  . Marital status: Married    Spouse name: Tiffany [name]  . Number of children: 2  . 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 Smoking status: Never Smoker  . Smokeless tobacco: Never Used  . Alcohol use No  . Drug use: No  . Sexual activity: Yes    Partners: Male   Other Topics Concern  . Not on file   Social History Narrative  . No narrative on file    Past Surgical History:  Procedure Laterality Date  . [place], 2002  . PORT-A-CATH REMOVAL  2009  . PORTACATH PLACEMENT  2008    Family History  Problem  Relation Age of Onset  . Breast cancer Mother   . Hypertension Mother   . Hypertension Father   . Hypertension Maternal Grandmother   . Hypertension Paternal Grandmother     No Known Allergies  Current Outpatient Prescriptions on File Prior to Visit  Medication Sig Dispense Refill  . amLODipine (NORVASC) 10 MG tablet Take 1 tablet (10 mg total) by mouth daily. 90 tablet 1  . lamoTRIgine (LAMICTAL) 100 MG tablet Take 1 tablet (100 mg total) by mouth 2 (two) times daily. 180 tablet 3  . Multiple Vitamins-Minerals (MULTIVITAMIN WITH MINERALS) tablet Take 1 tablet by mouth daily.     No current facility-administered medications on file prior to visit.     BP 127/80 (BP Location: Left Arm, Cuff Size: Large)   Pulse (!) 102   Temp 98.2 F (36.8 C) (Oral)   Resp 16   Ht 5' 0.5" (1.537 m)   Wt 169 lb 9.6 oz (76.9 kg)   SpO2 100% Comment: room air  BMI 32.58 kg/m       Objective:   Physical Exam  Physical Exam  Constitutional: She is oriented to person, place, and time. She appears well-developed and well-nourished. No distress.  HENT: Gums appear mildly swollen Head: Normocephalic and atraumatic.  Right Ear: Tympanic membrane and ear canal normal.  Left Ear: Tympanic membrane and ear canal normal.  Mouth/Throat: Oropharynx is clear  and moist.  Eyes: Pupils are equal, round, and reactive to light. No scleral icterus.  Neck: Normal range of motion. No thyromegaly present.  Cardiovascular: Normal rate and regular rhythm.   No murmur heard. Pulmonary/Chest: Effort normal and breath sounds normal. No respiratory distress. He has no wheezes. She has no rales. She exhibits no tenderness.  Abdominal: Soft. Bowel sounds are normal. She exhibits no distension and no mass. There is no tenderness. There is no rebound and no guarding.  Musculoskeletal: She exhibits no edema.  Lymphadenopathy:    She has no cervical adenopathy.  Neurological: She is alert and oriented to person, place,  and time. She has normal patellar reflexes. She exhibits normal muscle tone. Coordination normal.  Skin: Skin is warm and dry.  Psychiatric: She has a normal mood and affect. Her behavior is normal. Judgment and thought content normal.  Breast/pelvic: deferred          Assessment & Plan:        Assessment & Plan:  Preventative care- immunizations reviewed and up to date. Discussed healthy diet, exercise weight loss.  Obtain routine lab work.  EKG tracing is personally reviewed.  EKG notes NSR.  No acute changes. I did advise her to follow up with her dentist re: her gum swelling. I am not sure if this is due to gingivitis or if it may be secondary to her amlodipine. Refer for dexa since she has been in menopause for 10+ years and also refer for mammogram.

## 2016-09-23 ENCOUNTER — Telehealth: Payer: Self-pay | Admitting: Family

## 2016-09-23 DIAGNOSIS — R3129 Other microscopic hematuria: Secondary | ICD-10-CM

## 2016-09-23 LAB — URINALYSIS, ROUTINE W REFLEX MICROSCOPIC
Bilirubin Urine: NEGATIVE
GLUCOSE, UA: NEGATIVE
Ketones, ur: NEGATIVE
LEUKOCYTES UA: NEGATIVE
Nitrite: NEGATIVE
PH: 5.5 (ref 5.0–8.0)
Protein, ur: NEGATIVE
Specific Gravity, Urine: 1.02 (ref 1.001–1.035)

## 2016-09-23 LAB — URINALYSIS, MICROSCOPIC ONLY
Bacteria, UA: NONE SEEN [HPF]
CRYSTALS: NONE SEEN [HPF]
Casts: NONE SEEN [LPF]
Squamous Epithelial / LPF: NONE SEEN [HPF] (ref <=5)
YEAST: NONE SEEN [HPF]

## 2016-09-23 NOTE — Telephone Encounter (Signed)
Calcium mildly elevated. Recommend repeat calcium and PTH in 2 weeks. Is she taking calcium supplement she should d/c. + microscopic blood in urine. Repeat ua with micro in 2 weeks.   Other lab work looks good.

## 2016-09-24 NOTE — Telephone Encounter (Signed)
Left message for pt to return my call.

## 2016-09-24 NOTE — Telephone Encounter (Signed)
Pt returned my call and was notified of below. Reports that she is not taking calcium, just regular multivitamin. Pt reports that she always has blood in her urine. States she was referred to urology and had additional testing while she was a pt of dr Coralyn Mark and workup was negative. Pt agreeable to proceed with additional blood tests but declines repeat urine at this time.  She requested order be faxed to [phone].  Order faxed. Please advise if further recommendations?

## 2016-09-25 ENCOUNTER — Encounter: Payer: Self-pay | Admitting: Family

## 2016-09-25 DIAGNOSIS — R3129 Other microscopic hematuria: Secondary | ICD-10-CM

## 2016-09-25 HISTORY — DX: Other microscopic hematuria: R31.29

## 2016-09-25 NOTE — Telephone Encounter (Signed)
Noted  

## 2017-01-19 ENCOUNTER — Telehealth: Payer: Self-pay | Admitting: Family

## 2017-01-19 NOTE — Telephone Encounter (Signed)
I would recommend OV first please.

## 2017-01-19 NOTE — Telephone Encounter (Signed)
Caller name: Aidee Foster-Abbitt Relationship to patient: self Can be reached: [phone]  Reason for call: Pt is having swelling in her right breast. It is a little uncomfortable. She called [facility] and was advised Melissa needs to order Diagnotic Mammogram and [place] right breast.

## 2017-01-19 NOTE — Telephone Encounter (Signed)
Spoke with pt. She requests appt before 8:30am or after 5:30pm of a day. First available within those times is Wednesday with Dr Nani Ravens at [facility]. Pt is agreeable and appt has been scheduled.

## 2017-01-21 ENCOUNTER — Encounter: Payer: Self-pay | Admitting: Family Medicine

## 2017-01-21 ENCOUNTER — Ambulatory Visit (INDEPENDENT_AMBULATORY_CARE_PROVIDER_SITE_OTHER): Payer: 59 | Admitting: Family Medicine

## 2017-01-21 VITALS — BP 120/80 | HR 94 | Temp 98.4°F | Ht 60.5 in | Wt 172.6 lb

## 2017-01-21 DIAGNOSIS — N644 Mastodynia: Secondary | ICD-10-CM | POA: Diagnosis not present

## 2017-01-21 NOTE — Progress Notes (Signed)
Chief Complaint  Patient presents with  . Breast Problem    (R) swelling notice on Sun-and discomfort on Mon-pt has hx of CA    Subjective: Patient is a 51 y.o. female here for R lateral breast pain.  Swelling started on Sun, some discomfort on Mon. She does do self exams and does not notice any significant changes, including new lumps/bumps. She no longer has periods. She has a hx of lymphoma so is concerned with possibility of breast cancer. No weight loss, fevers, N/V, nipple discharge, skin changes, or hx of abn mammogram.   ROS: Skin: As noted in HPI Const: no unintentional wt loss  Family History  Problem Relation Age of Onset  . Breast cancer Mother   . Hypertension Mother   . Hypertension Father   . Hypertension Maternal Grandmother   . Hypertension Paternal Grandmother    Past Medical History:  Diagnosis Date  . Hypertension   . Microscopic hematuria 09/25/2016   Had complete work up with Dr. Wendy Poet.   Cysto unrevealing,  CT scan nl   . Non Hodgkin's lymphoma ([place]) 2/08   stage III  . Obesity   . Seizure disorder ([place])    No Known Allergies  Current Outpatient Prescriptions:  .  amLODipine (NORVASC) 10 MG tablet, Take 1 tablet (10 mg total) by mouth daily., Disp: 90 tablet, Rfl: 1 .  lamoTRIgine (LAMICTAL) 100 MG tablet, Take 1 tablet (100 mg total) by mouth 2 (two) times daily., Disp: 180 tablet, Rfl: 3 .  Multiple Vitamins-Minerals (MULTIVITAMIN WITH MINERALS) tablet, Take 1 tablet by mouth daily., Disp: , Rfl:   Objective: BP 120/80 (BP Location: Left Arm, Patient Position: Sitting, Cuff Size: Normal)   Pulse 94   Temp 98.4 F (36.9 C) (Oral)   Ht 5' 0.5" (1.537 m)   Wt 172 lb 9.6 oz (78.3 kg)   SpO2 98%   BMI 33.15 kg/m  General: Awake, appears stated age Lungs: No accessory muscle use Breast: No stippling or erythema of skin, gross appearance is normal. There is no fluctuance or TTP on my exam. Other that fibrous tissue, I did not palpate any  nodules or appreciate any lesions. No discharge. No axillary nodules or lymphadenopathy. Psych: Age appropriate judgment and insight, normal affect and mood  Assessment and Plan: Breast pain - Plan: US BREAST COMPLETE UNI RIGHT INC AXILLA, MM Digital Diagnostic Bilat  Orders as above. The patient voiced understanding and agreement to the plan.  [place], DO 01/21/17  7:27 AM

## 2017-01-21 NOTE — Patient Instructions (Signed)
I did not feel any lumps or abnormalities today.   If you do not hear anything about your studies in the next week, call our office and ask for an update.  Let us know if you need anything.

## 2017-02-04 ENCOUNTER — Ambulatory Visit
Admission: RE | Admit: 2017-02-04 | Discharge: 2017-02-04 | Disposition: A | Discharge status: Home / Self Care | Payer: 59 | Source: Ambulatory Visit | Attending: Family | Admitting: Family

## 2017-02-04 ENCOUNTER — Ambulatory Visit: Payer: 59

## 2017-02-04 ENCOUNTER — Ambulatory Visit
Admission: RE | Admit: 2017-02-04 | Discharge: 2017-02-04 | Disposition: A | Discharge status: Home / Self Care | Payer: 59 | Source: Ambulatory Visit | Attending: Family Medicine | Admitting: Family Medicine

## 2017-02-04 DIAGNOSIS — E2839 Other primary ovarian failure: Secondary | ICD-10-CM

## 2017-02-04 DIAGNOSIS — N644 Mastodynia: Secondary | ICD-10-CM

## 2017-03-28 ENCOUNTER — Other Ambulatory Visit: Payer: Self-pay | Admitting: Family

## 2017-05-17 ENCOUNTER — Other Ambulatory Visit: Payer: Self-pay | Admitting: Family

## 2017-06-19 ENCOUNTER — Telehealth: Payer: Self-pay | Admitting: Family

## 2017-06-19 MED ORDER — AMLODIPINE BESYLATE 10 MG PO TABS: 10.0000 mg | ORAL_TABLET | Freq: Every day | ORAL | 0 refills | Status: DC

## 2017-06-19 NOTE — Telephone Encounter (Signed)
Please notify pt that a 30 day supply was sent to the pharmacy but she will need to be seen for further refills. Please schedule f/u with Melissa. Thanks!

## 2017-06-19 NOTE — Telephone Encounter (Signed)
Copied from [place] [id number]. Topic: Quick Communication - Rx Refill/Question >> Jun 19, 2017  1:25 PM Synthia Innocent wrote: Medication: amLODipine (NORVASC) 10 MG tablet    Has the patient contacted their pharmacy? No.   (Agent: If no, request that the patient contact the pharmacy for the refill.) Patient would like to know if she need a follow up visit first or can this be refilled   Preferred Pharmacy (with phone number or street name): [facility] [address]   Agent: Please be advised that RX refills may take up to 3 business days. We ask that you follow-up with your pharmacy.

## 2017-06-26 ENCOUNTER — Other Ambulatory Visit: Payer: Self-pay | Admitting: Family

## 2017-07-17 ENCOUNTER — Encounter: Payer: Self-pay | Admitting: Family

## 2017-07-17 ENCOUNTER — Ambulatory Visit (INDEPENDENT_AMBULATORY_CARE_PROVIDER_SITE_OTHER): Payer: 59 | Admitting: Family

## 2017-07-17 VITALS — BP 137/85 | HR 104 | Temp 98.3°F | Resp 16 | Ht 60.5 in | Wt 172.4 lb

## 2017-07-17 DIAGNOSIS — I10 Essential (primary) hypertension: Secondary | ICD-10-CM | POA: Diagnosis not present

## 2017-07-17 DIAGNOSIS — G40909 Epilepsy, unspecified, not intractable, without status epilepticus: Secondary | ICD-10-CM

## 2017-07-17 MED ORDER — AMLODIPINE BESYLATE 10 MG PO TABS: 10.0000 mg | ORAL_TABLET | Freq: Every day | ORAL | 1 refills | Status: DC

## 2017-07-17 NOTE — Patient Instructions (Signed)
Please complete lab work prior to leaving.   

## 2017-07-17 NOTE — Progress Notes (Signed)
Subjective:    Patient ID: Tiffany [name], female    DOB: 09-08-1965, 52 y.o.   MRN: [id number]  HPI  Pt is a 51 yr old female who presents today for follow up. Reports that her blood pressure is stable.  She is maintained on amlodipine. BP Readings from Last 3 Encounters:  07/17/17 137/85  01/21/17 120/80  09/22/16 127/80    Seizure disorder-reports no recent seizure activity.  Her medications are managed by neurology. Review of Systems    see HPI  Past Medical History:  Diagnosis Date  . Hypertension   . Microscopic hematuria 09/25/2016   Had complete work up with Dr. Wendy Poet.   Cysto unrevealing,  CT scan nl   . Non Hodgkin's lymphoma ([place]) 2/08   stage III  . Obesity   . Seizure disorder [facility])      Social History   Socioeconomic History  . Marital status: Married    Spouse name: Iona Beard  . Number of children: 2  . Years of education: college  . Highest education level: Not on file  Social Needs  . Financial resource strain: Not on file  . Food insecurity - worry: Not on file  . Food insecurity - inability: Not on file  . Transportation needs - medical: Not on file  . Transportation needs - non-medical: Not on file  Occupational History  . Occupation: Engineer, building services: [employer]  Tobacco Use  . Smoking status: Never Smoker  . Smokeless tobacco: Never Used  Substance and Sexual Activity  . Alcohol use: No  . Drug use: No  . Sexual activity: Yes    Partners: Male  Other Topics Concern  . Not on file  Social History Narrative   2 children   Married   Works as a Char[employer] fundraiser   enjoys shopping, movies    Past Surgical History:  Procedure Laterality Date  . [place], 2002  . PORT-A-CATH REMOVAL  2009  . PORTACATH PLACEMENT  2008    Family History  Problem Relation Age of Onset  . Breast cancer Mother   . Hypertension Mother   . Hypertension Father   . Hypertension Maternal Grandmother   . Hypertension  Paternal Grandmother     No Known Allergies  Current Outpatient Medications on File Prior to Visit  Medication Sig Dispense Refill  . amLODipine (NORVASC) 10 MG tablet Take 1 tablet (10 mg total) by mouth daily. 30 tablet 0  . lamoTRIgine (LAMICTAL) 100 MG tablet Take 1 tablet (100 mg total) by mouth 2 (two) times daily. 180 tablet 3  . Multiple Vitamins-Minerals (MULTIVITAMIN WITH MINERALS) tablet Take 1 tablet by mouth daily.     No current facility-administered medications on file prior to visit.     BP 137/85 (BP Location: Left Arm, Patient Position: Sitting, Cuff Size: Large)   Pulse (!) 104   Temp 98.3 F (36.8 C) (Oral)   Resp 16   Ht 5' 0.5" (1.537 m)   Wt 172 lb 6.4 oz (78.2 kg)   SpO2 100%   BMI 33.12 kg/m   Objective:   Physical Exam  Constitutional: She appears well-developed and well-nourished.  Cardiovascular: Normal rate, regular rhythm and normal heart sounds.  No murmur heard. Pulmonary/Chest: Effort normal and breath sounds normal. No respiratory distress. She has no wheezes.  Psychiatric: She has a normal mood and affect. Her behavior is normal. Judgment and thought content normal.  Assessment & Plan:  Hypertension-blood pressure is stable.  Continue current medication.  Will obtain follow-up basic metabolic panel.  Seizure disorder-stable management per neurology.

## 2017-08-10 ENCOUNTER — Ambulatory Visit: Payer: 59 | Admitting: Nurse Practitioner

## 2017-08-17 NOTE — Progress Notes (Signed)
[facility]  PATIENT: Tiffany [name] DOB: [birth year]   REASON FOR VISIT: follow-up for generalized epilepsy HISTORY FROM:patient    HISTORY OF PRESENT ILLNESS:UPDATE 08/18/17 CM [name] , 52 year old female returns for yearly follow-up.  She was last seen 08/07/2016.  She has a history of generalized seizure disorder. Her last seizure was approximately 6 years ago. She is currently on Lamictal tolerating the medication without difficulty. EEG in the past has been normal as well as MRI of the brain. She has not had any interval medical problems. She is overweight. She claims she loves breads, pasta and sweets.  She was encouraged to try to lose weight to prevent long-term chronic medical issues.  She returns for reevaluation and refills  REVIEW OF SYSTEMS: Full 14 system review of systems performed and notable only for those listed, all others are neg:  Constitutional: neg  Cardiovascular: neg Ear/Nose/Throat: neg  Skin: neg Eyes: neg Respiratory: neg Gastroitestinal: neg  Hematology/Lymphatic: neg  Endocrine: neg Musculoskeletal:neg Allergy/Immunology: neg Neurological: neg Psychiatric: neg Sleep : neg   ALLERGIES: No Known Allergies  HOME MEDICATIONS: Outpatient Medications Prior to Visit  Medication Sig Dispense Refill  . amLODipine (NORVASC) 10 MG tablet Take 1 tablet (10 mg total) by mouth daily. 90 tablet 1  . lamoTRIgine (LAMICTAL) 100 MG tablet Take 1 tablet (100 mg total) by mouth 2 (two) times daily. 180 tablet 3  . Multiple Vitamins-Minerals (MULTIVITAMIN WITH MINERALS) tablet Take 1 tablet by mouth daily.     No facility-administered medications prior to visit.     PAST MEDICAL HISTORY: Past Medical History:  Diagnosis Date  . Hypertension   . Microscopic hematuria 09/25/2016   Had complete work up with Dr. Wendy Poet.   Cysto unrevealing,  CT scan nl   . Non Hodgkin's lymphoma ([place]) 2/08   stage III  . Obesity   .  Seizure disorder ([place])     PAST SURGICAL HISTORY: Past Surgical History:  Procedure Laterality Date  . [place], 2002  . PORT-A-CATH REMOVAL  2009  . PORTACATH PLACEMENT  2008    FAMILY HISTORY: Family History  Problem Relation Age of Onset  . Breast cancer Mother   . Hypertension Mother   . Hypertension Father   . Hypertension Maternal Grandmother   . Hypertension Paternal Grandmother     SOCIAL HISTORY: Social History   Socioeconomic History  . Marital status: Married    Spouse name: Iona Beard  . Number of children: 2  . Years of education: college  . Highest education level: Not on file  Social Needs  . Financial resource strain: Not on file  . Food insecurity - worry: Not on file  . Food insecurity - inability: Not on file  . Transportation needs - medical: Not on file  . Transportation needs - non-medical: Not on file  Occupational History  . Occupation: Engineer, building services: [employer]  Tobacco Use  . Smoking status: Never Smoker  . Smokeless tobacco: Never Used  Substance and Sexual Activity  . Alcohol use: No  . Drug use: No  . Sexual activity: Yes    Partners: Male  Other Topics Concern  . Not on file  Social History Narrative   2 children   Married   Works as a Charity fundraiser   enjoys shopping, movies     PHYSICAL EXAM  Vitals:   08/18/17 0755  BP: 140/80  Pulse: (!) 101  Weight: 172 lb 9.6 oz (78.3  kg)  Height: 4\' 11"  (1.499 m)   Body mass index is 34.86 kg/m. Generalized: Well developed, Obese female in no acute distress  Head: normocephalic and atraumatic,. Oropharynx benign  Musculoskeletal: No deformity   Neurological examination   Mentation: Alert oriented to time, place, history taking. Attention span and concentration appropriate. Recent and remote memory intact. Follows all commands speech and language fluent.   Cranial nerve II-XII: Pupils were equal round reactive to light extraocular movements  were full, visual field were full on confrontational test. Facial sensation and strength were normal. hearing was intact to finger rubbing bilaterally. Uvula tongue midline. head turning and shoulder shrug were normal and symmetric.Tongue protrusion into cheek strength was normal. Motor: normal bulk and tone, full strength in the BUE, BLE, fine finger movements normal, . Sensory: normal and symmetric to light touch, pinprick, and Vibration in the upper and lower extremities,  Coordination: finger-nose-finger, heel-to-shin bilaterally, no dysmetria, no tremor Reflexes: Symmetric upper and lower plantar responses were flexor bilaterally. Gait and Station: Rising up from seated position without assistance, normal stance, moderate stride, good arm swing, smooth turning, able to perform tiptoe, and heel walking without difficulty. Tandem gait is steady  DIAGNOSTIC DATA (LABS, IMAGING, TESTING)   ASSESSMENT AND PLAN  52 y.o. year old female  has a past medical history of Non Hodgkin's lymphoma ([place]) (2/08); Obesity; Seizure disorder ([place]); Hypertension; . here to follow-up. Last seizure was 6 years ago.  She is currently on Lamictal tolerating without side effects.  Continue Lamictal at current dose will refill for 3 months with 3 refills Follow-up yearly and when necessary Call for any seizure activity Try to limit carbs  and get regular exercise for weight loss Reviewed common seizure triggers Dennie Bible, [facility], [facility], APRN  [facility] [address], [place], [place] [address] [phone]

## 2017-08-18 ENCOUNTER — Ambulatory Visit (INDEPENDENT_AMBULATORY_CARE_PROVIDER_SITE_OTHER): Payer: 59 | Admitting: Nurse Practitioner

## 2017-08-18 ENCOUNTER — Encounter: Payer: Self-pay | Admitting: Nurse Practitioner

## 2017-08-18 VITALS — BP 140/80 | HR 101 | Ht 59.0 in | Wt 172.6 lb

## 2017-08-18 DIAGNOSIS — G40309 Generalized idiopathic epilepsy and epileptic syndromes, not intractable, without status epilepticus: Secondary | ICD-10-CM

## 2017-08-18 MED ORDER — LAMOTRIGINE 100 MG PO TABS: 100.0000 mg | ORAL_TABLET | Freq: Two times a day (BID) | ORAL | 3 refills | Status: DC

## 2017-08-18 NOTE — Progress Notes (Signed)
I agree with the above plan 

## 2017-08-18 NOTE — Patient Instructions (Signed)
Continue Lamictal at current dose will refill for 3 months with 3 refills Follow-up yearly and when necessary Call for any seizure activity Try to limit carbs  and get regular exercise for weight loss Reviewed seizure triggers

## 2018-01-22 ENCOUNTER — Other Ambulatory Visit: Payer: Self-pay | Admitting: Family

## 2018-01-29 ENCOUNTER — Other Ambulatory Visit: Payer: Self-pay | Admitting: Family

## 2018-01-29 DIAGNOSIS — Z1231 Encounter for screening mammogram for malignant neoplasm of breast: Secondary | ICD-10-CM

## 2018-02-03 ENCOUNTER — Encounter: Payer: Self-pay | Admitting: Family Medicine

## 2018-02-03 ENCOUNTER — Ambulatory Visit (INDEPENDENT_AMBULATORY_CARE_PROVIDER_SITE_OTHER): Payer: 59 | Admitting: Family Medicine

## 2018-02-03 VITALS — BP 118/76 | HR 102 | Temp 98.6°F | Ht 59.0 in | Wt 173.2 lb

## 2018-02-03 DIAGNOSIS — Z1211 Encounter for screening for malignant neoplasm of colon: Secondary | ICD-10-CM

## 2018-02-03 DIAGNOSIS — I10 Essential (primary) hypertension: Secondary | ICD-10-CM | POA: Diagnosis not present

## 2018-02-03 MED ORDER — AMLODIPINE BESYLATE 10 MG PO TABS: 10.0000 mg | ORAL_TABLET | Freq: Every day | ORAL | 3 refills | Status: DC

## 2018-02-03 NOTE — Patient Instructions (Addendum)
Get [place] copies of your labs please.  If you do not hear anything about your referral in the next 1-2 weeks, call our office and ask for an update.  Stay active, consider lifting weights. Yoga. Keep the diet clean.   Let us know if you need anything.

## 2018-02-03 NOTE — Progress Notes (Signed)
Chief Complaint  Patient presents with  . Hypertension    Subjective Tiffany [name] is a 52 y.o. female who presents for hypertension follow up. She does monitor home blood pressures. Blood pressures ranging from 110's/70's on average. She is compliant with medication- Norvasc 10 mg/d. Patient has these side effects of medication: none She is sometimes adhering to a healthy diet overall. Current exercise: walking  Has not had ccs yet. Also has had labs following up on hypercalcemia. Reportedly had neg results in 03/2017, but will need to check on them.    Past Medical History:  Diagnosis Date  . Hypertension   . Microscopic hematuria 09/25/2016   Had complete work up with Dr. Wendy Poet.   Cysto unrevealing,  CT scan nl   . Non Hodgkin's lymphoma ([place]) 2/08   stage III  . Obesity   . Seizure disorder ([place])     Review of Systems Cardiovascular: no chest pain Respiratory:  no shortness of breath  Exam BP 118/76 (BP Location: Left Arm, Patient Position: Sitting, Cuff Size: Normal)   Pulse (!) 102   Temp 98.6 F (37 C) (Oral)   Ht 4\' 11"  (1.499 m)   Wt 173 lb 4 oz (78.6 kg)   SpO2 97%   BMI 34.99 kg/m  General:  well developed, well nourished, in no apparent distress Heart: RRR during my exam, HR around 84, no bruits, no LE edema Lungs: clear to auscultation, no accessory muscle use Psych: well oriented with normal range of affect and appropriate judgment/insight  Essential hypertension - Plan: amLODipine (NORVASC) 10 MG tablet  Screen for colon cancer - Plan: Ambulatory referral to Gastroenterology  Orders as above. Counseled on diet and exercise. Cont Novrasc 10 mg/d. Refer to GI for CCS. Would like her to get [place] labs, will likely have corrected calcium on there. F/u for cpe with reg pcp at earliest convenience. The patient voiced understanding and agreement to the plan.  [place], DO 02/03/18  10:49 AM

## 2018-02-03 NOTE — Progress Notes (Signed)
Pre visit review using our clinic review tool, if applicable. No additional management support is needed unless otherwise documented below in the visit note. 

## 2018-02-05 ENCOUNTER — Ambulatory Visit: Payer: 59

## 2018-02-25 ENCOUNTER — Emergency Department (HOSPITAL_BASED_OUTPATIENT_CLINIC_OR_DEPARTMENT_OTHER)
Admission: EM | Admit: 2018-02-25 | Discharge: 2018-02-25 | Disposition: A | Discharge status: Home / Self Care | Payer: 59 | Attending: Emergency Medicine | Admitting: Emergency Medicine

## 2018-02-25 ENCOUNTER — Encounter (HOSPITAL_BASED_OUTPATIENT_CLINIC_OR_DEPARTMENT_OTHER): Payer: Self-pay | Admitting: Emergency Medicine

## 2018-02-25 ENCOUNTER — Other Ambulatory Visit: Payer: Self-pay

## 2018-02-25 DIAGNOSIS — C8590 Non-Hodgkin lymphoma, unspecified, unspecified site: Secondary | ICD-10-CM | POA: Insufficient documentation

## 2018-02-25 DIAGNOSIS — I10 Essential (primary) hypertension: Secondary | ICD-10-CM | POA: Insufficient documentation

## 2018-02-25 DIAGNOSIS — R040 Epistaxis: Secondary | ICD-10-CM | POA: Diagnosis present

## 2018-02-25 DIAGNOSIS — Z79899 Other long term (current) drug therapy: Secondary | ICD-10-CM | POA: Diagnosis not present

## 2018-02-25 LAB — CBC WITH DIFFERENTIAL/PLATELET
BASOS PCT: 0 %
Basophils Absolute: 0 10*3/uL (ref 0.0–0.1)
Eosinophils Absolute: 0.2 10*3/uL (ref 0.0–0.7)
Eosinophils Relative: 3 %
HEMATOCRIT: 38.9 % (ref 36.0–46.0)
HEMOGLOBIN: 12.7 g/dL (ref 12.0–15.0)
LYMPHS PCT: 29 %
Lymphs Abs: 2.1 10*3/uL (ref 0.7–4.0)
MCH: 25.9 pg — ABNORMAL LOW (ref 26.0–34.0)
MCHC: 32.6 g/dL (ref 30.0–36.0)
MCV: 79.4 fL (ref 78.0–100.0)
MONO ABS: 0.5 10*3/uL (ref 0.1–1.0)
Monocytes Relative: 8 %
NEUTROS ABS: 4.2 10*3/uL (ref 1.7–7.7)
NEUTROS PCT: 60 %
Platelets: 387 10*3/uL (ref 150–400)
RBC: 4.9 MIL/uL (ref 3.87–5.11)
RDW: 14.4 % (ref 11.5–15.5)
WBC: 7.1 10*3/uL (ref 4.0–10.5)

## 2018-02-25 MED ORDER — OXYMETAZOLINE HCL 0.05 % NA SOLN
1.0000 | Freq: Once | NASAL | Status: AC
  Administered 2018-02-25: 1 via NASAL

## 2018-02-25 MED ORDER — OXYMETAZOLINE HCL 0.05 % NA SOLN
NASAL | Status: AC
  Administered 2018-02-25: 02:00:00
  Filled 2018-02-25: qty 15

## 2018-02-25 NOTE — ED Provider Notes (Signed)
[facility] Provider Note: Georgena Spurling, MD, FACEP  CSN: [id number] MRN: [id number] ARRIVAL: 02/25/18 at [place]: [id number]   CHIEF COMPLAINT  Epistaxis   HISTORY OF PRESENT ILLNESS  02/25/18 3:22 AM Tiffany [name] is a 52 y.o. female who has had several episodes of right-sided epistaxis over the past 48 hours.  She denies trauma to her nose.  Her blood pressure has been well controlled, typically 130/85-90.  She describes the epistaxis as sudden onset and profuse.  She has been able to get it to stop with pressure.  She had some residual bleeding on arrival to the ED and this was abated with topical Afrin.  She denies pain or lightheadedness.   Past Medical History:  Diagnosis Date  . Hypertension   . Microscopic hematuria 09/25/2016   Had complete work up with Dr. Wendy Poet.   Cysto unrevealing,  CT scan nl   . Non Hodgkin's lymphoma ([place]) 2/08   stage III  . Obesity   . Seizure disorder [facility])     Past Surgical History:  Procedure Laterality Date  . [place], 2002  . PORT-A-CATH REMOVAL  2009  . PORTACATH PLACEMENT  2008    Family History  Problem Relation Age of Onset  . Breast cancer Mother   . Hypertension Mother   . Hypertension Father   . Hypertension Maternal Grandmother   . Hypertension Paternal Grandmother     Social History   Tobacco Use  . Smoking status: Never Smoker  . Smokeless tobacco: Never Used  Substance Use Topics  . Alcohol use: No  . Drug use: No    Prior to Admission medications   Medication Sig Start Date End Date Taking? Authorizing Provider  amLODipine (NORVASC) 10 MG tablet Take 1 tablet (10 mg total) by mouth daily. 02/03/18   Shelda Pal, DO  lamoTRIgine (LAMICTAL) 100 MG tablet Take 1 tablet (100 mg total) by mouth 2 (two) times daily. 08/18/17   Dennie Bible, NP  Multiple Vitamins-Minerals (MULTIVITAMIN WITH MINERALS) tablet Take 1 tablet by mouth daily.    [provider]    Allergies Patient has no known allergies.   REVIEW OF SYSTEMS  Negative except as noted here or in the History of Present Illness.   PHYSICAL EXAMINATION  Initial Vital Signs Blood pressure 140/90, pulse 95, temperature 98.1 F (36.7 C), temperature source Oral, resp. rate 16, height 4\' 11"  (1.499 m), weight 77.1 kg, SpO2 100 %.  Examination General: Well-developed, well-nourished female in no acute distress; appearance consistent with age of record HENT: normocephalic; atraumatic; no bleeding or dried blood in nares; pharynx normal Eyes: pupils equal, round and reactive to light; extraocular muscles intact Neck: supple Heart: regular rate and rhythm Lungs: clear to auscultation bilaterally Abdomen: soft; nondistended; nontender; bowel sounds present Extremities: No deformity; full range of motion Neurologic: Awake, alert and oriented; motor function intact in all extremities and symmetric; no facial droop Skin: Warm and dry Psychiatric: Normal mood and affect   RESULTS  Summary of this visit's results, reviewed by myself:   EKG Interpretation  Date/Time:    Ventricular Rate:    PR Interval:    QRS Duration:   QT Interval:    QTC Calculation:   R Axis:     Text Interpretation:        Laboratory Studies: Results for orders placed or performed during the hospital encounter of 02/25/18 (from the past 24 hour(s))  CBC with Differential/Platelet  Status: Abnormal   Collection Time: 02/25/18  3:35 AM  Result Value Ref Range   WBC 7.1 4.0 - 10.5 K/uL   RBC 4.90 3.87 - 5.11 MIL/uL   Hemoglobin 12.7 12.0 - 15.0 g/dL   HCT 38.9 36.0 - 46.0 %   MCV 79.4 78.0 - 100.0 fL   MCH 25.9 (L) 26.0 - 34.0 pg   MCHC 32.6 30.0 - 36.0 g/dL   RDW 14.4 11.5 - 15.5 %   Platelets 387 150 - 400 K/uL   Neutrophils Relative % 60 %   Neutro Abs 4.2 1.7 - 7.7 K/uL   Lymphocytes Relative 29 %   Lymphs Abs 2.1 0.7 - 4.0 K/uL   Monocytes Relative 8 %   Monocytes  Absolute 0.5 0.1 - 1.0 K/uL   Eosinophils Relative 3 %   Eosinophils Absolute 0.2 0.0 - 0.7 K/uL   Basophils Relative 0 %   Basophils Absolute 0.0 0.0 - 0.1 K/uL   Imaging Studies: No results found.  ED COURSE and MDM  Nursing notes and initial vitals signs, including pulse oximetry, reviewed.  Vitals:   02/25/18 0131 02/25/18 0132 02/25/18 0133  BP:  140/90   Pulse:  95   Resp:  16   Temp:  98.1 F (36.7 C)   TempSrc:  Oral   SpO2:  100%   Weight:   77.1 kg  Height: 4\' 11"  (1.499 m)     3:52 AM Nares remain hemostatic.  PROCEDURES    ED DIAGNOSES     ICD-10-CM   1. Right-sided epistaxis R04.0        Colbi Schiltz, Jenny Reichmann, MD 02/25/18 [phone]

## 2018-02-25 NOTE — ED Triage Notes (Signed)
Pt reports intermittent nose bleeding since tues morning.

## 2018-03-02 ENCOUNTER — Encounter: Payer: Self-pay | Admitting: Internal Medicine

## 2018-03-02 ENCOUNTER — Encounter: Payer: Self-pay | Admitting: Family

## 2018-03-02 ENCOUNTER — Ambulatory Visit (INDEPENDENT_AMBULATORY_CARE_PROVIDER_SITE_OTHER): Payer: 59 | Admitting: Internal Medicine

## 2018-03-02 VITALS — BP 128/64 | HR 98 | Temp 98.2°F | Resp 16 | Ht 59.0 in | Wt 172.1 lb

## 2018-03-02 DIAGNOSIS — R040 Epistaxis: Secondary | ICD-10-CM

## 2018-03-02 NOTE — Progress Notes (Signed)
Pre visit review using our clinic review tool, if applicable. No additional management support is needed unless otherwise documented below in the visit note. 

## 2018-03-02 NOTE — Progress Notes (Signed)
Subjective:    Patient ID: Tiffany [name], female    DOB: July 13, 1965, 52 y.o.   MRN: [id number]  DOS:  03/02/2018 Type of visit - description : ED f/u  Interval history: Went to the ER after 1 week history of right-sided nosebleeds. That happened approximately 6 times. In the ER, BP was 140/90.  She was treated in the customary fashion for epistaxis   BP Readings from Last 3 Encounters:  03/02/18 128/64  02/25/18 128/81  02/03/18 118/76     Review of Systems Denies any recent URI or allergies. No substance abuse. Denies any bleeding in other areas such as the gum, urine or blood.  No family history of bleeding disorder. When asked if she has other concerns she reports that from time to time she experience chest dyscomfort, the last time that happened was several days ago after she experienced some chest discomfort after eating pizza and lying down.  Symptoms are not associated with diaphoresis, nausea.  No radiation. Does not smoke and has no family history of heart trouble. Also from time to time she has "shortness of breath" when she is talking but no difficulty breathing with activities of daily living.  Symptoms are random.   Past Medical History:  Diagnosis Date  . Hypertension   . Microscopic hematuria 09/25/2016   Had complete work up with Dr. Wendy Poet.   Cysto unrevealing,  CT scan nl   . Non Hodgkin's lymphoma ([place]) 2/08   stage III  . Obesity   . Seizure disorder [facility])     Past Surgical History:  Procedure Laterality Date  . [place], 2002  . PORT-A-CATH REMOVAL  2009  . PORTACATH PLACEMENT  2008    Social History   Socioeconomic History  . Marital status: Married    Spouse name: Iona Beard  . Number of children: 2  . Years of education: college  . Highest education level: Not on file  Occupational History  . Occupation: Engineer, building services: [employer]  . Financial resource strain: Not on file  . Food  insecurity:    Worry: Not on file    Inability: Not on file  . Transportation needs:    Medical: Not on file    Non-medical: Not on file  Tobacco Use  . Smoking status: Never Smoker  . Smokeless tobacco: Never Used  Substance and Sexual Activity  . Alcohol use: No  . Drug use: No  . Sexual activity: Yes    Partners: Male  Lifestyle  . Physical activity:    Days per week: Not on file    Minutes per session: Not on file  . Stress: Not on file  Relationships  . Social connections:    Talks on phone: Not on file    Gets together: Not on file    Attends religious service: Not on file    Active member of club or organization: Not on file    Attends meetings of clubs or organizations: Not on file    Relationship status: Not on file  . Intimate partner violence:    Fear of current or ex partner: Not on file    Emotionally abused: Not on file    Physically abused: Not on file    Forced sexual activity: Not on file  Other Topics Concern  . Not on file  Social History Narrative   2 children   Married   Works as a Charity fundraiser  enjoys shopping, movies      Allergies as of 03/02/2018   No Known Allergies     Medication List        Accurate as of 03/02/18 11:59 PM. Always use your most recent med list.          amLODipine 10 MG tablet Commonly known as:  NORVASC Take 1 tablet (10 mg total) by mouth daily.   lamoTRIgine 100 MG tablet Commonly known as:  LAMICTAL Take 1 tablet (100 mg total) by mouth 2 (two) times daily.   multivitamin with minerals tablet Take 1 tablet by mouth daily.          Objective:   Physical Exam BP 128/64 (BP Location: Left Arm, Patient Position: Sitting, Cuff Size: Normal)   Pulse 98   Temp 98.2 F (36.8 C) (Oral)   Resp 16   Ht 4\' 11"  (1.499 m)   Wt 172 lb 2 oz (78.1 kg)   SpO2 98%   BMI 34.77 kg/m      General:   Well developed, NAD, see BMI.  HEENT:  Normocephalic . Face symmetric, atraumatic Throat: Symmetric, no  red Nose: Right nostril with evidence of recent bleeding at the medial wall.  Otherwise normal. Left nostril normal Lungs:  CTA B Normal respiratory effort, no intercostal retractions, no accessory muscle use. Heart: RRR,  no murmur.  No pretibial edema bilaterally  Skin: Not pale. Not jaundice Neurologic:  alert & oriented X3.  Speech normal, gait appropriate for age and unassisted Psych--  Cognition and judgment appear intact.  Cooperative with normal attention span and concentration.  Behavior appropriate. No anxious or depressed appearing.   Assessmen exam t & Plan:   52 year old lady with history of HTN, seizure disorder presents with:  Right-sided epistaxis : New onset, a week ago.  Recommend conservative treatment for now with Vaseline twice daily and self treatment at home, see avs .  She was recommended Afrin, okay to use only during acute episodes.  If symptoms continue she is advised to let me know for a ENT referral HTN: Good compliance with amlodipine, BPs at the office are usually normal, at home when check is sometimes in the 140/90.  Recommend a low-salt diet, continue monitoring BPs, follow-up with PCP. Other symptoms: Chest discomfort, "SOB when talking" but no actual DOE.  Very atypical sxs , not consistent.  Recommend observation for now.

## 2018-03-02 NOTE — Patient Instructions (Signed)
GO TO THE FRONT DESK Schedule your next appointment to see Mrs Tiffany [name] in 2 months   Check the  blood pressure 2 or 3 times a  week   Be sure your blood pressure is between 110/65 and  135/85. If it is consistently higher or lower, let me know  Vaseline twice a day to the nose, call if the nosebleeds continue    Nosebleed, Adult A nosebleed is when blood comes out of the nose. Nosebleeds are common. Usually, they are not a sign of a serious condition. Nosebleeds can happen if a small blood vessel in your nose starts to bleed or if the lining of your nose (mucous membrane) cracks. They are commonly caused by:  Allergies.  Colds.  Picking your nose.  Blowing your nose too hard.  An injury from sticking an object into your nose or getting hit in the nose.  Dry or cold air.  Less common causes of nosebleeds include:  Toxic fumes.  Something abnormal in the nose or in the air-filled spaces in the bones of the face (sinuses).  Growths in the nose, such as polyps.  Medicines or conditions that cause blood to clot slowly.  Certain illnesses or procedures that irritate or dry out the nasal passages.  Follow these instructions at home: When you have a nosebleed:  Sit down and tilt your head slightly forward.  Use a clean towel or tissue to pinch your nostrils under the bony part of your nose. After 10 minutes, let go of your nose and see if bleeding starts again. Do not release pressure before that time. If there is still bleeding, repeat the pinching and holding for 10 minutes until the bleeding stops.  Do not place tissues or gauze in the nose to stop bleeding.  Avoid lying down and avoid tilting your head backward. That may make blood collect in the throat and cause gagging or coughing.  Use a nasal spray decongestant to help with a nosebleed as told by your health care provider.  Do not use petroleum jelly or mineral oil in your nose. It can drip into your  lungs. After a nosebleed:  Avoid blowing your nose or sniffing for a number of hours.  Avoid straining, lifting, or bending at the waist for several days. You may resume other normal activities as you are able.  Use saline spray or a humidifier as told by your health care provider.  Aspirinand blood thinners make bleeding more likely. If you are prescribed these medicines and you suffer from nosebleeds: ? Ask your health care provider if you should stop taking the medicines or if you should adjust the dose. ? Do not stop taking medicines that your health care provider has recommended unless told by your health care provider.  If your nosebleed was caused by dry mucous membranes, use over-the-counter saline nasal spray or gel. This will keep the mucous membranes moist and allow them to heal. If you must use a lubricant: ? Choose one that is water-soluble. ? Use only as much as you need and use it only as often as needed. ? Do not lie down until several hours after you use it. Contact a health care provider if:  You have a fever.  You get nosebleeds often or more often than usual.  You bruise very easily.  You have a nosebleed from having something stuck in your nose.  You have bleeding in your mouth.  You vomit or cough up brown material.  You  have a nosebleed after you start a new medicine. Get help right away if:  You have a nosebleed after a fall or a head injury.  Your nosebleed does not go away after 20 minutes.  You feel dizzy or weak.  You have unusual bleeding from other parts of your body.  You have unusual bruising on other parts of your body.  You become sweaty.  You vomit blood. This information is not intended to replace advice given to you by your health care provider. Make sure you discuss any questions you have with your health care provider. Document Released: 03/05/2005 Document Revised: 01/24/2016 Document Reviewed: 12/11/2015 Elsevier Interactive  Patient Education  [employer].

## 2018-03-10 ENCOUNTER — Encounter: Payer: Self-pay | Admitting: Gastroenterology

## 2018-04-02 ENCOUNTER — Ambulatory Visit (AMBULATORY_SURGERY_CENTER): Payer: Self-pay | Admitting: *Deleted

## 2018-04-02 ENCOUNTER — Other Ambulatory Visit: Payer: Self-pay

## 2018-04-02 VITALS — Ht 59.0 in | Wt 172.0 lb

## 2018-04-02 DIAGNOSIS — Z1211 Encounter for screening for malignant neoplasm of colon: Secondary | ICD-10-CM

## 2018-04-02 NOTE — Progress Notes (Signed)
No egg or soy allergy known to patient  No issues with past sedation with any surgeries  or procedures, no intubation problems  No diet pills per patient No home 02 use per patient  No blood thinners per patient  Pt denies issues with constipation  No A fib or A flutter  No seizure since 2010 EMMI video sent to pt's e mail

## 2018-04-05 ENCOUNTER — Encounter: Payer: Self-pay | Admitting: Gastroenterology

## 2018-04-15 ENCOUNTER — Ambulatory Visit
Admission: RE | Admit: 2018-04-15 | Discharge: 2018-04-15 | Disposition: A | Discharge status: Home / Self Care | Payer: 59 | Source: Ambulatory Visit | Attending: Family | Admitting: Family

## 2018-04-15 DIAGNOSIS — Z1231 Encounter for screening mammogram for malignant neoplasm of breast: Secondary | ICD-10-CM

## 2018-04-19 ENCOUNTER — Encounter: Payer: 59 | Admitting: Gastroenterology

## 2018-05-04 ENCOUNTER — Encounter: Payer: 59 | Admitting: Gastroenterology

## 2018-08-18 NOTE — Progress Notes (Signed)
[facility]  PATIENT: Tiffany [name] DOB: 1966-01-20   REASON FOR VISIT: follow-up for generalized epilepsy HISTORY FROM:patient    HISTORY OF PRESENT ILLNESS:UPDATE 3/12/2020CM Tiffany [name], 53 year old female returns for follow-up with history of generalized seizure disorder.  Last seizure occurred 7 years ago.  She is currently on Lamictal 100 mg twice daily without side effects.  Both MRI of the brain and EEG in the past have been normal.  She continues to gain weight.  She denies any interval medical issues she exercises very little.  She returns for reevaluation   UPDATE 08/18/17 CM [name] , 53 year old female returns for yearly follow-up.  She was last seen 08/07/2016.  She has a history of generalized seizure disorder. Her last seizure was approximately 6 years ago. She is currently on Lamictal tolerating the medication without difficulty. EEG in the past has been normal as well as MRI of the brain. She has not had any interval medical problems. She is overweight. She claims she loves breads, pasta and sweets.  She was encouraged to try to lose weight to prevent long-term chronic medical issues.  She returns for reevaluation and refills  REVIEW OF SYSTEMS: Full 14 system review of systems performed and notable only for those listed, all others are neg:  Constitutional: neg  Cardiovascular: neg Ear/Nose/Throat: neg  Skin: neg Eyes: neg Respiratory: neg Gastroitestinal: neg  Hematology/Lymphatic: neg  Endocrine: neg Musculoskeletal:neg Allergy/Immunology: neg Neurological: History of seizure disorder Psychiatric: neg Sleep : neg   ALLERGIES: No Known Allergies  HOME MEDICATIONS: Outpatient Medications Prior to Visit  Medication Sig Dispense Refill  . amLODipine (NORVASC) 10 MG tablet Take 1 tablet (10 mg total) by mouth daily. 90 tablet 3  . GARCINIA CAMBOGIA-CHROMIUM PO Take by mouth.    . lamoTRIgine (LAMICTAL) 100 MG tablet  Take 1 tablet (100 mg total) by mouth 2 (two) times daily. 180 tablet 3  . Multiple Vitamins-Minerals (MULTIVITAMIN WITH MINERALS) tablet Take 1 tablet by mouth daily.     No facility-administered medications prior to visit.     PAST MEDICAL HISTORY: Past Medical History:  Diagnosis Date  . Hypertension   . Microscopic hematuria 09/25/2016   Had complete work up with Dr. Wendy [name].   Cysto unrevealing,  CT scan nl   . Non Hodgkin's lymphoma ([place]) 2/08   stage III  . Obesity   . Seizure disorder ([place])     PAST SURGICAL HISTORY: Past Surgical History:  Procedure Laterality Date  . [place], 2002  . PORT-A-CATH REMOVAL  2009  . PORTACATH PLACEMENT  2008    FAMILY HISTORY: Family History  Problem Relation Age of Onset  . Breast cancer Mother   . Hypertension Mother   . Hypertension Father   . Hypertension Maternal Grandmother   . Hypertension Paternal Grandmother   . Colon cancer Neg Hx   . Colon polyps Neg Hx   . Esophageal cancer Neg Hx   . Stomach cancer Neg Hx   . Rectal cancer Neg Hx     SOCIAL HISTORY: Social History   Socioeconomic History  . Marital status: Married    Spouse name: Tiffany [name]  . Number of children: 2  . Years of education: college  . Highest education level: Not on file  Occupational History  . Occupation: Engineer, building services: [employer]  . Financial resource strain: Not on file  . Food insecurity:    Worry: Not on file  Inability: Not on file  . Transportation needs:    Medical: Not on file    Non-medical: Not on file  Tobacco Use  . Smoking status: Never Smoker  . Smokeless tobacco: Never Used  Substance and Sexual Activity  . Alcohol use: No  . Drug use: No  . Sexual activity: Yes    Partners: Male  Lifestyle  . Physical activity:    Days per week: Not on file    Minutes per session: Not on file  . Stress: Not on file  Relationships  . Social connections:    Talks on phone: Not  on file    Gets together: Not on file    Attends religious service: Not on file    Active member of club or organization: Not on file    Attends meetings of clubs or organizations: Not on file    Relationship status: Not on file  . Intimate partner violence:    Fear of current or ex partner: Not on file    Emotionally abused: Not on file    Physically abused: Not on file    Forced sexual activity: Not on file  Other Topics Concern  . Not on file  Social History Narrative   2 children   Married   Works as a Charity fundraiser   enjoys shopping, movies     PHYSICAL EXAM  Vitals:   08/19/18 0753  BP: 126/84  Pulse: 91  Weight: 173 lb 6.4 oz (78.7 kg)  Height: 4\' 11"  (1.499 m)   Body mass index is 35.02 kg/m. Generalized: Well developed, Obese female in no acute distress  Head: normocephalic and atraumatic,. Oropharynx benign  Musculoskeletal: No deformity   Neurological examination   Mentation: Alert oriented to time, place, history taking. Attention span and concentration appropriate. Recent and remote memory intact. Follows all commands speech and language fluent.   Cranial nerve II-XII: Pupils were equal round reactive to light extraocular movements were full, visual field were full on confrontational test. Facial sensation and strength were normal. hearing was intact to finger rubbing bilaterally. Uvula tongue midline. head turning and shoulder shrug were normal and symmetric.Tongue protrusion into cheek strength was normal. Motor: normal bulk and tone, full strength in the BUE, BLE, fine finger movements normal, . Sensory: normal and symmetric to light touch,  Coordination: finger-nose-finger, heel-to-shin bilaterally, no dysmetria, no tremor Reflexes: Symmetric upper and lower plantar responses were flexor bilaterally. Gait and Station: Rising up from seated position without assistance, normal stance, moderate stride, good arm swing, smooth turning, able to perform  tiptoe, and heel walking without difficulty. Tandem gait is steady  DIAGNOSTIC DATA (LABS, IMAGING, TESTING)   ASSESSMENT AND PLAN  53 y.o. year old female  has a past medical history of Non Hodgkin's lymphoma ([place]) (2/08); Obesity; Seizure disorder ([place]); Hypertension; . here to follow-up. Last seizure was 7 years ago.  She is currently on Lamictal tolerating without side effects.  Continue Lamictal at current dose will refill for 3 months with 3 refills Follow-up yearly and when necessary Call for any seizure activity Try to limit carbs  and get regular exercise for weight loss Reviewed common seizure triggers I spent 20 minutes in total face to face time with the patient more than 50% of which was spent counseling and coordination of care, reviewing test results reviewing medications and discussing and reviewing the diagnosis of seizure disorder and her obesity as it affects her overall general health.  She was strongly encouraged to lose  weight and exercise. Dennie Bible, [facility], [facility], APRN  [facility] [address] [place], [place] [address] [phone]

## 2018-08-19 ENCOUNTER — Other Ambulatory Visit: Payer: Self-pay

## 2018-08-19 ENCOUNTER — Ambulatory Visit (INDEPENDENT_AMBULATORY_CARE_PROVIDER_SITE_OTHER): Payer: 59 | Admitting: Nurse Practitioner

## 2018-08-19 ENCOUNTER — Encounter: Payer: Self-pay | Admitting: Nurse Practitioner

## 2018-08-19 VITALS — BP 126/84 | HR 91 | Ht 59.0 in | Wt 173.4 lb

## 2018-08-19 DIAGNOSIS — G40309 Generalized idiopathic epilepsy and epileptic syndromes, not intractable, without status epilepticus: Secondary | ICD-10-CM | POA: Diagnosis not present

## 2018-08-19 MED ORDER — LAMOTRIGINE 100 MG PO TABS: 100.0000 mg | ORAL_TABLET | Freq: Two times a day (BID) | ORAL | 3 refills | Status: DC

## 2018-08-19 NOTE — Patient Instructions (Signed)
Continue Lamictal at current dose will refill for 3 months with 3 refills Follow-up yearly and when necessary Call for any seizure activity Try to limit carbs  and get regular exercise for weight loss Reviewed common seizure triggers

## 2018-08-19 NOTE — Progress Notes (Signed)
I agree with the above plan 

## 2018-11-21 IMAGING — MG DIGITAL SCREENING BILATERAL MAMMOGRAM WITH TOMO AND CAD
8 series · 8 of 24 positions shown · non-contrast
Comparison: Previous exam(s).

CLINICAL DATA: Screening.

EXAM:
DIGITAL SCREENING BILATERAL MAMMOGRAM WITH TOMO AND CAD

[R CC synth-2D]
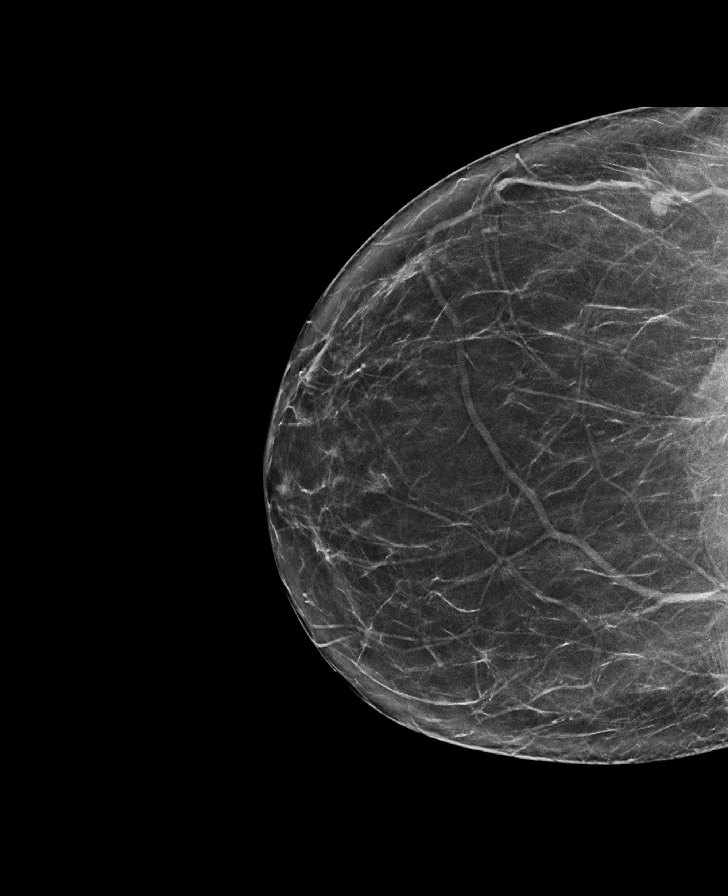

[R MLO synth-2D]
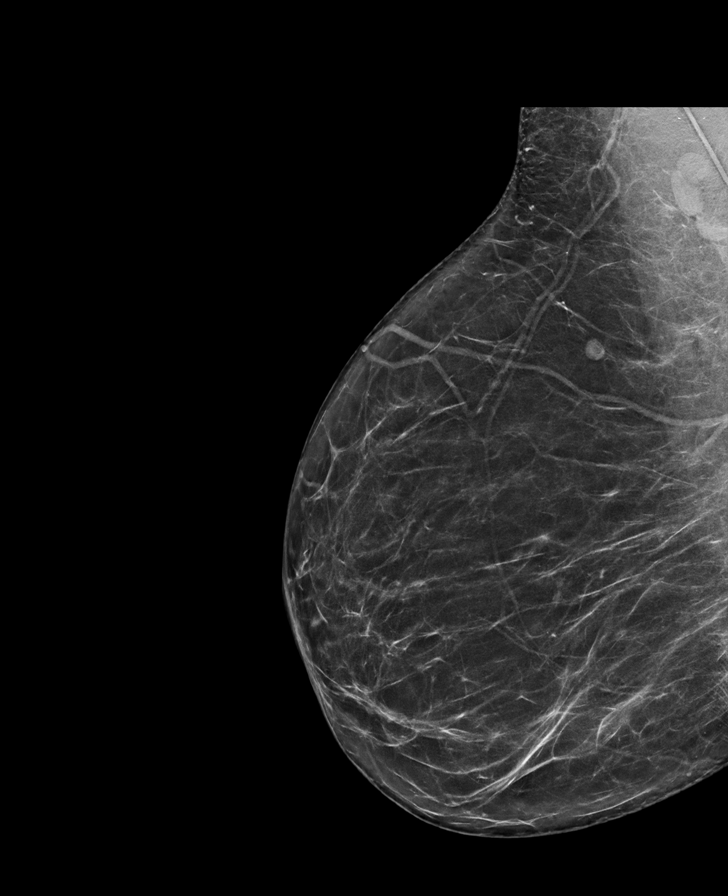

[L CC synth-2D]
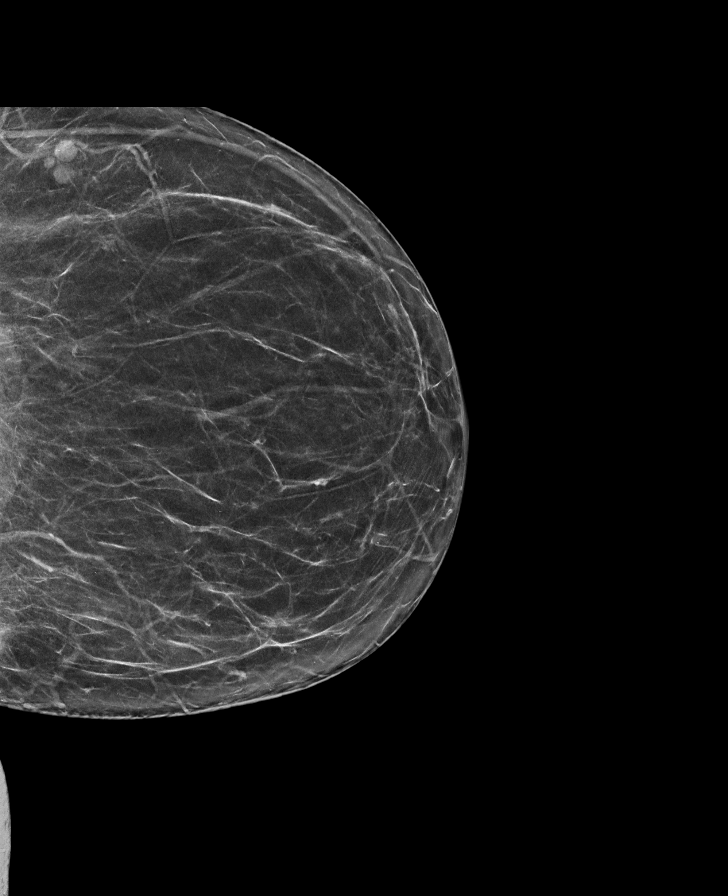

[L MLO synth-2D]
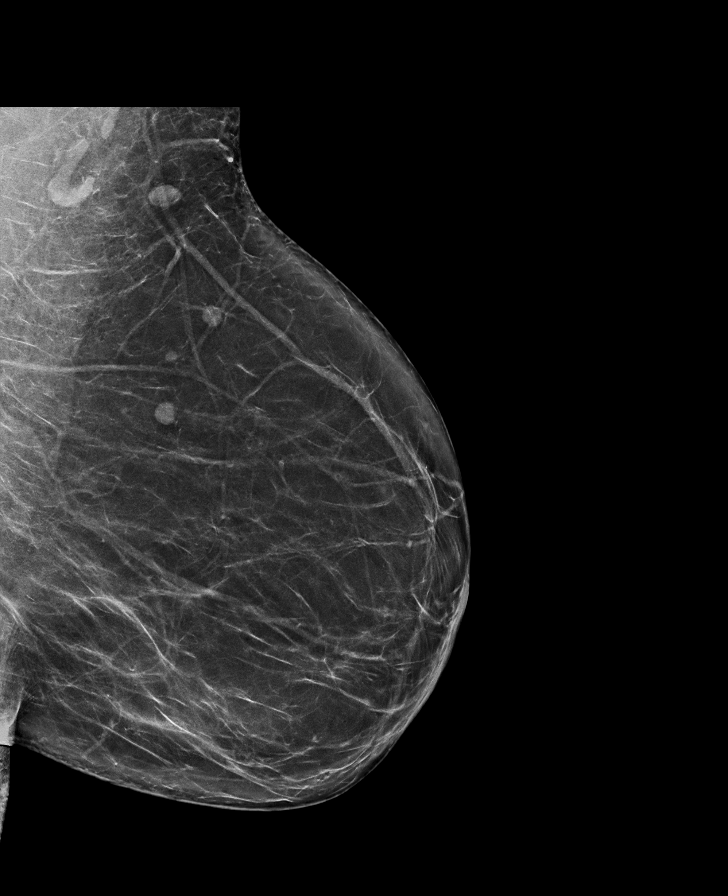

[L MLO tomo · tomo slice 41/81.0]
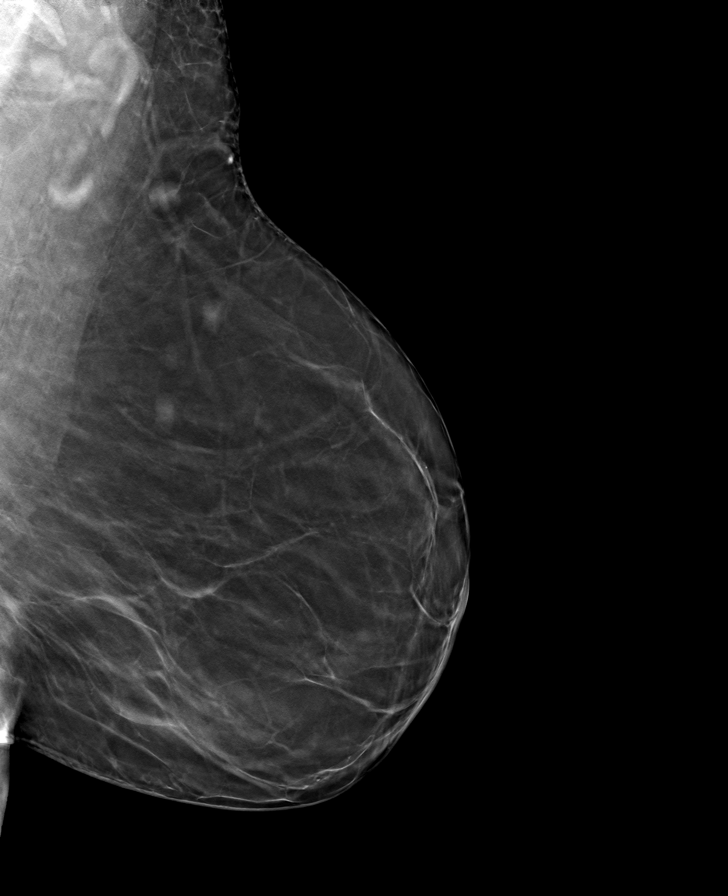

[R MLO tomo · tomo slice 39/78.0]
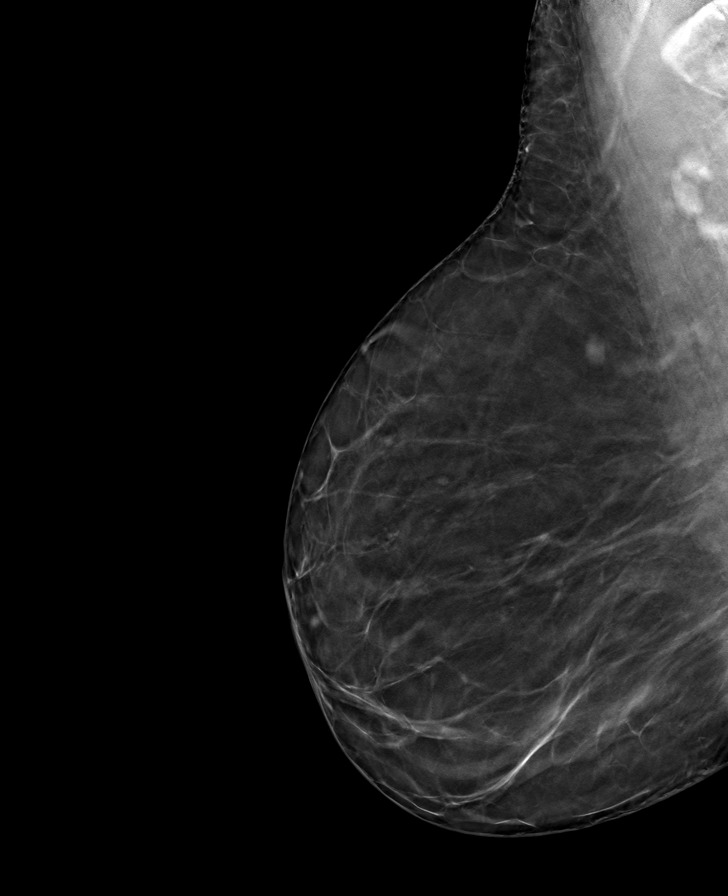

[R CC tomo · tomo slice 37/73.0]
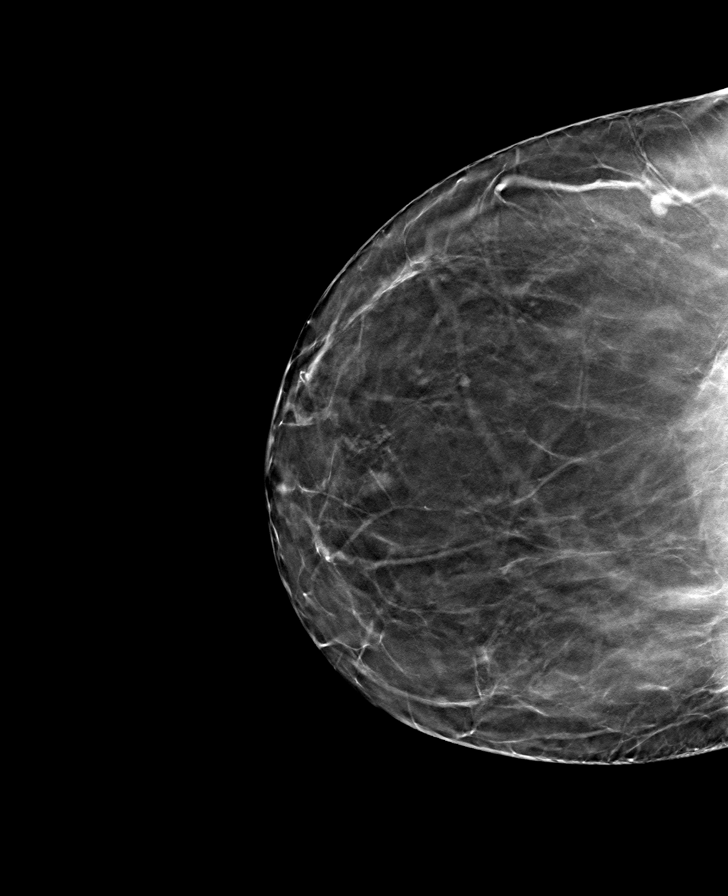

[L CC tomo · tomo slice 35/68.0]
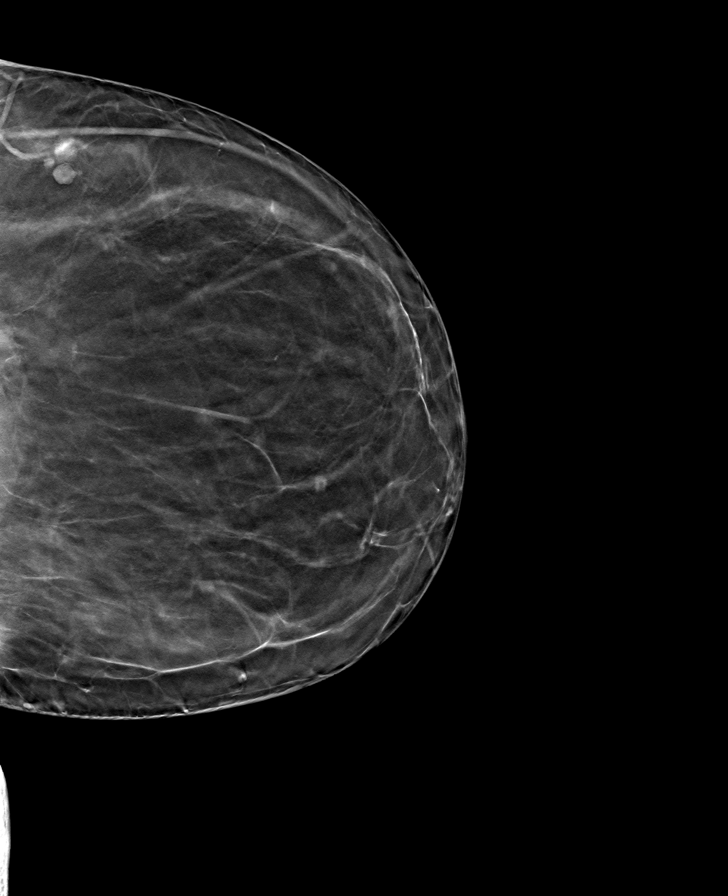

[8 of 24 positions shown; findings below may reference images not displayed]

ACR Breast Density Category b: There are scattered areas of
fibroglandular density.
FINDINGS: There are no findings suspicious for malignancy. Images were
processed with CAD.
IMPRESSION: No mammographic evidence of malignancy. A result letter of this
screening mammogram will be mailed directly to the patient.

RECOMMENDATION:
Screening mammogram in one year. (Code:[id number])

BI-RADS CATEGORY  1: Negative.

## 2019-01-04 ENCOUNTER — Other Ambulatory Visit: Payer: Self-pay

## 2019-01-04 ENCOUNTER — Emergency Department (HOSPITAL_BASED_OUTPATIENT_CLINIC_OR_DEPARTMENT_OTHER): Payer: 59

## 2019-01-04 ENCOUNTER — Encounter (HOSPITAL_BASED_OUTPATIENT_CLINIC_OR_DEPARTMENT_OTHER): Payer: Self-pay | Admitting: *Deleted

## 2019-01-04 ENCOUNTER — Emergency Department (HOSPITAL_BASED_OUTPATIENT_CLINIC_OR_DEPARTMENT_OTHER)
Admission: EM | Admit: 2019-01-04 | Discharge: 2019-01-04 | Disposition: A | Discharge status: Home / Self Care | Payer: 59 | Attending: Emergency Medicine | Admitting: Emergency Medicine

## 2019-01-04 DIAGNOSIS — Z20828 Contact with and (suspected) exposure to other viral communicable diseases: Secondary | ICD-10-CM | POA: Diagnosis not present

## 2019-01-04 DIAGNOSIS — J209 Acute bronchitis, unspecified: Secondary | ICD-10-CM | POA: Insufficient documentation

## 2019-01-04 DIAGNOSIS — Z79899 Other long term (current) drug therapy: Secondary | ICD-10-CM | POA: Insufficient documentation

## 2019-01-04 DIAGNOSIS — I10 Essential (primary) hypertension: Secondary | ICD-10-CM | POA: Diagnosis not present

## 2019-01-04 DIAGNOSIS — R002 Palpitations: Secondary | ICD-10-CM | POA: Diagnosis present

## 2019-01-04 DIAGNOSIS — I493 Ventricular premature depolarization: Secondary | ICD-10-CM | POA: Insufficient documentation

## 2019-01-04 DIAGNOSIS — Z8572 Personal history of non-Hodgkin lymphomas: Secondary | ICD-10-CM | POA: Insufficient documentation

## 2019-01-04 DIAGNOSIS — R059 Cough, unspecified: Secondary | ICD-10-CM

## 2019-01-04 DIAGNOSIS — R05 Cough: Secondary | ICD-10-CM

## 2019-01-04 LAB — HEPATIC FUNCTION PANEL
ALT: 17 U/L (ref 0–44)
AST: 19 U/L (ref 15–41)
Albumin: 4.3 g/dL (ref 3.5–5.0)
Alkaline Phosphatase: 104 U/L (ref 38–126)
Bilirubin, Direct: <0.1 mg/dL (ref 0.0–0.2)
Total Bilirubin: 0.5 mg/dL (ref 0.3–1.2)
Total Protein: 7.5 g/dL (ref 6.5–8.1)

## 2019-01-04 LAB — CBC WITH DIFFERENTIAL/PLATELET
Abs Immature Granulocytes: 0.04 10*3/uL (ref 0.00–0.07)
Basophils Absolute: 0 10*3/uL (ref 0.0–0.1)
Basophils Relative: 0 %
Eosinophils Absolute: 0.3 10*3/uL (ref 0.0–0.5)
Eosinophils Relative: 3 %
HCT: 37.8 % (ref 36.0–46.0)
Hemoglobin: 11.8 g/dL — ABNORMAL LOW (ref 12.0–15.0)
Immature Granulocytes: 0 %
Lymphocytes Relative: 28 %
Lymphs Abs: 3 10*3/uL (ref 0.7–4.0)
MCH: 25.5 pg — ABNORMAL LOW (ref 26.0–34.0)
MCHC: 31.2 g/dL (ref 30.0–36.0)
MCV: 81.8 fL (ref 80.0–100.0)
Monocytes Absolute: 0.6 10*3/uL (ref 0.1–1.0)
Monocytes Relative: 5 %
Neutro Abs: 6.8 10*3/uL (ref 1.7–7.7)
Neutrophils Relative %: 64 %
Platelets: 413 10*3/uL — ABNORMAL HIGH (ref 150–400)
RBC: 4.62 MIL/uL (ref 3.87–5.11)
RDW: 14.5 % (ref 11.5–15.5)
WBC: 10.7 10*3/uL — ABNORMAL HIGH (ref 4.0–10.5)
nRBC: 0 % (ref 0.0–0.2)

## 2019-01-04 LAB — TSH: TSH: 0.893 u[IU]/mL (ref 0.350–4.500)

## 2019-01-04 LAB — BASIC METABOLIC PANEL
Anion gap: 10 (ref 5–15)
BUN: 15 mg/dL (ref 6–20)
CO2: 26 mmol/L (ref 22–32)
Calcium: 9.3 mg/dL (ref 8.9–10.3)
Chloride: 101 mmol/L (ref 98–111)
Creatinine, Ser: 0.84 mg/dL (ref 0.44–1.00)
GFR calc Af Amer: >60 mL/min (ref >60)
GFR calc non Af Amer: >60 mL/min (ref >60)
Glucose, Bld: 145 mg/dL — ABNORMAL HIGH (ref 70–99)
Potassium: 3.8 mmol/L (ref 3.5–5.1)
Sodium: 137 mmol/L (ref 135–145)

## 2019-01-04 LAB — TROPONIN I (HIGH SENSITIVITY): Troponin I (High Sensitivity): 2 ng/L (ref <18)

## 2019-01-04 LAB — MAGNESIUM: Magnesium: 2 mg/dL (ref 1.7–2.4)

## 2019-01-04 LAB — D-DIMER, QUANTITATIVE: D-Dimer, Quant: <0.27 ug/mL-FEU (ref 0.00–0.50)

## 2019-01-04 MED ORDER — AZITHROMYCIN 250 MG PO TABS: 250.0000 mg | ORAL_TABLET | Freq: Every day | ORAL | 0 refills | Status: DC

## 2019-01-04 MED ORDER — ALUM & MAG HYDROXIDE-SIMETH 200-200-20 MG/5ML PO SUSP
30.0000 mL | Freq: Once | ORAL | Status: AC
  Administered 2019-01-04: 20:00:00 30 mL via ORAL
  Filled 2019-01-04: qty 30

## 2019-01-04 MED ORDER — LIDOCAINE VISCOUS HCL 2 % MT SOLN
15.0000 mL | Freq: Once | OROMUCOSAL | Status: AC
  Administered 2019-01-04: 15 mL via ORAL
  Filled 2019-01-04: qty 15

## 2019-01-04 NOTE — ED Provider Notes (Signed)
[facility] EMERGENCY DEPARTMENT Provider Note   CSN: [id number] Arrival date & time: 01/04/19  1912    History   Chief Complaint Chief Complaint  Patient presents with  . Palpitations    HPI Katora Padin is a 53 y.o. female.     Pt presents to the ED today with coughing starting last night.  She also has had some palpitations.  She took a picture of the phlegm she brought up and it had some blood in it.  She has not had any fevers.  No known covid contacts.  She does have a hx of NHL, but it is in remission.  She does not smoke.     Past Medical History:  Diagnosis Date  . Hypertension   . Microscopic hematuria 09/25/2016   Had complete work up with Dr. Wendy Poet.   Cysto unrevealing,  CT scan nl   . Non Hodgkin's lymphoma ([place]) 2/08   stage III  . Obesity   . Seizure disorder [facility])     Patient Active Problem List   Diagnosis Date Noted  . Microscopic hematuria 09/25/2016  . History of neck swelling 12/04/2014  . Vitamin D deficiency 10/27/2014  . GERD (gastroesophageal reflux disease) 10/25/2014  . Sciatica of right side 10/25/2014  . Constipation 10/25/2014  . Insomnia 04/06/2013  . Seizure disorder, 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[place]) 01/27/2013  . Essential hypertension 06/22/2012  . Non Hodgkin's lymphoma ([place]) 11/12/2011  . Family history of breast cancer 11/12/2011  . History of seizure disorder 11/12/2011  . Hematuria 02/13/2011    Past Surgical History:  Procedure Laterality Date  . [place], 2002  . PORT-A-CATH REMOVAL  2009  . PORTACATH PLACEMENT  2008     OB History    Gravida  2   Para  2   Term      Preterm      AB      Living        SAB      TAB      Ectopic      Multiple      Live Births               Home Medications    Prior to Admission medications   Medication Sig Start Date End Date Taking? Authorizing Provider  amLODipine (NORVASC) 10 MG tablet Take 1 tablet (10 mg total) by  mouth daily. 02/03/18  Yes Wendling, Crosby Oyster, DO  GARCINIA CAMBOGIA-CHROMIUM PO Take by mouth.   Yes [provider]  lamoTRIgine (LAMICTAL) 100 MG tablet Take 1 tablet (100 mg total) by mouth 2 (two) times daily. 08/19/18  Yes Dennie Bible, NP  Multiple Vitamins-Minerals (MULTIVITAMIN WITH MINERALS) tablet Take 1 tablet by mouth daily.   Yes [provider]  azithromycin (ZITHROMAX) 250 MG tablet Take 1 tablet (250 mg total) by mouth daily. Take first 2 tablets together, then 1 every day until finished. 01/04/19   Isla Pence, MD    Family History Family History  Problem Relation Age of Onset  . Breast cancer Mother   . Hypertension Mother   . Hypertension Father   . Hypertension Maternal Grandmother   . Hypertension Paternal Grandmother   . Colon cancer Neg Hx   . Colon polyps Neg Hx   . Esophageal cancer Neg Hx   . Stomach cancer Neg Hx   . Rectal cancer Neg Hx     Social History Social History   Tobacco Use  .  Smoking status: Never Smoker  . Smokeless tobacco: Never Used  Substance Use Topics  . Alcohol use: No  . Drug use: No     Allergies   Patient has no known allergies.   Review of Systems Review of Systems  Respiratory: Positive for cough.   Cardiovascular: Positive for palpitations.  All other systems reviewed and are negative.    Physical Exam Updated Vital Signs BP 134/82   Pulse (!) 105   Temp 98.6 F (37 C) (Oral)   Resp 20   Ht 4\' 11"  (1.499 m)   Wt 78.9 kg   SpO2 100%   BMI 35.14 kg/m   Physical Exam Vitals signs and nursing note reviewed.  Constitutional:      Appearance: Normal appearance.  HENT:     Head: Normocephalic and atraumatic.     Right Ear: External ear normal.     Left Ear: External ear normal.     Nose: Nose normal.     Mouth/Throat:     Mouth: Mucous membranes are moist.     Pharynx: Oropharynx is clear.  Eyes:     Extraocular Movements: Extraocular movements intact.      Conjunctiva/sclera: Conjunctivae normal.     Pupils: Pupils are equal, round, and reactive to light.  Neck:     Musculoskeletal: Normal range of motion and neck supple.  Cardiovascular:     Rate and Rhythm: Normal rate and regular rhythm.     Pulses: Normal pulses.     Heart sounds: Normal heart sounds.  Pulmonary:     Effort: Pulmonary effort is normal.     Breath sounds: Normal breath sounds.  Abdominal:     General: Abdomen is flat. Bowel sounds are normal.     Palpations: Abdomen is soft.  Musculoskeletal: Normal range of motion.  Skin:    General: Skin is warm.     Capillary Refill: Capillary refill takes less than 2 seconds.  Neurological:     General: No focal deficit present.     Mental Status: She is alert and oriented to person, place, and time.  Psychiatric:        Mood and Affect: Mood normal.        Behavior: Behavior normal.      ED Treatments / Results  Labs (all labs ordered are listed, but only abnormal results are displayed) Labs Reviewed  CBC WITH DIFFERENTIAL/PLATELET - Abnormal; Notable for the following components:      Result Value   WBC 10.7 (*)    Hemoglobin 11.8 (*)    MCH 25.5 (*)    Platelets 413 (*)    All other components within normal limits  BASIC METABOLIC PANEL - Abnormal; Notable for the following components:   Glucose, Bld 145 (*)    All other components within normal limits  NOVEL CORONAVIRUS, NAA (HOSPITAL ORDER, SEND-OUT TO REF LAB)  MAGNESIUM  D-DIMER, QUANTITATIVE (NOT AT [facility])  HEPATIC FUNCTION PANEL  TSH  TROPONIN I (HIGH SENSITIVITY)    EKG EKG Interpretation  Date/Time:  Tuesday January 04 2019 19:26:54 EDT Ventricular Rate:  106 PR Interval:  158 QRS Duration: 88 QT Interval:  348 QTC Calculation: 462 R Axis:   41 Text Interpretation:  Sinus tachycardia with occasional Premature ventricular complexes Otherwise normal ECG Since last tracing rate faster Confirmed by Isla Pence [phone]) on 01/04/2019 7:33:15 PM    Radiology Dg Chest Port 1 View  Result Date: 01/04/2019 CLINICAL DATA:  Initial evaluation for acute palpitations,  cough. EXAM: PORTABLE CHEST 1 VIEW COMPARISON:  Prior radiograph from 07/18/2014. FINDINGS: The cardiac and mediastinal silhouettes are stable in size and contour, and remain within normal limits. The lungs are normally inflated. Minimal bibasilar subsegmental atelectatic changes. No airspace consolidation, pleural effusion, or pulmonary edema is identified. There is no pneumothorax. No acute osseous abnormality identified. IMPRESSION: 1. Minimal bibasilar subsegmental atelectatic changes. 2. No other active cardiopulmonary disease. Electronically Signed   By: Jeannine Boga M.D.   On: 01/04/2019 20:21    Procedures Procedures (including critical care time)  Medications Ordered in ED Medications  alum & mag hydroxide-simeth (MAALOX/MYLANTA) 200-200-20 MG/5ML suspension 30 mL (30 mLs Oral Given 01/04/19 1959)    And  lidocaine (XYLOCAINE) 2 % viscous mouth solution 15 mL (15 mLs Oral Given 01/04/19 1959)     Initial Impression / Assessment and Plan / ED Course  I have reviewed the triage vital signs and the nursing notes.  Pertinent labs & imaging results that were available during my care of the patient were reviewed by me and considered in my medical decision making (see chart for details).       Pt's labs and xray reviewed.  She is feeling better.  She will be treated with zithromax.  Covid swab done and pending.  Pt knows to return if worse.  F/u with pcp.  Annalyce Foster-Abbitt was evaluated in Emergency Department on 01/04/2019 for the symptoms described in the history of present illness. She was evaluated in the context of the global COVID-19 pandemic, which necessitated consideration that the patient might be at risk for infection with the SARS-CoV-2 virus that causes COVID-19. Institutional protocols and algorithms that pertain to the evaluation of patients at risk  for COVID-19 are in a state of rapid change based on information released by regulatory bodies including the CDC and federal and state organizations. These policies and algorithms were followed during the patient's care in the ED.  Final Clinical Impressions(s) / ED Diagnoses   Final diagnoses:  PVC (premature ventricular contraction)  Acute bronchitis, unspecified organism    ED Discharge Orders         Ordered    azithromycin (ZITHROMAX) 250 MG tablet  Daily     01/04/19 2032           Isla Pence, MD 01/04/19 2033

## 2019-01-04 NOTE — ED Triage Notes (Signed)
Palpitations since this am. Cough since last night.

## 2019-01-08 LAB — NOVEL CORONAVIRUS, NAA (HOSP ORDER, SEND-OUT TO REF LAB; TAT 18-24 HRS): SARS-CoV-2, NAA: NOT DETECTED

## 2019-01-25 ENCOUNTER — Other Ambulatory Visit: Payer: Self-pay | Admitting: Family

## 2019-01-25 DIAGNOSIS — I10 Essential (primary) hypertension: Secondary | ICD-10-CM

## 2019-01-26 ENCOUNTER — Other Ambulatory Visit: Payer: Self-pay

## 2019-01-26 ENCOUNTER — Ambulatory Visit (INDEPENDENT_AMBULATORY_CARE_PROVIDER_SITE_OTHER): Payer: 59 | Admitting: Family

## 2019-01-26 DIAGNOSIS — R739 Hyperglycemia, unspecified: Secondary | ICD-10-CM

## 2019-01-26 DIAGNOSIS — G40309 Generalized idiopathic epilepsy and epileptic syndromes, not intractable, without status epilepticus: Secondary | ICD-10-CM

## 2019-01-26 DIAGNOSIS — I10 Essential (primary) hypertension: Secondary | ICD-10-CM

## 2019-01-26 DIAGNOSIS — E559 Vitamin D deficiency, unspecified: Secondary | ICD-10-CM | POA: Diagnosis not present

## 2019-01-26 MED ORDER — AMLODIPINE BESYLATE 10 MG PO TABS: 10.0000 mg | ORAL_TABLET | Freq: Every day | ORAL | 1 refills | Status: DC

## 2019-01-26 NOTE — Telephone Encounter (Signed)
14 day refill of amlodipine sent to pharmacy but she is overdue for follow up. Please contact pt to schedule a follow up appointment prior to additional refills.

## 2019-01-26 NOTE — Telephone Encounter (Signed)
Patient scheduled for virtual visit today.  

## 2019-01-26 NOTE — Progress Notes (Signed)
Virtual Visit via Video Note  I connected with Tiffany [name] on 01/26/19 at 12:40 PM EDT by a video enabled telemedicine application and verified that I am speaking with the correct person using two identifiers.  Location: Patient: work Provider: home   I discussed the limitations of evaluation and management by telemedicine and the availability of in person appointments. The patient expressed understanding and agreed to proceed.  History of Present Illness:   Patient is a 53 yr old female who presents today for follow up.  HTN- patient is maintained on amlodipine.  Patients reports that she has been checking at home 130/78, 128/84, 140/82. She continues amlodipine 10mg . Denies CP, SOB.   BP Readings from Last 3 Encounters:  01/04/19 134/82  08/19/18 126/84  03/02/18 128/64   Seizure disorder- maintained on lamictal. Reports that she continues to follow up with neurology.   Vit D deficiency- reports that she continues mvi and vit d supplement.   No concerns today. She did have an ER visit in the end of July with c/o cough and palpitations, but reports that her symptoms are resolved since that time.    Past Medical History:  Diagnosis Date  . Hypertension   . Microscopic hematuria 09/25/2016   Had complete work up with Dr. Wendy Poet.   Cysto unrevealing,  CT scan nl   . Non Hodgkin's lymphoma ([place]) 2/08   stage III  . Obesity   . Seizure disorder [facility])      Social History   Socioeconomic History  . Marital status: Married    Spouse name: Iona Beard  . Number of children: 2  . Years of education: college  . Highest education level: Not on file  Occupational History  . Occupation: Engineer, building services: [employer]  . Financial resource strain: Not on file  . Food insecurity    Worry: Not on file    Inability: Not on file  . Transportation needs    Medical: Not on file    Non-medical: Not on file  Tobacco Use  . Smoking status: Never  Smoker  . Smokeless tobacco: Never Used  Substance and Sexual Activity  . Alcohol use: No  . Drug use: No  . Sexual activity: Yes    Partners: Male  Lifestyle  . Physical activity    Days per week: Not on file    Minutes per session: Not on file  . Stress: Not on file  Relationships  . Social Herbalist on phone: Not on file    Gets together: Not on file    Attends religious service: Not on file    Active member of club or organization: Not on file    Attends meetings of clubs or organizations: Not on file    Relationship status: Not on file  . Intimate partner violence    Fear of current or ex partner: Not on file    Emotionally abused: Not on file    Physically abused: Not on file    Forced sexual activity: Not on file  Other Topics Concern  . Not on file  Social History Narrative   2 children   Married   Works as a Charity fundraiser   enjoys shopping, movies    Past Surgical History:  Procedure Laterality Date  . [place], 2002  . PORT-A-CATH REMOVAL  2009  . PORTACATH PLACEMENT  2008    Family History  Problem Relation Age of  Onset  . Breast cancer Mother   . Hypertension Mother   . Hypertension Father   . Hypertension Maternal Grandmother   . Hypertension Paternal Grandmother   . Colon cancer Neg Hx   . Colon polyps Neg Hx   . Esophageal cancer Neg Hx   . Stomach cancer Neg Hx   . Rectal cancer Neg Hx     No Known Allergies  Current Outpatient Medications on File Prior to Visit  Medication Sig Dispense Refill  . GARCINIA CAMBOGIA-CHROMIUM PO Take by mouth.    . lamoTRIgine (LAMICTAL) 100 MG tablet Take 1 tablet (100 mg total) by mouth 2 (two) times daily. 180 tablet 3  . Multiple Vitamins-Minerals (MULTIVITAMIN WITH MINERALS) tablet Take 1 tablet by mouth daily.     No current facility-administered medications on file prior to visit.     There were no vitals taken for this visit.    Observations/Objective:   Gen: Awake,  alert, no acute distress Resp: Breathing is even and non-labored Psych: calm/pleasant demeanor Neuro: Alert and Oriented x 3, + facial symmetry, speech is clear.   Assessment and Plan:  HTN- bp stable on amlodipine.  Continue same.    Hyperglycemia- noted on lab work in ED (glucose 146) Pt was non-fasting when this was drawn. Will continue to monitor.  Seizure disorder- stable on lamictal, management per neurology.  Vit D deficiency- continue vitamin D. Plan to check follow up level at her upcoming cpx.   Follow Up Instructions:    I discussed the assessment and treatment plan with the patient. The patient was provided an opportunity to ask questions and all were answered. The patient agreed with the plan and demonstrated an understanding of the instructions.   The patient was advised to call back or seek an in-person evaluation if the symptoms worsen or if the condition fails to improve as anticipated.  Nance Pear, NP

## 2019-03-23 ENCOUNTER — Telehealth: Payer: Self-pay

## 2019-03-23 DIAGNOSIS — I493 Ventricular premature depolarization: Secondary | ICD-10-CM

## 2019-03-23 NOTE — Telephone Encounter (Signed)
Referral has been placed. 

## 2019-03-23 NOTE — Telephone Encounter (Signed)
Copied from [place] [id number]. Topic: General - Other >> Mar 21, 2019  2:39 PM [name] wrote: Pt call to say she need a referral sent to Dr Bettina Gavia the cardiologist  she has an appt 04/01/2019  Patient advised referral was placed

## 2019-04-01 ENCOUNTER — Ambulatory Visit: Payer: 59 | Admitting: Cardiology

## 2019-04-04 ENCOUNTER — Encounter: Payer: Self-pay | Admitting: Cardiology

## 2019-04-04 ENCOUNTER — Other Ambulatory Visit: Payer: Self-pay

## 2019-04-04 ENCOUNTER — Ambulatory Visit (INDEPENDENT_AMBULATORY_CARE_PROVIDER_SITE_OTHER): Payer: 59 | Admitting: Cardiology

## 2019-04-04 VITALS — BP 134/88 | HR 104 | Ht 59.0 in | Wt 176.0 lb

## 2019-04-04 DIAGNOSIS — R079 Chest pain, unspecified: Secondary | ICD-10-CM | POA: Diagnosis not present

## 2019-04-04 DIAGNOSIS — R0789 Other chest pain: Secondary | ICD-10-CM

## 2019-04-04 DIAGNOSIS — R002 Palpitations: Secondary | ICD-10-CM

## 2019-04-04 DIAGNOSIS — I10 Essential (primary) hypertension: Secondary | ICD-10-CM | POA: Diagnosis not present

## 2019-04-04 NOTE — Patient Instructions (Addendum)
Medication Instructions:  Your physician recommends that you continue on your current medications as directed. Please refer to the Current Medication list given to you today.  If you need a refill on your cardiac medications before your next appointment, please call your pharmacy.   Lab work: NONE  If you have labs (blood work) drawn today and your tests are completely normal, you will receive your results only by: [name] MyChart Message (if you have MyChart) OR . A paper copy in the mail If you have any lab test that is abnormal or we need to change your treatment, we will call you to review the results.  Testing/Procedures: Your physician has recommended that you wear a ZIO monitor.ZIO monitors are medical devices that record the heart's electrical activity. Doctors most often use these monitors to diagnose arrhythmias. Arrhythmias are problems with the speed or rhythm of the heartbeat. The monitor is a small, portable device. You can wear one while you do your normal daily activities. This is usually used to diagnose what is causing palpitations/syncope (passing out).You will wear this device for 14 days  Your physician has requested that you have an echocardiogram. Echocardiography is a painless test that uses sound waves to create images of your heart. It provides your doctor with information about the size and shape of your heart and how well your heart's chambers and valves are working. This procedure takes approximately one hour. There are no restrictions for this procedure.  Your physician has requested that you have a lexiscan myoview. For further information please visit [email or web address]. Please follow instruction sheet, as given.    Follow-Up: At [facility], you and your health needs are our priority.  As part of our continuing mission to provide you with exceptional heart care, we have created designated Provider Care Teams.  These Care Teams include your primary Cardiologist  (physician) and Advanced Practice Providers (APPs -  Physician Assistants and Nurse Practitioners) who all work together to provide you with the care you need, when you need it. You will need a follow up appointment in 2 months.   Any Other Special Instructions Will Be Listed Below  Regadenoson injection What is this medicine? REGADENOSON is used to test the heart for coronary artery disease. It is used in patients who can not exercise for their stress test. This medicine may be used for other purposes; ask your health care provider or pharmacist if you have questions. COMMON BRAND NAME(S): Lexiscan What should I tell my health care provider before I take this medicine? They need to know if you have any of these conditions:  heart problems  lung or breathing disease, like asthma or COPD  an unusual or allergic reaction to regadenoson, other medicines, foods, dyes, or preservatives  pregnant or trying to get pregnant  breast-feeding How should I use this medicine? This medicine is for injection into a vein. It is given by a health care professional in a hospital or clinic setting. Talk to your pediatrician regarding the use of this medicine in children. Special care may be needed. Overdosage: If you think you have taken too much of this medicine contact a poison control center or emergency room at once. NOTE: This medicine is only for you. Do not share this medicine with others. What if I miss a dose? This does not apply. What may interact with this medicine?  caffeine  dipyridamole  guarana  theophylline This list may not describe all possible interactions. Give your health care provider a  list of all the medicines, herbs, non-prescription drugs, or dietary supplements you use. Also tell them if you smoke, drink alcohol, or use illegal drugs. Some items may interact with your medicine. What should I watch for while using this medicine? Your condition will be monitored carefully  while you are receiving this medicine. Do not take medicines, foods, or drinks with caffeine (like coffee, tea, or colas) for at least 12 hours before your test. If you do not know if something contains caffeine, ask your health care professional. What side effects may I notice from receiving this medicine? Side effects that you should report to your doctor or health care professional as soon as possible:  allergic reactions like skin rash, itching or hives, swelling of the face, lips, or tongue  breathing problems  chest pain, tightness or palpitations  severe headache Side effects that usually do not require medical attention (report to your doctor or health care professional if they continue or are bothersome):  flushing  headache  irritation or pain at site where injected  nausea, vomiting This list may not describe all possible side effects. Call your doctor for medical advice about side effects. You may report side effects to FDA at 1-800-FDA-1088. Where should I keep my medicine? This drug is given in a hospital or clinic and will not be stored at home. NOTE: This sheet is a summary. It may not cover all possible information. If you have questions about this medicine, talk to your doctor, pharmacist, or health care provider.  2020 Elsevier/Gold Standard (2008-01-24 15:08:13)  Cardiac Nuclear Scan A cardiac nuclear scan is a test that is done to check the flow of blood to your heart. It is done when you are resting and when you are exercising. The test looks for problems such as:  Not enough blood reaching a portion of the heart.  The heart muscle not working as it should. You may need this test if:  You have heart disease.  You have had lab results that are not normal.  You have had heart surgery or a balloon procedure to open up blocked arteries (angioplasty).  You have chest pain.  You have shortness of breath. In this test, a special dye (tracer) is put into  your bloodstream. The tracer will travel to your heart. A camera will then take pictures of your heart to see how the tracer moves through your heart. This test is usually done at a hospital and takes 2-4 hours. Tell a doctor about:  Any allergies you have.  All medicines you are taking, including vitamins, herbs, eye drops, creams, and over-the-counter medicines.  Any problems you or family members have had with anesthetic medicines.  Any blood disorders you have.  Any surgeries you have had.  Any medical conditions you have.  Whether you are pregnant or may be pregnant. What are the risks? Generally, this is a safe test. However, problems may occur, such as:  Serious chest pain and heart attack. This is only a risk if the stress portion of the test is done.  Rapid heartbeat.  A feeling of warmth in your chest. This feeling usually does not last long.  Allergic reaction to the tracer. What happens before the test?  Ask your doctor about changing or stopping your normal medicines. This is important.  Follow instructions from your doctor about what you cannot eat or drink.  Remove your jewelry on the day of the test. What happens during the test?  An IV  tube will be inserted into one of your veins.  Your doctor will give you a small amount of tracer through the IV tube.  You will wait for 20-40 minutes while the tracer moves through your bloodstream.  Your heart will be monitored with an electrocardiogram (ECG).  You will lie down on an exam table.  Pictures of your heart will be taken for about 15-20 minutes.  You may also have a stress test. For this test, one of these things may be done: ? You will be asked to exercise on a treadmill or a stationary bike. ? You will be given medicines that will make your heart work harder. This is done if you are unable to exercise.  When blood flow to your heart has peaked, a tracer will again be given through the IV tube.   After 20-40 minutes, you will get back on the exam table. More pictures will be taken of your heart.  Depending on the tracer that is used, more pictures may need to be taken 3-4 hours later.  Your IV tube will be removed when the test is over. The test may vary among doctors and hospitals. What happens after the test?  Ask your doctor: ? Whether you can return to your normal schedule, including diet, activities, and medicines. ? Whether you should drink more fluids. This will help to remove the tracer from your body. Drink enough fluid to keep your pee (urine) pale yellow.  Ask your doctor, or the department that is doing the test: ? When will my results be ready? ? How will I get my results? Summary  A cardiac nuclear scan is a test that is done to check the flow of blood to your heart.  Tell your doctor whether you are pregnant or may be pregnant.  Before the test, ask your doctor about changing or stopping your normal medicines. This is important.  Ask your doctor whether you can return to your normal activities. You may be asked to drink more fluids. This information is not intended to replace advice given to you by your health care provider. Make sure you discuss any questions you have with your health care provider. Document Released: 11/09/2017 Document Revised: 09/15/2018 Document Reviewed: 11/09/2017 Elsevier Patient Education  [place].  Echocardiogram An echocardiogram is a procedure that uses painless sound waves (ultrasound) to produce an image of the heart. Images from an echocardiogram can provide important information about:  Signs of coronary artery disease (CAD).  Aneurysm detection. An aneurysm is a weak or damaged part of an artery wall that bulges out from the normal force of blood pumping through the body.  Heart size and shape. Changes in the size or shape of the heart can be associated with certain conditions, including heart failure, aneurysm,  and CAD.  Heart muscle function.  Heart valve function.  Signs of a past heart attack.  Fluid buildup around the heart.  Thickening of the heart muscle.  A tumor or infectious growth around the heart valves. Tell a health care provider about:  Any allergies you have.  All medicines you are taking, including vitamins, herbs, eye drops, creams, and over-the-counter medicines.  Any blood disorders you have.  Any surgeries you have had.  Any medical conditions you have.  Whether you are pregnant or may be pregnant. What are the risks? Generally, this is a safe procedure. However, problems may occur, including:  Allergic reaction to dye (contrast) that may be used during the  procedure. What happens before the procedure? No specific preparation is needed. You may eat and drink normally. What happens during the procedure?   An IV tube may be inserted into one of your veins.  You may receive contrast through this tube. A contrast is an injection that improves the quality of the pictures from your heart.  A gel will be applied to your chest.  A wand-like tool (transducer) will be moved over your chest. The gel will help to transmit the sound waves from the transducer.  The sound waves will harmlessly bounce off of your heart to allow the heart images to be captured in real-time motion. The images will be recorded on a computer. The procedure may vary among health care providers and hospitals. What happens after the procedure?  You may return to your normal, everyday life, including diet, activities, and medicines, unless your health care provider tells you not to do that. Summary  An echocardiogram is a procedure that uses painless sound waves (ultrasound) to produce an image of the heart.  Images from an echocardiogram can provide important information about the size and shape of your heart, heart muscle function, heart valve function, and fluid buildup around your heart.   You do not need to do anything to prepare before this procedure. You may eat and drink normally.  After the echocardiogram is completed, you may return to your normal, everyday life, unless your health care provider tells you not to do that. This information is not intended to replace advice given to you by your health care provider. Make sure you discuss any questions you have with your health care provider. Document Released: 05/23/2000 Document Revised: 09/16/2018 Document Reviewed: 06/28/2016 Elsevier Patient Education  2020 [employer].

## 2019-04-04 NOTE — Progress Notes (Signed)
Cardiology Office Note:    Date:  04/04/2019   ID:  Tiffany [name], DOB [birth year], MRN [id number]  PCP:  Debbrah Alar, NP  Cardiologist:  Jenean Lindau, MD   Referring MD: Debbrah Alar, NP    ASSESSMENT:    1. Essential hypertension   2. Chest discomfort   3. Palpitations    PLAN:    In order of problems listed above:  1. Chest discomfort: She has risk factors for coronary artery disease and therefore she will have a Lexiscan sestamibi.  She is agreeable to this.  In the interim if she has any significant symptoms she knows to go to the nearest emergency room.  Chest discomfort symptoms are atypical for coronary etiology however in view of risk factors I would like to evaluate her well. 2. Essential hypertension: Blood pressure stable 3. Palpitations: She has had blood work and I reviewed it.  Also she would do a 2-week ZIO monitoring.  She is agreeable for this.  In the future I told her that we could consider calcium scoring when I see her in follow-up appointment in 2 months or earlier if she has any concerns.  Weight reduction was stressed.  Risks of obesity explained and she vocalized understanding.  She brought blood work from her workplace and I reviewed it extensively and advised her of this.   Medication Adjustments/Labs and Tests Ordered: Current medicines are reviewed at length with the patient today.  Concerns regarding medicines are outlined above.  No orders of the defined types were placed in this encounter.  No orders of the defined types were placed in this encounter.    No chief complaint on file.    History of Present Illness:    Tiffany [name] is a 53 y.o. female.  Patient has history of essential hypertension.  She mentions to me that she occasionally she will have chest discomfort.  No orthopnea or PND.  She takes care of activities of daily living.  She leads a sedentary lifestyle.  The chest discomfort issues do not  radiate to the neck or to the arms.  Sexual activity do not reproduce the symptoms.  She complains of palpitations on and off.  EKG has revealed PVCs.  At the time of my evaluation, the patient is alert awake oriented and in no distress.  Past Medical History:  Diagnosis Date  . Hypertension   . Microscopic hematuria 09/25/2016   Had complete work up with Dr. Wendy Poet.   Cysto unrevealing,  CT scan nl   . Non Hodgkin's lymphoma ([place]) 2/08   stage III  . Obesity   . Seizure disorder [facility])     Past Surgical History:  Procedure Laterality Date  . [place], 2002  . PORT-A-CATH REMOVAL  2009  . PORTACATH PLACEMENT  2008    Current Medications: Current Meds  Medication Sig  . amLODipine (NORVASC) 10 MG tablet Take 1 tablet (10 mg total) by mouth daily.  [name] lamoTRIgine (LAMICTAL) 100 MG tablet Take 1 tablet (100 mg total) by mouth 2 (two) times daily.  . Multiple Vitamins-Minerals (MULTIVITAMIN WITH MINERALS) tablet Take 1 tablet by mouth daily.     Allergies:   Patient has no known allergies.   Social History   Socioeconomic History  . Marital status: Married    Spouse name: Tiffany [name]  . Number of children: 2  . Years of education: college  . Highest education level: Not on file  Occupational History  . Occupation: Charity fundraiser  Employer: [employer]  Social Needs  . Financial resource strain: Not on file  . Food insecurity    Worry: Not on file    Inability: Not on file  . Transportation needs    Medical: Not on file    Non-medical: Not on file  Tobacco Use  . Smoking status: Never Smoker  . Smokeless tobacco: Never Used  Substance and Sexual Activity  . Alcohol use: No  . Drug use: No  . Sexual activity: Yes    Partners: Male  Lifestyle  . Physical activity    Days per week: Not on file    Minutes per session: Not on file  . Stress: Not on file  Relationships  . Social Herbalist on phone: Not on file    Gets together: Not on  file    Attends religious service: Not on file    Active member of club or organization: Not on file    Attends meetings of clubs or organizations: Not on file    Relationship status: Not on file  Other Topics Concern  . Not on file  Social History Narrative   2 children   Married   Works as a Charity fundraiser   enjoys shopping, movies     Family History: The patient's family history includes Breast cancer in her mother; Hypertension in her father, maternal grandmother, mother, and paternal grandmother. There is no history of Colon cancer, Colon polyps, Esophageal cancer, Stomach cancer, or Rectal cancer.  ROS:   Please see the history of present illness.    All other systems reviewed and are negative.  EKGs/Labs/Other Studies Reviewed:    The following studies were reviewed today: I reviewed EKG and reveals sinus rhythm and nonspecific ST-T changes with frequent PVCs   Recent Labs: 01/04/2019: ALT 17; BUN 15; Creatinine, Ser 0.84; Hemoglobin 11.8; Magnesium 2.0; Platelets 413; Potassium 3.8; Sodium 137; TSH 0.893  Recent Lipid Panel    Component Value Date/Time   CHOL 178 09/22/2016 1318   TRIG 109 09/22/2016 1318   HDL 57 09/22/2016 1318   CHOLHDL 3.1 09/22/2016 1318   VLDL 22 09/22/2016 1318   LDLCALC 99 09/22/2016 1318    Physical Exam:    VS:  BP 134/88 (BP Location: Left Arm, Patient Position: Sitting, Cuff Size: Normal)   Pulse (!) 104   Ht 4\' 11"  (1.499 m)   Wt 176 lb (79.8 kg)   SpO2 100%   BMI 35.55 kg/m     Wt Readings from Last 3 Encounters:  04/04/19 176 lb (79.8 kg)  01/04/19 174 lb (78.9 kg)  08/19/18 173 lb 6.4 oz (78.7 kg)     GEN: Patient is in no acute distress HEENT: Normal NECK: No JVD; No carotid bruits LYMPHATICS: No lymphadenopathy CARDIAC: Hear sounds regular, 2/6 systolic murmur at the apex. RESPIRATORY:  Clear to auscultation without rales, wheezing or rhonchi  ABDOMEN: Soft, non-tender, non-distended MUSCULOSKELETAL:  No edema; No  deformity  SKIN: Warm and dry NEUROLOGIC:  Alert and oriented x 3 PSYCHIATRIC:  Normal affect   Signed, Jenean Lindau, MD  04/04/2019 10:36 AM    [place]

## 2019-04-06 ENCOUNTER — Ambulatory Visit (INDEPENDENT_AMBULATORY_CARE_PROVIDER_SITE_OTHER): Payer: 59

## 2019-04-06 ENCOUNTER — Other Ambulatory Visit (HOSPITAL_BASED_OUTPATIENT_CLINIC_OR_DEPARTMENT_OTHER): Payer: 59

## 2019-04-06 DIAGNOSIS — R0789 Other chest pain: Secondary | ICD-10-CM

## 2019-04-06 DIAGNOSIS — R002 Palpitations: Secondary | ICD-10-CM | POA: Diagnosis not present

## 2019-04-06 DIAGNOSIS — I10 Essential (primary) hypertension: Secondary | ICD-10-CM

## 2019-04-08 ENCOUNTER — Ambulatory Visit (HOSPITAL_BASED_OUTPATIENT_CLINIC_OR_DEPARTMENT_OTHER): Payer: 59

## 2019-04-13 ENCOUNTER — Encounter (HOSPITAL_COMMUNITY): Payer: Self-pay | Admitting: *Deleted

## 2019-04-13 ENCOUNTER — Telehealth (HOSPITAL_COMMUNITY): Payer: Self-pay | Admitting: *Deleted

## 2019-04-13 NOTE — Telephone Encounter (Signed)
Left message on voicemail in reference to upcoming appointment scheduled for 04/20/19. Phone number given for a call back so details instructions can be given. Tiffany [name]

## 2019-04-19 ENCOUNTER — Telehealth: Payer: Self-pay | Admitting: *Deleted

## 2019-04-19 ENCOUNTER — Telehealth (HOSPITAL_COMMUNITY): Payer: Self-pay | Admitting: *Deleted

## 2019-04-19 NOTE — Telephone Encounter (Signed)
Left message on voicemail in reference to upcoming appointment scheduled for 04/20/19. Phone number given for a call back so details instructions can be given.  Tiffany [name]

## 2019-04-19 NOTE — Telephone Encounter (Signed)
Left message on voicemail in reference to upcoming appointment scheduled for 04/20/2019. Phone number given for a call back so details instructions can be given. Axton Cihlar, Ranae Palms

## 2019-04-19 NOTE — Telephone Encounter (Signed)
Patient given detailed instructions per Myocardial Perfusion Study Information Sheet for the test on 04/20/2019 at 0800. Patient notified to arrive 15 minutes early and that it is imperative to arrive on time for appointment to keep from having the test rescheduled.  If you need to cancel or reschedule your appointment, please call the office within 24 hours of your appointment. . Patient verbalized understanding.Tiffany [name], Tiffany [name]

## 2019-04-20 ENCOUNTER — Ambulatory Visit (HOSPITAL_BASED_OUTPATIENT_CLINIC_OR_DEPARTMENT_OTHER)
Admission: RE | Admit: 2019-04-20 | Discharge: 2019-04-20 | Disposition: A | Discharge status: Home / Self Care | Payer: 59 | Source: Ambulatory Visit | Attending: Cardiology | Admitting: Cardiology

## 2019-04-20 ENCOUNTER — Ambulatory Visit (INDEPENDENT_AMBULATORY_CARE_PROVIDER_SITE_OTHER): Payer: 59

## 2019-04-20 ENCOUNTER — Other Ambulatory Visit: Payer: Self-pay

## 2019-04-20 DIAGNOSIS — R002 Palpitations: Secondary | ICD-10-CM

## 2019-04-20 DIAGNOSIS — R079 Chest pain, unspecified: Secondary | ICD-10-CM

## 2019-04-20 DIAGNOSIS — I10 Essential (primary) hypertension: Secondary | ICD-10-CM | POA: Diagnosis present

## 2019-04-20 DIAGNOSIS — R0789 Other chest pain: Secondary | ICD-10-CM | POA: Diagnosis present

## 2019-04-20 LAB — MYOCARDIAL PERFUSION IMAGING
LV dias vol: 84 mL (ref 46–106)
LV sys vol: 36 mL
Peak HR: 120 {beats}/min
Rest HR: 87 {beats}/min
SDS: 7
SRS: 6
SSS: 13
TID: 0.98

## 2019-04-20 LAB — ECHOCARDIOGRAM COMPLETE
Height: 59 in
Weight: 2816 oz

## 2019-04-20 MED ORDER — TECHNETIUM TC 99M TETROFOSMIN IV KIT
11.0000 | PACK | Freq: Once | INTRAVENOUS | Status: AC | PRN
  Administered 2019-04-20: 11 via INTRAVENOUS

## 2019-04-20 MED ORDER — ADENOSINE (DIAGNOSTIC) 3 MG/ML IV SOLN
0.5600 mg/kg | Freq: Once | INTRAVENOUS | Status: AC
  Administered 2019-04-20: 44.7 mg via INTRAVENOUS

## 2019-04-20 MED ORDER — TECHNETIUM TC 99M TETROFOSMIN IV KIT
31.1000 | PACK | Freq: Once | INTRAVENOUS | Status: AC | PRN
  Administered 2019-04-20: 31.1 via INTRAVENOUS

## 2019-04-20 NOTE — Progress Notes (Signed)
  Echocardiogram 2D Echocardiogram has been performed.  Cardell Peach 04/20/2019, 1:42 PM

## 2019-04-21 ENCOUNTER — Telehealth: Payer: Self-pay

## 2019-04-21 ENCOUNTER — Telehealth: Payer: Self-pay | Admitting: Cardiology

## 2019-04-21 MED ORDER — NITROGLYCERIN 0.4 MG SL SUBL: 0.4000 mg | SUBLINGUAL_TABLET | SUBLINGUAL | 3 refills | Status: DC | PRN

## 2019-04-21 MED ORDER — ASPIRIN EC 81 MG PO TBEC: 81.0000 mg | DELAYED_RELEASE_TABLET | Freq: Every day | ORAL | 3 refills | Status: DC

## 2019-04-21 NOTE — Telephone Encounter (Signed)
Left message for patient to call back for results.  

## 2019-04-21 NOTE — Telephone Encounter (Signed)
-----   Message from Jenean Lindau, MD sent at 04/20/2019  4:38 PM EST ----- She needs appointment to discuss this.  Please make sure she has aspirin and nitroglycerin. Jenean Lindau, MD 04/20/2019 4:37 PM

## 2019-04-21 NOTE — Telephone Encounter (Signed)
Returning call about results

## 2019-04-22 NOTE — Telephone Encounter (Signed)
Results relayed patient scheduled for f/u appt in [place]

## 2019-04-26 ENCOUNTER — Ambulatory Visit (INDEPENDENT_AMBULATORY_CARE_PROVIDER_SITE_OTHER): Payer: 59 | Admitting: Cardiology

## 2019-04-26 ENCOUNTER — Encounter: Payer: Self-pay | Admitting: Cardiology

## 2019-04-26 ENCOUNTER — Other Ambulatory Visit: Payer: Self-pay

## 2019-04-26 VITALS — BP 116/80 | HR 102 | Ht 59.0 in | Wt 177.0 lb

## 2019-04-26 DIAGNOSIS — R0789 Other chest pain: Secondary | ICD-10-CM | POA: Diagnosis not present

## 2019-04-26 DIAGNOSIS — I10 Essential (primary) hypertension: Secondary | ICD-10-CM

## 2019-04-26 DIAGNOSIS — R002 Palpitations: Secondary | ICD-10-CM | POA: Diagnosis not present

## 2019-04-26 DIAGNOSIS — R9439 Abnormal result of other cardiovascular function study: Secondary | ICD-10-CM | POA: Diagnosis not present

## 2019-04-26 DIAGNOSIS — R079 Chest pain, unspecified: Secondary | ICD-10-CM

## 2019-04-26 MED ORDER — METOPROLOL TARTRATE 50 MG PO TABS: 100.0000 mg | ORAL_TABLET | Freq: Once | ORAL | 0 refills | Status: DC

## 2019-04-26 NOTE — Addendum Note (Signed)
Addended by: Beckey Rutter on: 04/26/2019 02:09 PM   Modules accepted: Orders

## 2019-04-26 NOTE — Progress Notes (Signed)
Cardiology Office Note:    Date:  04/26/2019   ID:  Tiffany [name], DOB [birth year], MRN [id number]  PCP:  Tiffany Alar, NP  Cardiologist:  Jenean Lindau, MD   Referring MD: Tiffany Alar, NP    ASSESSMENT:    1. Abnormal nuclear stress test   2. Essential hypertension   3. Palpitations   4. Chest discomfort    PLAN:    In order of problems listed above:  1. Chest discomfort and abnormal nuclear stress test: I discussed my findings with the patient at extensive length.  She has multiple risk for coronary artery disease.  Discussed CT coronary angiography and conventional coronary angiography.  She prefers the former.  Benefits and potential risks were explained and she vocalized understanding. 2. Essential hypertension: Blood pressure is stable 3. Obesity: I discussed my findings with the patient and told her about diet to lose weight. 4. Further recommendations will be made based on the findings of the CT coronary angiography. 5. Patient will be seen in follow-up appointment in 6 months or earlier if the patient has any concerns    Medication Adjustments/Labs and Tests Ordered: Current medicines are reviewed at length with the patient today.  Concerns regarding medicines are outlined above.  No orders of the defined types were placed in this encounter.  No orders of the defined types were placed in this encounter.    No chief complaint on file.    History of Present Illness:    Tiffany [name] is a 53 y.o. female.  Patient has past medical history of essential hypertension, obesity.  She was evaluated for chest discomfort and palpitations and her stress test is abnormal.  The details are mentioned below.  She is here for follow-up of the symptoms.  She leads a sedentary lifestyle.  At the time of my evaluation, the patient is alert awake oriented and in no distress.  Past Medical History:  Diagnosis Date  . Hypertension   . Microscopic  hematuria 09/25/2016   Had complete work up with Dr. Wendy [name].   Cysto unrevealing,  CT scan nl   . Non Hodgkin's lymphoma ([place]) 2/08   stage III  . Obesity   . Seizure disorder Tiffany [name])     Past Surgical History:  Procedure Laterality Date  . [place], 2002  . PORT-A-CATH REMOVAL  2009  . PORTACATH PLACEMENT  2008    Current Medications: Current Meds  Medication Sig  . amLODipine (NORVASC) 10 MG tablet Take 1 tablet (10 mg total) by mouth daily.  [name] lamoTRIgine (LAMICTAL) 100 MG tablet Take 1 tablet (100 mg total) by mouth 2 (two) times daily.  . Multiple Vitamins-Minerals (MULTIVITAMIN WITH MINERALS) tablet Take 1 tablet by mouth daily.  . nitroGLYCERIN (NITROSTAT) 0.4 MG SL tablet Place 1 tablet (0.4 mg total) under the tongue every 5 (five) minutes as needed.     Allergies:   Patient has no known allergies.   Social History   Socioeconomic History  . Marital status: Married    Spouse name: Tiffany [name]  . Number of children: 2  . Years of education: college  . Highest education level: Not on file  Occupational History  . Occupation: Engineer, building services: [employer]  . Financial resource strain: Not on file  . Food insecurity    Worry: Not on file    Inability: Not on file  . Transportation needs    Medical: Not on file  Non-medical: Not on file  Tobacco Use  . Smoking status: Never Smoker  . Smokeless tobacco: Never Used  Substance and Sexual Activity  . Alcohol use: No  . Drug use: No  . Sexual activity: Yes    Partners: Male  Lifestyle  . Physical activity    Days per week: Not on file    Minutes per session: Not on file  . Stress: Not on file  Relationships  . Social Herbalist on phone: Not on file    Gets together: Not on file    Attends religious service: Not on file    Active member of club or organization: Not on file    Attends meetings of clubs or organizations: Not on file    Relationship  status: Not on file  Other Topics Concern  . Not on file  Social History Narrative   2 children   Married   Works as a Charity fundraiser   enjoys shopping, movies     Family History: The patient's family history includes Breast cancer in her mother; Hypertension in her father, maternal grandmother, mother, and paternal grandmother. There is no history of Colon cancer, Colon polyps, Esophageal cancer, Stomach cancer, or Rectal cancer.  ROS:   Please see the history of present illness.    All other systems reviewed and are negative.  EKGs/Labs/Other Studies Reviewed:    The following studies were reviewed today: Study Highlights   The left ventricular ejection fraction is normal (55-65%).  Nuclear stress EF: 57%.  There was no ST segment deviation noted during stress.  Defect 1: There is a medium defect of moderate severity present in the basal inferoseptal, basal inferior, mid inferoseptal, mid inferior and apical inferior location.  Findings consistent with ischemia.  This is an intermediate risk study.  Ischemia involvind basal, mid and apical portion of the inferior and infero-septal wall.  Normal EF.    IMPRESSIONS    1. Left ventricular ejection fraction, by visual estimation, is 60 to 65%. The left ventricle has normal function. Left ventricular septal wall thickness was mildly increased. Mildly increased left ventricular posterior wall thickness. There is mildly  increased left ventricular hypertrophy.  2. Left ventricular diastolic parameters are consistent with Grade I diastolic dysfunction (impaired relaxation).  3. The left ventricle has no regional wall motion abnormalities.  4. Global right ventricle has normal systolic function.The right ventricular size is normal. No increase in right ventricular wall thickness.  5. Left atrial size was normal.  6. Right atrial size was normal.  7. The mitral valve is normal in structure. No evidence of mitral valve  regurgitation. No evidence of mitral stenosis.  8. The tricuspid valve is normal in structure. Tricuspid valve regurgitation is trivial.  9. The aortic valve is tricuspid. Aortic valve regurgitation is not visualized. No evidence of aortic valve sclerosis or stenosis. 10. The pulmonic valve was normal in structure. Pulmonic valve regurgitation is not visualized. 11. Normal pulmonary artery systolic pressure. 12. The inferior vena cava is normal in size with greater than 50% respiratory variability, suggesting right atrial pressure of 3 mmHg.   Recent Labs: 01/04/2019: ALT 17; BUN 15; Creatinine, Ser 0.84; Hemoglobin 11.8; Magnesium 2.0; Platelets 413; Potassium 3.8; Sodium 137; TSH 0.893  Recent Lipid Panel    Component Value Date/Time   CHOL 178 09/22/2016 1318   TRIG 109 09/22/2016 1318   HDL 57 09/22/2016 1318   CHOLHDL 3.1 09/22/2016 1318   VLDL 22 09/22/2016 1318  [place] 99 09/22/2016 1318    Physical Exam:    VS:  BP 116/80   Pulse (!) 102   Ht 4\' 11"  (1.499 m)   Wt 177 lb (80.3 kg)   SpO2 98%   BMI 35.75 kg/m     Wt Readings from Last 3 Encounters:  04/26/19 177 lb (80.3 kg)  04/20/19 176 lb (79.8 kg)  04/04/19 176 lb (79.8 kg)     GEN: Patient is in no acute distress HEENT: Normal NECK: No JVD; No carotid bruits LYMPHATICS: No lymphadenopathy CARDIAC: Hear sounds regular, 2/6 systolic murmur at the apex. RESPIRATORY:  Clear to auscultation without rales, wheezing or rhonchi  ABDOMEN: Soft, non-tender, non-distended MUSCULOSKELETAL:  No edema; No deformity  SKIN: Warm and dry NEUROLOGIC:  Alert and oriented x 3 PSYCHIATRIC:  Normal affect   Signed, Jenean Lindau, MD  04/26/2019 1:47 PM    [facility]

## 2019-04-26 NOTE — Patient Instructions (Addendum)
Medication Instructions:  Your physician recommends that you continue on your current medications as directed. Please refer to the Current Medication list given to you today.  If you need a refill on your cardiac medications before your next appointment, please call your pharmacy.   Lab work: You will need to come in 7 days prior to procedure to have a BMP drawn.  If you have labs (blood work) drawn today and your tests are completely normal, you will receive your results only by:  [place] (if you have MyChart) OR  A paper copy in the mail If you have any lab test that is abnormal or we need to change your treatment, we will call you to review the results.  Testing/Procedures: You had an EKG performed today   Non-Cardiac CT scanning, (CAT scanning), is a noninvasive, special x-ray that produces cross-sectional images of the body using x-rays and a computer. CT scans help physicians diagnose and treat medical conditions. For some CT exams, a contrast material is used to enhance visibility in the area of the body being studied. CT scans provide greater clarity and reveal more details than regular x-ray exams.  Please arrive at the [facility] main entrance of [facility] at xx:xx AM (30-45 minutes prior to test start time)  [facility] [place], [place] [address] [phone]  Proceed to the [facility] Radiology Department (First Floor).  Please follow these instructions carefully (unless otherwise directed):  On the Night Before the Test:  Be sure to Drink plenty of water.  Do not consume any caffeinated/decaffeinated beverages or chocolate 12 hours prior to your test.  Do not take any antihistamines 12 hours prior to your test.  On the Day of the Test:  Drink plenty of water. Do not drink any water within one hour of the test.  Do not eat any food 4 hours prior to the test.  You may take your regular medications prior to the  test.   Take metoprolol (Lopressor) two hours prior to test.       After the Test:  Drink plenty of water.  After receiving IV contrast, you may experience a mild flushed feeling. This is normal.  On occasion, you may experience a mild rash up to 24 hours after the test. This is not dangerous. If this occurs, you can take Benadryl 25 mg and increase your fluid intake.  If you experience trouble breathing, this can be serious. If it is severe call 911 IMMEDIATELY. If it is mild, please call our office.   Follow-Up: At [facility], you and your health needs are our priority.  As part of our continuing mission to provide you with exceptional heart care, we have created designated Provider Care Teams.  These Care Teams include your primary Cardiologist (physician) and Advanced Practice Providers (APPs -  Physician Assistants and Nurse Practitioners) who all work together to provide you with the care you need, when you need it. You will need a follow up appointment in 6 months.    Any Other Special Instructions Will Be Listed Below  Metoprolol tablets What is this medicine? METOPROLOL (me TOE proe lole) is a beta-blocker. Beta-blockers reduce the workload on the heart and help it to beat more regularly. This medicine is used to treat high blood pressure and to prevent chest pain. It is also used to after a heart attack and to prevent an additional heart attack from occurring. This medicine may be used  for other purposes; ask your health care provider or pharmacist if you have questions. COMMON BRAND NAME(S): Lopressor What should I tell my health care provider before I take this medicine? They need to know if you have any of these conditions: -diabetes -heart or vessel disease like slow heart rate, worsening heart failure, heart block, sick sinus syndrome or Raynaud's disease -kidney disease -liver disease -lung or breathing disease, like asthma or emphysema -pheochromocytoma -thyroid  disease -an unusual or allergic reaction to metoprolol, other beta-blockers, medicines, foods, dyes, or preservatives -pregnant or trying to get pregnant -breast-feeding How should I use this medicine? Take this medicine by mouth with a drink of water. Follow the directions on the prescription label. Take this medicine immediately after meals. Take your doses at regular intervals. Do not take more medicine than directed. Do not stop taking this medicine suddenly. This could lead to serious heart-related effects. Talk to your pediatrician regarding the use of this medicine in children. Special care may be needed. Overdosage: If you think you have taken too much of this medicine contact a poison control center or emergency room at once. NOTE: This medicine is only for you. Do not share this medicine with others. What if I miss a dose? If you miss a dose, take it as soon as you can. If it is almost time for your next dose, take only that dose. Do not take double or extra doses. What may interact with this medicine? This medicine may interact with the following medications: -certain medicines for blood pressure, heart disease, irregular heart beat -certain medicines for depression like monoamine oxidase ([name]) inhibitors, fluoxetine, or paroxetine -clonidine -dobutamine -epinephrine -isoproterenol -reserpine This list may not describe all possible interactions. Give your health care provider a list of all the medicines, herbs, non-prescription drugs, or dietary supplements you use. Also tell them if you smoke, drink alcohol, or use illegal drugs. Some items may interact with your medicine. What should I watch for while using this medicine? Visit your doctor or health care professional for regular check ups. Contact your doctor right away if your symptoms worsen. Check your blood pressure and pulse rate regularly. Ask your health care professional what your blood pressure and pulse rate should be,  and when you should contact them. You may get drowsy or dizzy. Do not drive, use machinery, or do anything that needs mental alertness until you know how this medicine affects you. Do not sit or stand up quickly, especially if you are an older patient. This reduces the risk of dizzy or fainting spells. Contact your doctor if these symptoms continue. Alcohol may interfere with the effect of this medicine. Avoid alcoholic drinks. What side effects may I notice from receiving this medicine? Side effects that you should report to your doctor or health care professional as soon as possible: -allergic reactions like skin rash, itching or hives -cold or numb hands or feet -depression -difficulty breathing -faint -fever with sore throat -irregular heartbeat, chest pain -rapid weight gain -swollen legs or ankles Side effects that usually do not require medical attention (report to your doctor or health care professional if they continue or are bothersome): -anxiety or nervousness -change in sex drive or performance -dry skin -headache -nightmares or trouble sleeping -short term memory loss -stomach upset or diarrhea -unusually tired This list may not describe all possible side effects. Call your doctor for medical advice about side effects. You may report side effects to FDA at 1-800-FDA-1088. Where should I keep my  medicine? Keep out of the reach of children. Store at room temperature between 15 and 30 degrees C (59 and 86 degrees F). Throw away any unused medicine after the expiration date. NOTE: This sheet is a summary. It may not cover all possible information. If you have questions about this medicine, talk to your doctor, pharmacist, or health care provider.  2019 Elsevier/Gold Standard (2013-01-28 14:40:36)   Coronary Angiogram A coronary angiogram is an X-ray procedure that is used to examine the arteries in the heart. In this procedure, a dye (contrast dye) is injected through a long,  thin tube (catheter). The catheter is inserted through the groin, wrist, or arm. The dye is injected into each artery, then X-rays are taken to show if there is a blockage in the arteries of the heart. This procedure can also show if you have valve disease or a disease of the aorta, and it can be used to check the overall function of your heart muscle. You may have a coronary angiogram if:  You are having chest pain, or other symptoms of angina, and you are at risk for heart disease.  You have an abnormal electrocardiogram (ECG) or stress test.  You have chest pain and heart failure.  You are having irregular heart rhythms.  You and your health care provider determine that the benefits of the test information outweigh the risks of the procedure. Let your health care provider know about:  Any allergies you have, including allergies to contrast dye.  All medicines you are taking, including vitamins, herbs, eye drops, creams, and over-the-counter medicines.  Any problems you or family members have had with anesthetic medicines.  Any blood disorders you have.  Any surgeries you have had.  History of kidney problems or kidney failure.  Any medical conditions you have.  Whether you are pregnant or may be pregnant. What are the risks? Generally, this is a safe procedure. However, problems may occur, including:  Infection.  Allergic reaction to medicines or dyes that are used.  Bleeding from the access site or other locations.  Kidney injury, especially in people with impaired kidney function.  Stroke (rare).  Heart attack (rare).  Damage to other structures or organs. What happens before the procedure? Staying hydrated Follow instructions from your health care provider about hydration, which may include:  Up to 2 hours before the procedure - you may continue to drink clear liquids, such as water, clear fruit juice, black coffee, and plain tea. Eating and drinking  restrictions Follow instructions from your health care provider about eating and drinking, which may include:  8 hours before the procedure - stop eating heavy meals or foods such as meat, fried foods, or fatty foods.  6 hours before the procedure - stop eating light meals or foods, such as toast or cereal.  2 hours before the procedure - stop drinking clear liquids. General instructions  Ask your health care provider about: ? Changing or stopping your regular medicines. This is especially important if you are taking diabetes medicines or blood thinners. ? Taking medicines such as ibuprofen. These medicines can thin your blood. Do not take these medicines before your procedure if your health care provider instructs you not to, though aspirin may be recommended prior to coronary angiograms.  Plan to have someone take you home from the hospital or clinic.  You may need to have blood tests or X-rays done. What happens during the procedure?  An IV tube will be inserted into one of  your veins.  You will be given one or more of the following: ? A medicine to help you relax (sedative). ? A medicine to numb the area where the catheter will be inserted into an artery (local anesthetic).  To reduce your risk of infection: ? Your health care team will wash or sanitize their hands. ? Your skin will be washed with soap. ? Hair may be removed from the area where the catheter will be inserted.  You will be connected to a continuous ECG monitor.  The catheter will be inserted into an artery. The location may be in your groin, in your wrist, or in the fold of your arm (near your elbow).  A type of X-ray (fluoroscopy) will be used to help guide the catheter to the opening of the blood vessel that is being examined.  A dye will be injected into the catheter, and X-rays will be taken. The dye will help to show where any narrowing or blockages are located in the heart arteries.  Tell your health  care provider if you have any chest pain or trouble breathing during the procedure.  If blockages are found, your health care provider may perform another procedure, such as inserting a coronary stent. The procedure may vary among health care providers and hospitals. What happens after the procedure?  After the procedure, you will need to keep the area still for a few hours, or for as long as told by your health care provider. If the procedure is done through the groin, you will be instructed to not bend and not cross your legs.  The insertion site will be checked frequently.  The pulse in your foot or wrist will be checked frequently.  You may have additional blood tests, X-rays, and a test that records the electrical activity of your heart (ECG).  Do not drive for 24 hours if you were given a sedative. Summary  A coronary angiogram is an X-ray procedure that is used to look into the arteries in the heart.  During the procedure, a dye (contrast dye) is injected through a long, thin tube (catheter). The catheter is inserted through the groin, wrist, or arm.  Tell your health care provider about any allergies you have, including allergies to contrast dye.  After the procedure, you will need to keep the area still for a few hours, or for as long as told by your health care provider. This information is not intended to replace advice given to you by your health care provider. Make sure you discuss any questions you have with your health care provider. Document Released: 11/30/2002 Document Revised: 03/07/2016 Document Reviewed: 03/07/2016 Elsevier Interactive Patient Education  2019 [employer].

## 2019-04-29 ENCOUNTER — Ambulatory Visit: Payer: 59 | Admitting: Cardiology

## 2019-05-09 ENCOUNTER — Telehealth: Payer: Self-pay | Admitting: Cardiology

## 2019-05-09 NOTE — Telephone Encounter (Signed)
Patient has not heard about her CT being scheduled yet

## 2019-05-20 NOTE — Telephone Encounter (Signed)
CT has been scheduled for 06/09/19. Left patient voicemail of reminder of date/time of appointment.   Loel Dubonnet, NP

## 2019-05-23 ENCOUNTER — Ambulatory Visit: Payer: 59 | Admitting: Cardiology

## 2019-05-30 ENCOUNTER — Telehealth: Payer: Self-pay

## 2019-05-30 NOTE — Telephone Encounter (Signed)
Left message that monitor results were ok, copy sent to Dr. Inda Castle.

## 2019-05-30 NOTE — Telephone Encounter (Signed)
-----   Message from Jenean Lindau, MD sent at 05/13/2019 11:11 AM EST ----- The results of the study is unremarkable. Please inform patient. I will discuss in detail at next appointment. Cc  primary care/referring physician Jenean Lindau, MD 05/13/2019 11:11 AM

## 2019-06-08 ENCOUNTER — Telehealth (HOSPITAL_COMMUNITY): Payer: Self-pay | Admitting: Emergency Medicine

## 2019-06-08 ENCOUNTER — Encounter (HOSPITAL_COMMUNITY): Payer: Self-pay

## 2019-06-08 NOTE — Telephone Encounter (Signed)
Left message on voicemail with name and callback number Jesicca Dipierro RN Navigator Cardiac Imaging [facility] [phone] Office [phone] Cell  

## 2019-06-09 ENCOUNTER — Telehealth: Payer: Self-pay

## 2019-06-09 ENCOUNTER — Encounter (HOSPITAL_COMMUNITY): Payer: Self-pay

## 2019-06-09 ENCOUNTER — Ambulatory Visit (HOSPITAL_COMMUNITY)
Admission: RE | Admit: 2019-06-09 | Discharge: 2019-06-09 | Disposition: A | Discharge status: Home / Self Care | Payer: Managed Care, Other (non HMO) | Source: Ambulatory Visit | Attending: Cardiology | Admitting: Cardiology

## 2019-06-09 ENCOUNTER — Encounter: Payer: Managed Care, Other (non HMO) | Admitting: *Deleted

## 2019-06-09 ENCOUNTER — Other Ambulatory Visit: Payer: Self-pay

## 2019-06-09 ENCOUNTER — Telehealth (HOSPITAL_COMMUNITY): Payer: Self-pay | Admitting: Emergency Medicine

## 2019-06-09 DIAGNOSIS — R079 Chest pain, unspecified: Secondary | ICD-10-CM

## 2019-06-09 DIAGNOSIS — Z006 Encounter for examination for normal comparison and control in clinical research program: Secondary | ICD-10-CM

## 2019-06-09 MED ORDER — METOPROLOL TARTRATE 5 MG/5ML IV SOLN
INTRAVENOUS | Status: AC
  Administered 2019-06-09: 5 mg via INTRAVENOUS
  Filled 2019-06-09: qty 10

## 2019-06-09 MED ORDER — METOPROLOL TARTRATE 5 MG/5ML IV SOLN
5.0000 mg | INTRAVENOUS | Status: AC | PRN
  Administered 2019-06-09: 5 mg via INTRAVENOUS

## 2019-06-09 MED ORDER — DILTIAZEM HCL 25 MG/5ML IV SOLN
5.0000 mg | INTRAVENOUS | Status: DC | PRN
  Administered 2019-06-09: 5 mg via INTRAVENOUS
  Filled 2019-06-09 (×2): qty 5

## 2019-06-09 MED ORDER — DILTIAZEM HCL 25 MG/5ML IV SOLN
INTRAVENOUS | Status: AC
  Administered 2019-06-09: 5 mg via INTRAVENOUS
  Filled 2019-06-09: qty 5

## 2019-06-09 NOTE — Telephone Encounter (Signed)
Someone sent the pt to me but she do not have a ppm, icd, or loop. I tried to help her get to the right department. I connected her to Marchia Bond.

## 2019-06-09 NOTE — Progress Notes (Signed)
Pt's HR remained in the 70's despite giving IV medications.  Dr. Margaretann Loveless made aware and decided to cancel scan for today.  Per Dr. Margaretann Loveless, pt may be rescheduled if her HR can be lowered to less than 65bpm.

## 2019-06-09 NOTE — Telephone Encounter (Signed)
Pt calling to clarify medication instructions for upcoming cardiac CT this afternoon. All questions answered, appreciated call  Marchia Bond RN Navigator Cardiac Imaging [facility] [phone] Office  [phone] Cell

## 2019-06-09 NOTE — Research (Signed)
CADFEM Informed Consent                  Subject Name:   Tiffany [name]   Subject met inclusion and exclusion criteria.  The informed consent form, study requirements and expectations were reviewed with the subject and questions and concerns were addressed prior to the signing of the consent form.  The subject verbalized understanding of the trial requirements.  The subject agreed to participate in the CADFEM trial and signed the informed consent.  The informed consent was obtained prior to performance of any protocol-specific procedures for the subject.  A copy of the signed informed consent was given to the subject and a copy was placed in the subject's medical record.   [place] Shakora Nordquist, Research Assistant  06/09/2019   14::30 p.m.

## 2019-06-13 ENCOUNTER — Telehealth: Payer: Self-pay | Admitting: Cardiology

## 2019-06-13 NOTE — Telephone Encounter (Signed)
Please check on rescheduling of CT. Pt was unable to complete due to racing heart. Please advise

## 2019-06-17 ENCOUNTER — Ambulatory Visit: Payer: Managed Care, Other (non HMO) | Attending: Internal Medicine

## 2019-06-17 DIAGNOSIS — Z20822 Contact with and (suspected) exposure to covid-19: Secondary | ICD-10-CM

## 2019-06-17 NOTE — Telephone Encounter (Signed)
Get me recent vitals

## 2019-06-17 NOTE — Telephone Encounter (Signed)
Left message for patient to call office with most recent vitals to have CT rescheduled.

## 2019-06-19 LAB — NOVEL CORONAVIRUS, NAA: SARS-CoV-2, NAA: NOT DETECTED

## 2019-06-21 NOTE — Telephone Encounter (Signed)
Patient would like to know when she can have CT done. Please advise

## 2019-06-22 ENCOUNTER — Telehealth: Payer: Self-pay | Admitting: Cardiology

## 2019-06-22 NOTE — Telephone Encounter (Signed)
New Message:      Pt would like to switch from Dr Geraldo Pitter service to Dr Oval Linsey service. Is this alright with both of you please?

## 2019-06-23 NOTE — Telephone Encounter (Signed)
RN spoke with patient again with attempt to get most recent vitals. Patient states she does not check BP at home often. RN asked if patient would log information for next couple nights and call office with information to aid in rescheduling her CT. Patient states she is currently transferring her care to Dr. Berneice Gandy.

## 2019-06-23 NOTE — Telephone Encounter (Signed)
Follow Up:     Pt called to see if her transfer was approved.

## 2019-06-23 NOTE — Telephone Encounter (Signed)
I think I should be out of this thread now!!

## 2019-06-28 ENCOUNTER — Telehealth: Payer: Self-pay | Admitting: Cardiovascular Disease

## 2019-06-28 NOTE — Telephone Encounter (Signed)
New Message  STAT if HR is under 50 or over 120 (normal HR is 60-100 beats per minute)  1) What is your heart rate? 99  2) Do you have a log of your heart rate readings (document readings)? 96, 116, 99, per patient always ranging between 96-166 (at rest)  3) Do you have any other symptoms? Increased HR, no other symptoms  Appointment scheduled with Dr. Oval Linsey on 07/05/2019 at 11:40 am.

## 2019-06-28 NOTE — Telephone Encounter (Signed)
Spoke with patient and this is an ongoing issue she has been having, she is changing providers. Patient will keep appointment as scheduled

## 2019-06-28 NOTE — Telephone Encounter (Signed)
Left message to call back  

## 2019-07-05 ENCOUNTER — Encounter: Payer: Self-pay | Admitting: Cardiovascular Disease

## 2019-07-05 ENCOUNTER — Other Ambulatory Visit: Payer: Self-pay

## 2019-07-05 ENCOUNTER — Ambulatory Visit (INDEPENDENT_AMBULATORY_CARE_PROVIDER_SITE_OTHER): Payer: Managed Care, Other (non HMO) | Admitting: Cardiovascular Disease

## 2019-07-05 VITALS — BP 128/78 | HR 87 | Temp 97.0°F | Ht 59.0 in | Wt 177.0 lb

## 2019-07-05 DIAGNOSIS — R0602 Shortness of breath: Secondary | ICD-10-CM | POA: Diagnosis not present

## 2019-07-05 DIAGNOSIS — R072 Precordial pain: Secondary | ICD-10-CM | POA: Diagnosis not present

## 2019-07-05 DIAGNOSIS — R9439 Abnormal result of other cardiovascular function study: Secondary | ICD-10-CM | POA: Diagnosis not present

## 2019-07-05 HISTORY — DX: Shortness of breath: R06.02

## 2019-07-05 MED ORDER — IVABRADINE HCL 7.5 MG PO TABS: 7.5000 mg | ORAL_TABLET | Freq: Two times a day (BID) | ORAL | 6 refills | Status: DC

## 2019-07-05 MED ORDER — IVABRADINE HCL 7.5 MG PO TABS: 7.5000 mg | ORAL_TABLET | Freq: Once | ORAL | 0 refills | Status: AC

## 2019-07-05 MED ORDER — METOPROLOL TARTRATE 50 MG PO TABS: 50.0000 mg | ORAL_TABLET | Freq: Two times a day (BID) | ORAL | 3 refills | Status: DC

## 2019-07-05 NOTE — Patient Instructions (Addendum)
Medication Instructions:  START- Metoprolol Tartrate 50 mg by mouth twice a day TAKE- Corlanor 7.5 mg two hours prior to procedure TAKE- Metoprolol Tartrate 100 mg two hours prior to procedure  *If you need a refill on your cardiac medications before your next appointment, please call your pharmacy*  Lab Work: BMP 1 week prior to procedure  If you have labs (blood work) drawn today and your tests are completely normal, you will receive your results only by: [name] MyChart Message (if you have MyChart) OR . A paper copy in the mail If you have any lab test that is abnormal or we need to change your treatment, we will call you to review the results.  Testing/Procedures: Non-Cardiac CT Angiography (CTA), is a special type of CT scan that uses a computer to produce multi-dimensional views of major blood vessels throughout the body. In CT angiography, a contrast material is injected through an IV to help visualize the blood vessels   Follow-Up: At [facility], you and your health needs are our priority.  As part of our continuing mission to provide you with exceptional heart care, we have created designated Provider Care Teams.  These Care Teams include your primary Cardiologist (physician) and Advanced Practice Providers (APPs -  Physician Assistants and Nurse Practitioners) who all work together to provide you with the care you need, when you need it.  Your next appointment:   6 week(s)  The format for your next appointment:   In Person  Provider:   You may see Dr Skeet Latch, or one of the following Advanced Practice Providers on your designated Care Team:    Kerin Ransom, PA-C  [place], [place]  Coletta Memos, [place]   Other Instructions Your cardiac CT will be scheduled at one of the below locations:   [facility] [address] [place], [place] [address] [phone]  [place] [address] [place], [place] [address] [phone]  If scheduled at [facility], please arrive at the [facility] main entrance of [facility] 30-45 minutes prior to test start time. Proceed to the [facility] Radiology Department (first floor) to check-in and test prep.  If scheduled at [facility], please arrive 15 mins early for check-in and test prep.  Please follow these instructions carefully (unless otherwise directed):  Hold all erectile dysfunction medications at least 3 days (72 hrs) prior to test.  On the Night Before the Test: . Be sure to Drink plenty of water. . Do not consume any caffeinated/decaffeinated beverages or chocolate 12 hours prior to your test. . Do not take any antihistamines 12 hours prior to your test.   On the Day of the Test: . Drink plenty of water. Do not drink any water within one hour of the test. . Do not eat any food 4 hours prior to the test. . You may take your regular medications prior to the test.  . Take metoprolol (Lopressor) 100 mg two hours prior to test. . HOLD Furosemide/Hydrochlorothiazide morning of the test. . FEMALES- please wear underwire-free bra if available       After the Test: . Drink plenty of water. . After receiving IV contrast, you may experience a mild flushed feeling. This is normal. . On occasion, you may experience a mild rash up to 24 hours after the test. This is not dangerous. If this occurs, you can take Benadryl 25 mg and increase  your fluid intake. . If you experience trouble breathing, this can be serious. If it is severe call 911 IMMEDIATELY. If it is mild, please call our office. . If you take any of these medications: Glipizide/Metformin, Avandament, Glucavance, please do not take 48 hours after completing test unless otherwise instructed.   Once we have confirmed authorization from your insurance company, we will call you to set up a date and time for your test.   For  non-scheduling related questions, please contact the cardiac imaging nurse navigator should you have any questions/concerns: Marchia Bond, RN Navigator Cardiac Imaging [facility] [phone] Office

## 2019-07-05 NOTE — Progress Notes (Signed)
Cardiology Office Note   Date:  07/05/2019   ID:  Tiffany [name], DOB May 09, 1966, MRN [id number]  PCP:  Debbrah Alar, NP  Cardiologist:   Skeet Latch, MD   No chief complaint on file.    History of Present Illness: Tiffany [name] is a 54 y.o. female who is being seen today for the evaluation of abnormal stress test at the request of Debbrah Alar, NP.  Tiffany [name] initially saw Dr. Geraldo Pitter due to difficulty breathing.  She noticed that she gets short of breath at times when trying to lay down.  She also feels like she cannot hold her breath as long as she used to be able to when singing.  Sometimes even when she is talking she has to stop and take a deep breath.  She denies any chest pain or pressure.  She was referred for an echocardiogram 04/20/2019 that revealed LVEF 60 to 65% with mild LVH and grade 1 diastolic dysfunction.  It was otherwise normal.   She had a Lexiscan Myoview 04/20/2019 that revealed LVEF 37% with concern for ischemia in the basal to mid inferoseptal, basal to mid inferior and apical regions.  She was referred for coronary CT-A.  However this was canceled because her heart rate was in the 70s after taking metoprolol.  She continues to have episodes of shortness of breath.  She has noticed that her heart rate is running high at home.  She purchased a blood pressure machine that shows her blood pressure runs in the 120s to 130s over 70s to 90s.  Heart rate is consistently in the 90s to 110s.  She does feel her heart racing, especially when lying down.  This has been ongoing for approximately 2 years but does seem to be a little bit worse lately.  It never occurs with exertion.  It lasts for 10-15 minutes.  She denies lightheadedness or dizziness.   Tiffany [name] does not get much exercise.  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  She drinks 4 ounces of coffee daily.  She takes a multivitamin, elderberry,  tumeric and ginger.  These were started after she started noticing the palpitations.  She does not think this contributes to her heart rate or her breathing.  She has    Past Medical History:  Diagnosis Date  . Hypertension   . Microscopic hematuria 09/25/2016   Had complete work up with Dr. Wendy Poet.   Cysto unrevealing,  CT scan nl   . Non Hodgkin's lymphoma ([place]) 2/08   stage III  . Obesity   . Seizure disorder [facility])     Past Surgical History:  Procedure Laterality Date  . [place], 2002  . PORT-A-CATH REMOVAL  2009  . PORTACATH PLACEMENT  2008     Current Outpatient Medications  Medication Sig Dispense Refill  . amLODipine (NORVASC) 10 MG tablet Take 1 tablet (10 mg total) by mouth daily. 90 tablet 1  . lamoTRIgine (LAMICTAL) 100 MG tablet Take 1 tablet (100 mg total) by mouth 2 (two) times daily. 180 tablet 3  . metoprolol tartrate (LOPRESSOR) 50 MG tablet Take 2 tablets (100 mg total) by mouth once for 1 dose. TAKE 2 tablets two hours prior to procedure if HR is 55 or greater 2 tablet 0  . Multiple Vitamins-Minerals (MULTIVITAMIN WITH MINERALS) tablet Take 1 tablet by mouth daily.    . nitroGLYCERIN (NITROSTAT) 0.4 MG SL tablet Place 1 tablet (0.4 mg  total) under the tongue every 5 (five) minutes as needed. 25 tablet 3   No current facility-administered medications for this visit.    Allergies:   Patient has no known allergies.    Social History:  The patient  reports that she has never smoked. She has never used smokeless tobacco. She reports that she does not drink alcohol or use drugs.   Family History:  The patient's family history includes Breast cancer in her mother; Hypertension in her father, maternal grandmother, mother, and paternal grandmother.    ROS:  Please see the history of present illness.   Otherwise, review of systems are positive for none.   All other systems are reviewed and negative.    PHYSICAL EXAM: VS:  BP 128/78   Pulse 87    Temp (!) 97 F (36.1 C)   Ht 4\' 11"  (1.499 m)   Wt 177 lb (80.3 kg)   SpO2 95%   BMI 35.75 kg/m  , BMI Body mass index is 35.75 kg/m. GENERAL:  Well appearing HEENT:  Pupils equal round and reactive, fundi not visualized, oral mucosa unremarkable NECK:  No jugular venous distention, waveform within normal limits, carotid upstroke brisk and symmetric, no bruits, no thyromegaly LYMPHATICS:  No cervical adenopathy LUNGS:  Clear to auscultation bilaterally HEART:  RRR PMI not displaced or sustained,S1 and S2 within normal limits, no S3, no S4, no clicks, no rubs, no murmurs ABD:  Flat, positive bowel sounds normal in frequency in pitch, no bruits, no rebound, no guarding, no midline pulsatile mass, no hepatomegaly, no splenomegaly EXT:  2 plus pulses throughout, no edema, no cyanosis no clubbing SKIN:  No rashes no nodules NEURO:  Cranial nerves II through XII grossly intact, motor grossly intact throughout PSYCH:  Cognitively intact, oriented to person place and time   EKG:  EKG is ordered today. The ekg ordered today demonstrates sinus rhythm.  Rate 87 bpm.  Echo 04/20/19:  1. Left ventricular ejection fraction, by visual estimation, is 60 to 65%. The left ventricle has normal function. Left ventricular septal wall thickness was mildly increased. Mildly increased left ventricular posterior wall thickness. There is mildly  increased left ventricular hypertrophy.  2. Left ventricular diastolic parameters are consistent with Grade I diastolic dysfunction (impaired relaxation).  3. The left ventricle has no regional wall motion abnormalities.  4. Global right ventricle has normal systolic function.The right ventricular size is normal. No increase in right ventricular wall thickness.  5. Left atrial size was normal.  6. Right atrial size was normal.  7. The mitral valve is normal in structure. No evidence of mitral valve regurgitation. No evidence of mitral stenosis.  8. The tricuspid valve  is normal in structure. Tricuspid valve regurgitation is trivial.  9. The aortic valve is tricuspid. Aortic valve regurgitation is not visualized. No evidence of aortic valve sclerosis or stenosis. 10. The pulmonic valve was normal in structure. Pulmonic valve regurgitation is not visualized. 11. Normal pulmonary artery systolic pressure. 12. The inferior vena cava is normal in size with greater than 50% respiratory variability, suggesting right atrial pressure of 3 mmHg.  Lexiscan Myoview 04/20/19:   The left ventricular ejection fraction is normal (55-65%).  Nuclear stress EF: 57%.  There was no ST segment deviation noted during stress.  Defect 1: There is a medium defect of moderate severity present in the basal inferoseptal, basal inferior, mid inferoseptal, mid inferior and apical inferior location.  Findings consistent with ischemia.  This is an intermediate risk  study.  Ischemia involvind basal, mid and apical portion of the inferior and infero-septal wall.  Normal EF.  Recent Labs: 01/04/2019: ALT 17; BUN 15; Creatinine, Ser 0.84; Hemoglobin 11.8; Magnesium 2.0; Platelets 413; Potassium 3.8; Sodium 137; TSH 0.893    Lipid Panel    Component Value Date/Time   CHOL 178 09/22/2016 1318   TRIG 109 09/22/2016 1318   HDL 57 09/22/2016 1318   CHOLHDL 3.1 09/22/2016 1318   VLDL 22 09/22/2016 1318   LDLCALC 99 09/22/2016 1318      Wt Readings from Last 3 Encounters:  07/05/19 177 lb (80.3 kg)  04/26/19 177 lb (80.3 kg)  04/20/19 176 lb (79.8 kg)      ASSESSMENT AND PLAN:  # Shortness of breath: # Abnormal stress test: Tiffany [name]'s shortness of breath is not exertional.  She did have an abnormal stress test.  However I am wondering if this may be artifactual.  Her symptoms are not exertional at all.  I think a coronary CT-a is a reasonable test to better assess.  She was too tachycardic the first time.  We will give her metoprolol again but add ivabradine 7.5 mg  2 hours prior to the study to get better heart rate control.  If her CT-a is negative for coronary disease, would consider PFTs and a pulmonary referral.  Echo was unremarkable.  # Tachycardia: # Palpitations:   Independently reviewed her ambulatory monitor.  She had episodes of sinus arrhythmia, PACs, PVCs, and short runs of supraventricular tachycardia.  The SVT was not classic AVNRT or AVRT.  It seemed more like runs of PACs, though the P waves were not readily identifiable.  I suspect this is was causing her symptoms.  We will start metoprolol 50 mg twice daily to see if this helps.  Laboratory testing has been unremarkable.  # Hypertension:  Blood pressure reasonably controlled.  Continue amlodipine and add metoprolol as above.     Current medicines are reviewed at length with the patient today.  The patient does not have concerns regarding medicines.  The following changes have been made:  Add metoprolol  Labs/ tests ordered today include:  No orders of the defined types were placed in this encounter.    Disposition:   FU with Wilberta Dorvil C. Oval Linsey, MD, [facility] in 6 weeks    Signed, Trainer Oval Linsey, MD, [facility]  07/05/2019 1:06 PM    [facility]

## 2019-07-07 NOTE — Addendum Note (Signed)
Addended by: Therisa Doyne on: 07/07/2019 02:48 PM   Modules accepted: Orders

## 2019-07-19 LAB — BASIC METABOLIC PANEL
BUN: 13 mg/dL (ref 7–25)
CO2: 32 mmol/L (ref 20–32)
Calcium: 10 mg/dL (ref 8.6–10.4)
Chloride: 103 mmol/L (ref 98–110)
Creat: 0.86 mg/dL (ref 0.50–1.05)
Glucose, Bld: 102 mg/dL — ABNORMAL HIGH (ref 65–99)
Potassium: 4.8 mmol/L (ref 3.5–5.3)
Sodium: 142 mmol/L (ref 135–146)

## 2019-07-20 ENCOUNTER — Encounter (HOSPITAL_COMMUNITY): Payer: Self-pay

## 2019-07-20 ENCOUNTER — Telehealth (HOSPITAL_COMMUNITY): Payer: Self-pay | Admitting: Emergency Medicine

## 2019-07-20 NOTE — Telephone Encounter (Signed)
Left message on voicemail with name and callback number Darnetta Kesselman RN Navigator Cardiac Imaging [facility] [phone] Office [phone] Cell  

## 2019-07-21 ENCOUNTER — Other Ambulatory Visit: Payer: Self-pay

## 2019-07-21 ENCOUNTER — Ambulatory Visit
Admission: RE | Admit: 2019-07-21 | Discharge: 2019-07-21 | Disposition: A | Discharge status: Home / Self Care | Payer: Managed Care, Other (non HMO) | Source: Ambulatory Visit | Attending: Cardiovascular Disease | Admitting: Cardiovascular Disease

## 2019-07-21 DIAGNOSIS — R072 Precordial pain: Secondary | ICD-10-CM

## 2019-07-21 DIAGNOSIS — R9439 Abnormal result of other cardiovascular function study: Secondary | ICD-10-CM

## 2019-07-21 MED ORDER — METOPROLOL TARTRATE 5 MG/5ML IV SOLN
10.0000 mg | Freq: Once | INTRAVENOUS | Status: AC
  Administered 2019-07-21: 10 mg via INTRAVENOUS

## 2019-07-21 MED ORDER — METOPROLOL TARTRATE 5 MG/5ML IV SOLN
5.0000 mg | INTRAVENOUS | Status: DC | PRN
  Administered 2019-07-21: 13:00:00 5 mg via INTRAVENOUS

## 2019-07-21 MED ORDER — NITROGLYCERIN 0.4 MG SL SUBL
0.8000 mg | SUBLINGUAL_TABLET | Freq: Once | SUBLINGUAL | Status: AC
  Administered 2019-07-21: 13:00:00 0.8 mg via SUBLINGUAL

## 2019-07-21 MED ORDER — IOHEXOL 350 MG/ML SOLN
125.0000 mL | Freq: Once | INTRAVENOUS | Status: AC | PRN
  Administered 2019-07-21: 125 mL via INTRAVENOUS

## 2019-07-21 NOTE — Progress Notes (Signed)
Patient tolerated CT well. Drank water after. Ambulatory steady gait to exit.  

## 2019-07-25 ENCOUNTER — Ambulatory Visit (INDEPENDENT_AMBULATORY_CARE_PROVIDER_SITE_OTHER): Payer: Managed Care, Other (non HMO) | Admitting: Family

## 2019-07-25 DIAGNOSIS — G40309 Generalized idiopathic epilepsy and epileptic syndromes, not intractable, without status epilepticus: Secondary | ICD-10-CM | POA: Diagnosis not present

## 2019-07-25 DIAGNOSIS — Z5329 Procedure and treatment not carried out because of patient's decision for other reasons: Secondary | ICD-10-CM | POA: Diagnosis not present

## 2019-07-25 DIAGNOSIS — I10 Essential (primary) hypertension: Secondary | ICD-10-CM | POA: Diagnosis not present

## 2019-07-25 MED ORDER — AMLODIPINE BESYLATE 10 MG PO TABS: 10.0000 mg | ORAL_TABLET | Freq: Every day | ORAL | 1 refills | Status: DC

## 2019-07-25 NOTE — Progress Notes (Signed)
Patient was unavailable by phone today for scheduled visit. Left detailed message requesting that she call our office to reschedule at her earliest convenience.

## 2019-07-25 NOTE — Progress Notes (Signed)
Virtual Visit via Video Note  I connected with Tiffany [name] on 07/25/19 at 12:20 PM EST by a video enabled telemedicine application and verified that I am speaking with the correct person using two identifiers.  Location: Patient: home Provider: home   I discussed the limitations of evaluation and management by telemedicine and the availability of in person appointments. The patient expressed understanding and agreed to proceed.  History of Present Illness:  Patient later called back and we proceeded with a video visit at a later appointment time.  HTN- maintained on amlodipine 10 mg.  BP Readings from Last 3 Encounters:  07/21/19 115/64  07/05/19 128/78  06/09/19 124/73   Seizure disorder- has an upcoming follow up apt with neuro. On Lamictal. Reports no seizures.   Wt Readings from Last 3 Encounters:  07/05/19 177 lb (80.3 kg)  04/26/19 177 lb (80.3 kg)  04/20/19 176 lb (79.8 kg)   Atypical chest pain- has been following with cardiology and had a recent negative cardiac CT.    Past Medical History:  Diagnosis Date  . Hypertension   . Microscopic hematuria 09/25/2016   Had complete work up with Dr. Wendy Poet.   Cysto unrevealing,  CT scan nl   . Non Hodgkin's lymphoma ([place]) 2/08   stage III  . Obesity   . Seizure disorder ([place])   . Shortness of breath 07/05/2019     Social History   Socioeconomic History  . Marital status: Married    Spouse name: Iona Beard  . Number of children: 2  . Years of education: college  . Highest education level: Not on file  Occupational History  . Occupation: Engineer, building services: [employer]  Tobacco Use  . Smoking status: Never Smoker  . Smokeless tobacco: Never Used  Substance and Sexual Activity  . Alcohol use: No  . Drug use: No  . Sexual activity: Yes    Partners: Male  Other Topics Concern  . Not on file  Social History Narrative   2 children   Married   Works as a Curator,  movies   Social Determinants of Radio broadcast assistant Strain:   . Difficulty of Paying Living Expenses: Not on file  Food Insecurity:   . Worried About Charity fundraiser in the Last Year: Not on file  . Ran Out of Food in the Last Year: Not on file  Transportation Needs:   . Lack of Transportation (Medical): Not on file  . Lack of Transportation (Non-Medical): Not on file  Physical Activity:   . Days of Exercise per Week: Not on file  . Minutes of Exercise per Session: Not on file  Stress:   . Feeling of Stress : Not on file  Social Connections:   . Frequency of Communication with Friends and Family: Not on file  . Frequency of Social Gatherings with Friends and Family: Not on file  . Attends Religious Services: Not on file  . Active Member of Clubs or Organizations: Not on file  . Attends Archivist Meetings: Not on file  . Marital Status: Not on file  Intimate Partner Violence:   . Fear of Current or Ex-Partner: Not on file  . Emotionally Abused: Not on file  . Physically Abused: Not on file  . Sexually Abused: Not on file    Past Surgical History:  Procedure Laterality Date  . [place], 2002  . PORT-A-CATH REMOVAL  2009  .  PORTACATH PLACEMENT  2008    Family History  Problem Relation Age of Onset  . Breast cancer Mother   . Hypertension Mother   . COPD Mother   . Diabetes Mother   . Hepatitis C Father   . Hypertension Maternal Grandmother   . Hypertension Paternal Grandmother   . Colon cancer Neg Hx   . Colon polyps Neg Hx   . Esophageal cancer Neg Hx   . Stomach cancer Neg Hx   . Rectal cancer Neg Hx     No Known Allergies  Current Outpatient Medications on File Prior to Visit  Medication Sig Dispense Refill  . lamoTRIgine (LAMICTAL) 100 MG tablet Take 1 tablet (100 mg total) by mouth 2 (two) times daily. 180 tablet 3  . metoprolol tartrate (LOPRESSOR) 50 MG tablet Take 1 tablet (50 mg total) by mouth 2 (two) times daily.  180 tablet 3  . Multiple Vitamins-Minerals (MULTIVITAMIN WITH MINERALS) tablet Take 1 tablet by mouth daily.    . nitroGLYCERIN (NITROSTAT) 0.4 MG SL tablet Place 1 tablet (0.4 mg total) under the tongue every 5 (five) minutes as needed. 25 tablet 3   No current facility-administered medications on file prior to visit.    There were no vitals taken for this visit.      Observations/Objective:   Gen: Awake, alert, no acute distress Resp: Breathing is even and non-labored Psych: calm/pleasant demeanor Neuro: Alert and Oriented x 3, + facial symmetry, speech is clear.   Assessment and Plan:  HTN-  bp stable on current regimen, continue same.  Seizure disorder- stable, management per neurology.   Atypical chest pain- negative cardiac CT on 07/21/19    20 minutes spent on today's visit.  Follow Up Instructions:    I discussed the assessment and treatment plan with the patient. The patient was provided an opportunity to ask questions and all were answered. The patient agreed with the plan and demonstrated an understanding of the instructions.   The patient was advised to call back or seek an in-person evaluation if the symptoms worsen or if the condition fails to improve as anticipated.  Nance Pear, NP

## 2019-07-25 NOTE — Addendum Note (Signed)
Addended by: Debbrah Alar on: 07/25/2019 01:26 PM   Modules accepted: Orders, Level of Service

## 2019-08-02 DIAGNOSIS — R0602 Shortness of breath: Secondary | ICD-10-CM

## 2019-08-15 ENCOUNTER — Other Ambulatory Visit: Payer: Self-pay | Admitting: Family

## 2019-08-15 ENCOUNTER — Other Ambulatory Visit: Payer: Self-pay

## 2019-08-15 ENCOUNTER — Ambulatory Visit
Admission: RE | Admit: 2019-08-15 | Discharge: 2019-08-15 | Disposition: A | Discharge status: Home / Self Care | Payer: Managed Care, Other (non HMO) | Source: Ambulatory Visit

## 2019-08-15 DIAGNOSIS — Z1231 Encounter for screening mammogram for malignant neoplasm of breast: Secondary | ICD-10-CM

## 2019-08-22 ENCOUNTER — Other Ambulatory Visit: Payer: Self-pay

## 2019-08-22 ENCOUNTER — Ambulatory Visit (INDEPENDENT_AMBULATORY_CARE_PROVIDER_SITE_OTHER): Payer: 59 | Admitting: Adult Health

## 2019-08-22 ENCOUNTER — Encounter: Payer: Self-pay | Admitting: Adult Health

## 2019-08-22 VITALS — BP 119/75 | HR 70 | Temp 98.6°F | Ht 59.0 in | Wt 182.0 lb

## 2019-08-22 DIAGNOSIS — Z79899 Other long term (current) drug therapy: Secondary | ICD-10-CM | POA: Diagnosis not present

## 2019-08-22 DIAGNOSIS — E669 Obesity, unspecified: Secondary | ICD-10-CM | POA: Diagnosis not present

## 2019-08-22 DIAGNOSIS — G40309 Generalized idiopathic epilepsy and epileptic syndromes, not intractable, without status epilepticus: Secondary | ICD-10-CM

## 2019-08-22 MED ORDER — LAMOTRIGINE 100 MG PO TABS: 100.0000 mg | ORAL_TABLET | Freq: Two times a day (BID) | ORAL | 3 refills | Status: DC

## 2019-08-22 NOTE — Patient Instructions (Addendum)
Your Plan:  Continue Lamictal 100 mg twice daily for seizure prophylaxis -1 year refill sent to your pharmacy  Will check lamotrigine level -you will be called with any abnormal results  Referral placed to [facility] Address: [place], [place], [place] [address] Phone: [phone]   Follow-up in 1 year or call earlier if needed     Thank you for coming to see [place] at [facility]. I hope we have been able to provide you high quality care today.  You may receive a patient satisfaction survey over the next few weeks. We would appreciate your feedback and comments so that we may continue to improve ourselves and the health of our patients.

## 2019-08-22 NOTE — Progress Notes (Signed)
[facility]  PATIENT: Tiffany [name] DOB: [birth year]   REASON FOR VISIT: follow-up for generalized epilepsy [facility] provider: Dr. Leonie Man  Chief complaint: Chief Complaint  Patient presents with  . Follow-up    Follow up for epilepsy room 9 pt alone     HISTORY OF PRESENT ILLNESS:  Mr. Tiffany [name] is a 54 year old female who is being seen today, 08/22/2019, for 1 year seizure follow-up.  Continues on Lamictal 100 mg twice daily tolerating well without recent seizure activity.  Last seizure activity approximately 8 years ago.  No neurological concerns at this time.  She does report ongoing difficulty with weight gain.    History copied for reference purposes only UPDATE 3/12/2020CM [name], 54 year old female returns for follow-up with history of generalized seizure disorder.  Last seizure occurred 7 years ago.  She is currently on Lamictal 100 mg twice daily without side effects.  Both MRI of the brain and EEG in the past have been normal.  She continues to gain weight.  She denies any interval medical issues she exercises very little.  She returns for reevaluation  UPDATE 08/18/17 CM Ms Tiffany [name] , 54 year old female returns for yearly follow-up.  She was last seen 08/07/2016.  She has a history of generalized seizure disorder. Her last seizure was approximately 6 years ago. She is currently on Lamictal tolerating the medication without difficulty. EEG in the past has been normal as well as MRI of the brain. She has not had any interval medical problems. She is overweight. She claims she loves breads, pasta and sweets.  She was encouraged to try to lose weight to prevent long-term chronic medical issues.  She returns for reevaluation and refills  REVIEW OF SYSTEMS: Full 14 system review of systems performed and notable only for those listed, all others are neg:  Constitutional: Weight gain Cardiovascular: neg Ear/Nose/Throat: neg  Skin: neg Eyes:  neg Respiratory: neg Gastroitestinal: neg  Hematology/Lymphatic: neg  Endocrine: neg Musculoskeletal:neg Allergy/Immunology: neg Neurological: History of seizure disorder Psychiatric: neg Sleep : neg   ALLERGIES: No Known Allergies  HOME MEDICATIONS: Outpatient Medications Prior to Visit  Medication Sig Dispense Refill  . amLODipine (NORVASC) 10 MG tablet Take 1 tablet (10 mg total) by mouth daily. 90 tablet 1  . metoprolol tartrate (LOPRESSOR) 50 MG tablet Take 1 tablet (50 mg total) by mouth 2 (two) times daily. 180 tablet 3  . Multiple Vitamins-Minerals (MULTIVITAMIN WITH MINERALS) tablet Take 1 tablet by mouth daily.    . nitroGLYCERIN (NITROSTAT) 0.4 MG SL tablet Place 1 tablet (0.4 mg total) under the tongue every 5 (five) minutes as needed. 25 tablet 3  . lamoTRIgine (LAMICTAL) 100 MG tablet Take 1 tablet (100 mg total) by mouth 2 (two) times daily. 180 tablet 3   No facility-administered medications prior to visit.    PAST MEDICAL HISTORY: Past Medical History:  Diagnosis Date  . Hypertension   . Microscopic hematuria 09/25/2016   Had complete work up with Dr. Wendy Poet.   Cysto unrevealing,  CT scan nl   . Non Hodgkin's lymphoma ([place]) 2/08   stage III  . Obesity   . Seizure disorder ([place])   . Shortness of breath 07/05/2019    PAST SURGICAL HISTORY: Past Surgical History:  Procedure Laterality Date  . [place], 2002  . PORT-A-CATH REMOVAL  2009  . PORTACATH PLACEMENT  2008    FAMILY HISTORY: Family History  Problem Relation Age of Onset  . Breast cancer Mother   .  Hypertension Mother   . COPD Mother   . Diabetes Mother   . Hepatitis C Father   . Hypertension Maternal Grandmother   . Hypertension Paternal Grandmother   . Colon cancer Neg Hx   . Colon polyps Neg Hx   . Esophageal cancer Neg Hx   . Stomach cancer Neg Hx   . Rectal cancer Neg Hx     SOCIAL HISTORY: Social History   Socioeconomic History  . Marital status: Married     Spouse name: Iona Beard  . Number of children: 2  . Years of education: college  . Highest education level: Not on file  Occupational History  . Occupation: Engineer, building services: [employer]  Tobacco Use  . Smoking status: Never Smoker  . Smokeless tobacco: Never Used  Substance and Sexual Activity  . Alcohol use: No  . Drug use: No  . Sexual activity: Yes    Partners: Male  Other Topics Concern  . Not on file  Social History Narrative   2 children   Married   Works as a Curator, movies   Social Determinants of Radio broadcast assistant Strain:   . Difficulty of Paying Living Expenses:   Food Insecurity:   . Worried About Charity fundraiser in the Last Year:   . Arboriculturist in the Last Year:   Transportation Needs:   . Film/video editor (Medical):   [name] Lack of Transportation (Non-Medical):   Physical Activity:   . Days of Exercise per Week:   . Minutes of Exercise per Session:   Stress:   . Feeling of Stress :   Social Connections:   . Frequency of Communication with Friends and Family:   . Frequency of Social Gatherings with Friends and Family:   . Attends Religious Services:   . Active Member of Clubs or Organizations:   . Attends Archivist Meetings:   [name] Marital Status:   Intimate Partner Violence:   . Fear of Current or Ex-Partner:   . Emotionally Abused:   [name] Physically Abused:   . Sexually Abused:      PHYSICAL EXAM  Vitals:   08/22/19 0825  BP: 119/75  Pulse: 70  Temp: 98.6 F (37 C)  Weight: 182 lb (82.6 kg)  Height: 4' 11" (1.499 m)   Body mass index is 36.76 kg/m.   General: well developed, well nourished, pleasant middle-aged African-American female, seated, in no evident distress Head: head normocephalic and atraumatic.   Neck: supple with no carotid or supraclavicular bruits Cardiovascular: regular rate and rhythm, no murmurs Musculoskeletal: no deformity Skin:  no  rash/petichiae Vascular:  Normal pulses all extremities   Neurologic Exam Mental Status: Awake and fully alert.   Normal speech and language.  Oriented to place and time. Recent and remote memory intact. Attention span, concentration and fund of knowledge appropriate. Mood and affect appropriate.  Cranial Nerves: Pupils equal, briskly reactive to light. Extraocular movements full without nystagmus. Visual fields full to confrontation. Hearing intact. Facial sensation intact. Face, tongue, palate moves normally and symmetrically.  Motor: Normal bulk and tone. Normal strength in all tested extremity muscles. Sensory.: intact to touch , pinprick , position and vibratory sensation.  Coordination: Rapid alternating movements normal in all extremities. Finger-to-nose and heel-to-shin performed accurately bilaterally. Gait and Station: Arises from chair without difficulty. Stance is normal. Gait demonstrates normal stride length and balance Reflexes: 1+ and symmetric. Toes downgoing.  DIAGNOSTIC DATA (LABS, IMAGING, TESTING)  BMP Latest Ref Rng & Units 07/19/2019 01/04/2019 09/22/2016  Glucose 65 - 99 mg/dL 102(H) 145(H) 83  BUN 7 - 25 mg/dL _0 Creatinine 0.50 - 1.05 mg/dL 0.86 0.84 0.73  BUN/Creat Ratio 6 - 22 (calc) NOT APPLICABLE - -  Sodium 350 - 146 mmol/L 142 137 139  Potassium 3.5 - 5.3 mmol/L 4.8 3.8 5.2  Chloride 98 - 110 mmol/L 103 101 103  CO2 20 - 32 mmol/L 32 26 30  Calcium 8.6 - 10.4 mg/dL 10.0 9.3 10.3(H)   Hepatic Function Latest Ref Rng & Units 01/04/2019 09/22/2016 02/01/2015  Total Protein 6.5 - 8.1 g/dL 7.5 7.5 -  Albumin 3.5 - 5.0 g/dL 4.3 4.3 -  AST 15 - 41 U/L 19 15 -  ALT 0 - 44 U/L 17 15 -  Alk Phosphatase 38 - 126 U/L 104 107 111  Total Bilirubin 0.3 - 1.2 mg/dL 0.5 0.4 -  Bilirubin, Direct 0.0 - 0.2 mg/dL <0.1 0.1 -       ASSESSMENT AND PLAN  54 y.o. year old female  has a past medical history of Non Hodgkin's lymphoma ([place]) (2/08); Obesity; Seizure  disorder ([place]); and hypertension who continues to follow-up in this office for seizure management. Last seizure was 8 years ago.  She is currently on Lamictal 100 mg twice daily with excellent seizure control.   Continue Lamictal 100 mg twice daily for seizure prophylaxis -1 year refill placed Obtain lamotrigine level as yearly levels recommended.  Recent renal and hepatic panel satisfactory Referral placed to healthy weight and wellness for assistance regarding weight loss goals Advised to call office with any seizure concerns  [facility] provider Dr. Leonie Man who is out of the office today therefore office visit note sent to work in MD  Follow-up in 1 year or earlier if needed   I spent 20 minutes of face-to-face and non-face-to-face time with patient.  This included previsit chart review, lab review, study review, order entry, electronic health record documentation, patient education   Frann Rider, [facility]  [facility] [address] [place], [place] [address]  Phone [phone] Fax [phone] Note: This document was prepared with digital dictation and possible smart phrase technology. Any transcriptional errors that result from this process are unintentional.

## 2019-08-26 ENCOUNTER — Ambulatory Visit: Payer: 59 | Admitting: Student

## 2019-08-26 LAB — LAMOTRIGINE LEVEL: Lamotrigine Lvl: 5.1 ug/mL (ref 4.0–18.0)

## 2019-09-02 ENCOUNTER — Ambulatory Visit (INDEPENDENT_AMBULATORY_CARE_PROVIDER_SITE_OTHER): Payer: 59 | Admitting: Physician Assistant

## 2019-09-02 ENCOUNTER — Encounter: Payer: Self-pay | Admitting: Physician Assistant

## 2019-09-02 ENCOUNTER — Other Ambulatory Visit: Payer: Self-pay

## 2019-09-02 VITALS — BP 110/62 | HR 74 | Ht 59.0 in | Wt 178.2 lb

## 2019-09-02 DIAGNOSIS — R0789 Other chest pain: Secondary | ICD-10-CM

## 2019-09-02 DIAGNOSIS — R0600 Dyspnea, unspecified: Secondary | ICD-10-CM | POA: Diagnosis not present

## 2019-09-02 DIAGNOSIS — I10 Essential (primary) hypertension: Secondary | ICD-10-CM | POA: Diagnosis not present

## 2019-09-02 DIAGNOSIS — R0609 Other forms of dyspnea: Secondary | ICD-10-CM

## 2019-09-02 DIAGNOSIS — C8590 Non-Hodgkin lymphoma, unspecified, unspecified site: Secondary | ICD-10-CM | POA: Diagnosis not present

## 2019-09-02 NOTE — Progress Notes (Signed)
Cardiology Office Note:    Date:  09/04/2019   ID:  Tiffany [name], DOB Jan 08, 1966, MRN [id number]  PCP:  Debbrah Alar, NP  Cardiologist:  Skeet Latch, MD  Electrophysiologist:  None   Referring MD: Debbrah Alar, NP   Chief Complaint  Patient presents with  . Follow-up    seen for Dr. Oval Linsey    History of Present Illness:    Tiffany [name] is a 54 y.o. female with a hx of non-Hodgkin's lymphoma, history of seizure disorder, hypertension and abnormal stress test.  Echocardiogram obtained in November 2020 revealed EF 60 to 65%, mild LVH, grade 1 DD.  Myoview obtained on 04/20/2019 showed EF 37%, ischemia in the basal to mid inferoseptal, basal to mid inferior and apical region.  She was referred for coronary CTA, however this was canceled because her heart rate was in the 70s after taking metoprolol.  She was last seen by Dr. Oval Linsey on 07/05/2019 who started her on metoprolol tartrate 50 mg twice daily, she was instructed to take: Or 7.5 mg and metoprolol tartrate 100 mg 2 hours prior to the coronary CT.  Coronary CTA obtained on 07/21/2019 demonstrated low coronary calcium score of 2.24 which placed the patient at 85th percentile for age and sex matched control, normal coronary arteries with right dominance, no evidence of CAD.  Patient presents today for cardiology office visit.  She continues to have intermittent dyspnea, however no chest pain.  She says sometimes she gets short of breath just by talking.  When she takes a deep breath, she feels a slight dull ache in the anterior chest.  However, this appears to be related to inspiration only.  Recent coronary CT showed nonspecific soft tissue stranding within the anterior mediastinum.  As this is similar in appearance when compared to previous image on 09/17/2011.  It is unclear to me if her inspiratory chest discomfort is related to the soft tissue stranding.  She denies any recent injury to the area.  She  does have a dry cough this is particularly worse at night.  However does not have any nasal drainage, fever or chills.  I recommend proceeding with the previously scheduled PFT and if normal, I would recommend she increase her exercise level.  She can follow-up with Dr. Oval Linsey in 1 year.   Past Medical History:  Diagnosis Date  . Hypertension   . Microscopic hematuria 09/25/2016   Had complete work up with Dr. Wendy Poet.   Cysto unrevealing,  CT scan nl   . Non Hodgkin's lymphoma ([place]) 2/08   stage III  . Obesity   . Seizure disorder ([place])   . Shortness of breath 07/05/2019    Past Surgical History:  Procedure Laterality Date  . [place], 2002  . PORT-A-CATH REMOVAL  2009  . PORTACATH PLACEMENT  2008    Current Medications: Current Meds  Medication Sig  . amLODipine (NORVASC) 10 MG tablet Take 1 tablet (10 mg total) by mouth daily.  [name] lamoTRIgine (LAMICTAL) 100 MG tablet Take 1 tablet (100 mg total) by mouth 2 (two) times daily.  . metoprolol tartrate (LOPRESSOR) 50 MG tablet Take 1 tablet (50 mg total) by mouth 2 (two) times daily.  . Multiple Vitamins-Minerals (MULTIVITAMIN WITH MINERALS) tablet Take 1 tablet by mouth daily.  . nitroGLYCERIN (NITROSTAT) 0.4 MG SL tablet Place 1 tablet (0.4 mg total) under the tongue every 5 (five) minutes as needed.     Allergies:   Patient has no known allergies.  Social History   Socioeconomic History  . Marital status: Married    Spouse name: Iona Beard  . Number of children: 2  . Years of education: college  . Highest education level: Not on file  Occupational History  . Occupation: Engineer, building services: [employer]  Tobacco Use  . Smoking status: Never Smoker  . Smokeless tobacco: Never Used  Substance and Sexual Activity  . Alcohol use: No  . Drug use: No  . Sexual activity: Yes    Partners: Male  Other Topics Concern  . Not on file  Social History Narrative   2 children   Married   Works as a  Curator, movies   Social Determinants of Radio broadcast assistant Strain:   . Difficulty of Paying Living Expenses:   Food Insecurity:   . Worried About Charity fundraiser in the Last Year:   . Arboriculturist in the Last Year:   Transportation Needs:   . Film/video editor (Medical):   [name] Lack of Transportation (Non-Medical):   Physical Activity:   . Days of Exercise per Week:   . Minutes of Exercise per Session:   Stress:   . Feeling of Stress :   Social Connections:   . Frequency of Communication with Friends and Family:   . Frequency of Social Gatherings with Friends and Family:   . Attends Religious Services:   . Active Member of Clubs or Organizations:   . Attends Archivist Meetings:   [name] Marital Status:      Family History: The patient's family history includes Breast cancer in her mother; COPD in her mother; Diabetes in her mother; Hepatitis C in her father; Hypertension in her maternal grandmother, mother, and paternal grandmother. There is no history of Colon cancer, Colon polyps, Esophageal cancer, Stomach cancer, or Rectal cancer.  ROS:   Please see the history of present illness.     All other systems reviewed and are negative.  EKGs/Labs/Other Studies Reviewed:    The following studies were reviewed today:  Echo 04/20/2019 1. Left ventricular ejection fraction, by visual estimation, is 60 to  65%. The left ventricle has normal function. Left ventricular septal wall  thickness was mildly increased. Mildly increased left ventricular  posterior wall thickness. There is mildly  increased left ventricular hypertrophy.  2. Left ventricular diastolic parameters are consistent with Grade I  diastolic dysfunction (impaired relaxation).  3. The left ventricle has no regional wall motion abnormalities.  4. Global right ventricle has normal systolic function.The right  ventricular size is normal. No increase in right  ventricular wall  thickness.  5. Left atrial size was normal.  6. Right atrial size was normal.  7. The mitral valve is normal in structure. No evidence of mitral valve  regurgitation. No evidence of mitral stenosis.  8. The tricuspid valve is normal in structure. Tricuspid valve  regurgitation is trivial.  9. The aortic valve is tricuspid. Aortic valve regurgitation is not  visualized. No evidence of aortic valve sclerosis or stenosis.  10. The pulmonic valve was normal in structure. Pulmonic valve  regurgitation is not visualized.  11. Normal pulmonary artery systolic pressure.  12. The inferior vena cava is normal in size with greater than 50%  respiratory variability, suggesting right atrial pressure of 3 mmHg.     Coronary CT 07/21/2019 IMPRESSION: 1. Low coronary calcium score of 2.24. This was 85th percentile for age and  sex matched control.  2. Normal coronary origin with right dominance.  3. No evidence of CAD.  4. CAD-RADS 0. No evidence of CAD (0%). Consider non-atherosclerotic causes of chest pain.    EKG:  EKG is not ordered today.    Recent Labs: 01/04/2019: ALT 17; Hemoglobin 11.8; Magnesium 2.0; Platelets 413; TSH 0.893 07/19/2019: BUN 13; Creat 0.86; Potassium 4.8; Sodium 142  Recent Lipid Panel    Component Value Date/Time   CHOL 178 09/22/2016 1318   TRIG 109 09/22/2016 1318   HDL 57 09/22/2016 1318   CHOLHDL 3.1 09/22/2016 1318   VLDL 22 09/22/2016 1318   LDLCALC 99 09/22/2016 1318    Physical Exam:    VS:  BP 110/62   Pulse 74   Ht 4\' 11"  (1.499 m)   Wt 178 lb 3.2 oz (80.8 kg)   SpO2 96%   BMI 35.99 kg/m     Wt Readings from Last 3 Encounters:  09/02/19 178 lb 3.2 oz (80.8 kg)  08/22/19 182 lb (82.6 kg)  07/05/19 177 lb (80.3 kg)     GEN:  Well nourished, well developed in no acute distress HEENT: Normal NECK: No JVD; No carotid bruits LYMPHATICS: No lymphadenopathy CARDIAC: RRR, no murmurs, rubs, gallops RESPIRATORY:   Clear to auscultation without rales, wheezing or rhonchi  ABDOMEN: Soft, non-tender, non-distended MUSCULOSKELETAL:  No edema; No deformity  SKIN: Warm and dry NEUROLOGIC:  Alert and oriented x 3 PSYCHIATRIC:  Normal affect   ASSESSMENT:    1. DOE (dyspnea on exertion)   2. Atypical chest pain   3. Non-Hodgkin's lymphoma, unspecified body region, unspecified non-Hodgkin lymphoma type ([place])   4. Essential hypertension    PLAN:    In order of problems listed above:  1. Dyspnea on exertion: Recent coronary CTA was negative for any blockage.  Proceed with pulmonary function test.  If abnormal will refer to pulmonologist.  If the pulmonary function test is normal, we would recommend she continue to increase activity level  2. Atypical chest pain: Recent coronary CT was negative for coronary artery disease although she does seems to have aortic atherosclerosis.  3. Non-Hodgkin's lymphoma: Diagnosed in 2008.  4. Hypertension: Continue on current therapy.   Medication Adjustments/Labs and Tests Ordered: Current medicines are reviewed at length with the patient today.  Concerns regarding medicines are outlined above.  No orders of the defined types were placed in this encounter.  No orders of the defined types were placed in this encounter.   Patient Instructions  Medication Instructions:  Your physician recommends that you continue on your current medications as directed. Please refer to the Current Medication list given to you today.  *If you need a refill on your cardiac medications before your next appointment, please call your pharmacy*  Lab Work: NONE ordered at this time of appointment   If you have labs (blood work) drawn today and your tests are completely normal, you will receive your results only by: [name] MyChart Message (if you have MyChart) OR . A paper copy in the mail If you have any lab test that is abnormal or we need to change your treatment, we will call you to  review the results.  Testing/Procedures: NONE ordered at this time of appointment   Follow-Up: At [facility], you and your health needs are our priority.  As part of our continuing mission to provide you with exceptional heart care, we have created designated Provider Care Teams.  These Care Teams include your  primary Cardiologist (physician) and Advanced Practice Providers (APPs -  Physician Assistants and Nurse Practitioners) who all work together to provide you with the care you need, when you need it.   Your next appointment:   1 year(s)  The format for your next appointment:   In Person  Provider:   Skeet Latch, MD  Other Instructions      Signed, Almyra Deforest, [place]  09/04/2019 11:52 PM    [place]

## 2019-09-02 NOTE — Patient Instructions (Addendum)
Medication Instructions:  Your physician recommends that you continue on your current medications as directed. Please refer to the Current Medication list given to you today.  *If you need a refill on your cardiac medications before your next appointment, please call your pharmacy*  Lab Work: NONE ordered at this time of appointment   If you have labs (blood work) drawn today and your tests are completely normal, you will receive your results only by: [name] MyChart Message (if you have MyChart) OR . A paper copy in the mail If you have any lab test that is abnormal or we need to change your treatment, we will call you to review the results.  Testing/Procedures: NONE ordered at this time of appointment   Follow-Up: At [facility], you and your health needs are our priority.  As part of our continuing mission to provide you with exceptional heart care, we have created designated Provider Care Teams.  These Care Teams include your primary Cardiologist (physician) and Advanced Practice Providers (APPs -  Physician Assistants and Nurse Practitioners) who all work together to provide you with the care you need, when you need it.   Your next appointment:   1 year(s)  The format for your next appointment:   In Person  Provider:   Skeet Latch, MD  Other Instructions

## 2019-09-04 ENCOUNTER — Encounter: Payer: Self-pay | Admitting: Physician Assistant

## 2019-09-08 ENCOUNTER — Telehealth: Payer: Self-pay | Admitting: *Deleted

## 2019-09-08 NOTE — Telephone Encounter (Signed)
Called patient for 90 day phone call follow up for CADFEM Study.. Left message for patient to call me back.       Tiffany [name]  09/08/2019 14:51 p.m.

## 2019-09-13 ENCOUNTER — Encounter: Payer: Self-pay | Admitting: *Deleted

## 2019-09-13 ENCOUNTER — Other Ambulatory Visit (HOSPITAL_COMMUNITY): Payer: 59

## 2019-09-13 ENCOUNTER — Other Ambulatory Visit (HOSPITAL_COMMUNITY)
Admission: RE | Admit: 2019-09-13 | Discharge: 2019-09-13 | Disposition: A | Discharge status: Home / Self Care | Payer: 59 | Source: Ambulatory Visit | Attending: Cardiovascular Disease | Admitting: Cardiovascular Disease

## 2019-09-13 DIAGNOSIS — Z20822 Contact with and (suspected) exposure to covid-19: Secondary | ICD-10-CM | POA: Diagnosis not present

## 2019-09-13 DIAGNOSIS — Z006 Encounter for examination for normal comparison and control in clinical research program: Secondary | ICD-10-CM

## 2019-09-13 DIAGNOSIS — Z01812 Encounter for preprocedural laboratory examination: Secondary | ICD-10-CM | POA: Diagnosis present

## 2019-09-13 LAB — SARS CORONAVIRUS 2 (TAT 6-24 HRS): SARS Coronavirus 2: NEGATIVE

## 2019-09-16 ENCOUNTER — Ambulatory Visit (HOSPITAL_COMMUNITY)
Admission: RE | Admit: 2019-09-16 | Discharge: 2019-09-16 | Disposition: A | Discharge status: Home / Self Care | Payer: 59 | Source: Ambulatory Visit | Attending: Cardiovascular Disease | Admitting: Cardiovascular Disease

## 2019-09-16 ENCOUNTER — Other Ambulatory Visit: Payer: Self-pay

## 2019-09-16 DIAGNOSIS — R0602 Shortness of breath: Secondary | ICD-10-CM | POA: Diagnosis present

## 2019-09-16 LAB — PULMONARY FUNCTION TEST
DL/VA % pred: 103 %
DL/VA: 4.63 ml/min/mmHg/L
DLCO unc % pred: 111 %
DLCO unc: 19.56 ml/min/mmHg
FEF 25-75 Post: 2.31 L/sec
FEF 25-75 Pre: 2.02 L/sec
FEF2575-%Change-Post: 14 %
FEF2575-%Pred-Post: 111 %
FEF2575-%Pred-Pre: 97 %
FEV1-%Change-Post: 2 %
FEV1-%Pred-Post: 131 %
FEV1-%Pred-Pre: 127 %
FEV1-Post: 2.45 L
FEV1-Pre: 2.38 L
FEV1FVC-%Change-Post: 3 %
FEV1FVC-%Pred-Pre: 95 %
FEV6-%Change-Post: 0 %
FEV6-%Pred-Post: 134 %
FEV6-%Pred-Pre: 135 %
FEV6-Post: 3.05 L
FEV6-Pre: 3.06 L
FEV6FVC-%Change-Post: 0 %
FEV6FVC-%Pred-Post: 103 %
FEV6FVC-%Pred-Pre: 103 %
FVC-%Change-Post: 0 %
FVC-%Pred-Post: 130 %
FVC-%Pred-Pre: 131 %
FVC-Post: 3.05 L
FVC-Pre: 3.07 L
Post FEV1/FVC ratio: 81 %
Post FEV6/FVC ratio: 100 %
Pre FEV1/FVC ratio: 78 %
Pre FEV6/FVC Ratio: 100 %
RV % pred: 102 %
RV: 1.59 L
TLC % pred: 108 %
TLC: 4.69 L

## 2019-09-16 MED ORDER — ALBUTEROL SULFATE (2.5 MG/3ML) 0.083% IN NEBU
2.5000 mg | INHALATION_SOLUTION | Freq: Once | RESPIRATORY_TRACT | Status: AC
  Administered 2019-09-16: 2.5 mg via RESPIRATORY_TRACT

## 2019-09-19 NOTE — Research (Signed)
CADFEM 90 Day Phone Call Followup   Emailed patient and she let me know overall she is well and is waiting on results of pulmonary function test. I saw that she has still been have DOE. She has now completed the study.     Tiffany [name]  09/13/2019

## 2019-09-22 ENCOUNTER — Telehealth: Payer: Self-pay | Admitting: *Deleted

## 2019-09-22 DIAGNOSIS — R942 Abnormal results of pulmonary function studies: Secondary | ICD-10-CM

## 2019-09-22 NOTE — Telephone Encounter (Signed)
Left message to call back and Mychart message sent, referral placed

## 2019-09-22 NOTE — Telephone Encounter (Signed)
-----   Message from Skeet Latch, MD sent at 09/21/2019 11:28 PM EDT ----- Pulmonary function test showed that her lungs are mildly abnormal.  Recommend referral to pulmonology to see if this could be contributing to her dyspnea.

## 2019-10-03 NOTE — Telephone Encounter (Signed)
Patient returned your call.

## 2019-10-03 NOTE — Telephone Encounter (Signed)
Advised patient, verbalized understanding  

## 2019-10-20 ENCOUNTER — Other Ambulatory Visit (INDEPENDENT_AMBULATORY_CARE_PROVIDER_SITE_OTHER): Payer: Self-pay | Admitting: Family Medicine

## 2019-10-20 ENCOUNTER — Other Ambulatory Visit: Payer: Self-pay

## 2019-10-20 ENCOUNTER — Ambulatory Visit (INDEPENDENT_AMBULATORY_CARE_PROVIDER_SITE_OTHER): Payer: 59 | Admitting: Family Medicine

## 2019-10-20 ENCOUNTER — Encounter (INDEPENDENT_AMBULATORY_CARE_PROVIDER_SITE_OTHER): Payer: Self-pay | Admitting: Family Medicine

## 2019-10-20 VITALS — BP 111/74 | HR 73 | Temp 98.1°F | Ht 59.0 in | Wt 180.0 lb

## 2019-10-20 DIAGNOSIS — Z6836 Body mass index (BMI) 36.0-36.9, adult: Secondary | ICD-10-CM

## 2019-10-20 DIAGNOSIS — Z1331 Encounter for screening for depression: Secondary | ICD-10-CM | POA: Diagnosis not present

## 2019-10-20 DIAGNOSIS — R0602 Shortness of breath: Secondary | ICD-10-CM | POA: Diagnosis not present

## 2019-10-20 DIAGNOSIS — I10 Essential (primary) hypertension: Secondary | ICD-10-CM

## 2019-10-20 DIAGNOSIS — Z0289 Encounter for other administrative examinations: Secondary | ICD-10-CM

## 2019-10-20 DIAGNOSIS — Z9189 Other specified personal risk factors, not elsewhere classified: Secondary | ICD-10-CM | POA: Diagnosis not present

## 2019-10-20 DIAGNOSIS — G40309 Generalized idiopathic epilepsy and epileptic syndromes, not intractable, without status epilepticus: Secondary | ICD-10-CM

## 2019-10-20 DIAGNOSIS — R5383 Other fatigue: Secondary | ICD-10-CM

## 2019-10-21 ENCOUNTER — Encounter (INDEPENDENT_AMBULATORY_CARE_PROVIDER_SITE_OTHER): Payer: Self-pay | Admitting: Family Medicine

## 2019-10-24 LAB — VITAMIN B12: Vitamin B-12: 821 pg/mL (ref 200–1100)

## 2019-10-24 LAB — LIPID PANEL W/REFLEX DIRECT LDL
Cholesterol: 177 mg/dL (ref <200)
HDL: 60 mg/dL (ref >=50)
LDL Cholesterol (Calc): 97 mg/dL (calc)
Non-HDL Cholesterol (Calc): 117 mg/dL (calc) (ref <130)
Total CHOL/HDL Ratio: 3 (calc) (ref <5.0)
Triglycerides: 106 mg/dL (ref <150)

## 2019-10-24 LAB — CBC WITH DIFFERENTIAL/PLATELET
Absolute Monocytes: 616 cells/uL (ref 200–950)
Basophils Absolute: 32 cells/uL (ref 0–200)
Basophils Relative: 0.4 %
Eosinophils Absolute: 240 cells/uL (ref 15–500)
Eosinophils Relative: 3 %
HCT: 40.5 % (ref 35.0–45.0)
Hemoglobin: 13 g/dL (ref 11.7–15.5)
Lymphs Abs: 2224 cells/uL (ref 850–3900)
MCH: 26 pg — ABNORMAL LOW (ref 27.0–33.0)
MCHC: 32.1 g/dL (ref 32.0–36.0)
MCV: 81 fL (ref 80.0–100.0)
MPV: 9.7 fL (ref 7.5–12.5)
Monocytes Relative: 7.7 %
Neutro Abs: 4888 cells/uL (ref 1500–7800)
Neutrophils Relative %: 61.1 %
Platelets: 450 10*3/uL — ABNORMAL HIGH (ref 140–400)
RBC: 5 10*6/uL (ref 3.80–5.10)
RDW: 13.1 % (ref 11.0–15.0)
Total Lymphocyte: 27.8 %
WBC: 8 10*3/uL (ref 3.8–10.8)

## 2019-10-24 LAB — COMPLETE METABOLIC PANEL WITH GFR
AG Ratio: 1.6 (calc) (ref 1.0–2.5)
ALT: 26 U/L (ref 6–29)
AST: 18 U/L (ref 10–35)
Albumin: 4.7 g/dL (ref 3.6–5.1)
Alkaline phosphatase (APISO): 111 U/L (ref 37–153)
BUN: 17 mg/dL (ref 7–25)
CO2: 29 mmol/L (ref 20–32)
Calcium: 9.8 mg/dL (ref 8.6–10.4)
Chloride: 101 mmol/L (ref 98–110)
Creat: 0.76 mg/dL (ref 0.50–1.05)
GFR, Est African American: 105 mL/min/{1.73_m2} (ref >=60)
GFR, Est Non African American: 90 mL/min/{1.73_m2} (ref >=60)
Globulin: 2.9 g/dL (calc) (ref 1.9–3.7)
Glucose, Bld: 109 mg/dL — ABNORMAL HIGH (ref 65–99)
Potassium: 4.6 mmol/L (ref 3.5–5.3)
Sodium: 137 mmol/L (ref 135–146)
Total Bilirubin: 0.5 mg/dL (ref 0.2–1.2)
Total Protein: 7.6 g/dL (ref 6.1–8.1)

## 2019-10-24 LAB — HEMOGLOBIN A1C W/OUT EAG: Hgb A1c MFr Bld: 5.5 % of total Hgb (ref <5.7)

## 2019-10-24 LAB — VITAMIN D 1,25 DIHYDROXY
Vitamin D 1, 25 (OH)2 Total: 73 pg/mL — ABNORMAL HIGH (ref 18–72)
Vitamin D2 1, 25 (OH)2: <8 pg/mL
Vitamin D3 1, 25 (OH)2: 73 pg/mL

## 2019-10-24 LAB — INSULIN, RANDOM: Insulin: 17.1 u[IU]/mL

## 2019-10-24 LAB — T4, FREE: Free T4: 1.1 ng/dL (ref 0.8–1.8)

## 2019-10-24 LAB — TSH: TSH: 1.49 mIU/L

## 2019-10-24 LAB — FOLATE: Folate: 17.8 ng/mL

## 2019-10-24 LAB — VITAMIN D 25 HYDROXY (VIT D DEFICIENCY, FRACTURES): Vit D, 25-Hydroxy: 45 ng/mL (ref 30–100)

## 2019-10-24 LAB — T3: T3, Total: 184 ng/dL — ABNORMAL HIGH (ref 76–181)

## 2019-10-24 NOTE — Telephone Encounter (Signed)
Please advise 

## 2019-10-25 NOTE — Progress Notes (Signed)
Chief Complaint:   OBESITY Tiffany [name] (MR# [id number]) is a 54 y.o. female who presents for evaluation and treatment of obesity and related comorbidities. Current BMI is Body mass index is 36.36 kg/m. Tiffany [name] has been struggling with her weight for many years and has been unsuccessful in either losing weight, maintaining weight loss, or reaching her healthy weight goal.  Tiffany [name] is currently in the action stage of change and ready to dedicate time achieving and maintaining a healthier weight. Tiffany [name] is interested in becoming our patient and working on intensive lifestyle modifications including (but not limited to) diet and exercise for weight loss.  Tiffany [name]'s habits were reviewed today and are as follows: Her family eats meals together, her desired weight loss is 55 lbs, she started gaining weight after her first pregnancy, her heaviest weight ever was 140 pounds, she has significant food cravings issues, she snacks frequently in the evenings, she is frequently drinking liquids with calories, she frequently makes poor food choices, she frequently eats larger portions than normal and she struggles with emotional eating.  Depression Screen Tiffany [name]'s Food and Mood (modified PHQ-9) score was 1.  Depression screen [facility] 2/9 10/20/2019  Decreased Interest 0  Down, Depressed, Hopeless 0  PHQ - 2 Score 0  Altered sleeping 1  Tired, decreased energy 0  Change in appetite 0  Feeling bad or failure about yourself  0  Trouble concentrating 0  Moving slowly or fidgety/restless 0  Suicidal thoughts 0  PHQ-9 Score 1  Difficult doing work/chores Not difficult at all   Subjective:   1. Other fatigue Tiffany [name] admits to daytime somnolence and denies waking up still tired. Patent has a history of symptoms of daytime fatigue. Jaidin generally gets 4 or 5 hours of sleep per night, and states that she has generally restful sleep. Snoring is not present. Apneic episodes are not present.  Epworth Sleepiness Score is 6.  2. SOB (shortness of breath) on exertion Tiffany [name] notes increasing shortness of breath with exercising and seems to be worsening over time with weight gain. She notes getting out of breath sooner with activity than she used to. This has not gotten worse recently. Blandina denies shortness of breath at rest or orthopnea.  3. Essential hypertension Tiffany [name]'s blood pressure is well controlled at her office visit today. She is on metoprolol tartrate 50 mg BID and amlodipine 10 mg q daily, and is tolerating it well.  4. Nonintractable generalized idiopathic epilepsy without status epilepticus ([place]) Tiffany [name] had seizures as and infant when teething and at age 67. She had another episode at age 66 related to stressful relationship. Last seizure activity was 2009 when she was experiencing high levels of stress at work. She has been on lamotrigine 100 mg BID since then.  5. At risk for side effect of medication Tiffany [name] is at risk of drug side effects due to lamotrigine 100 mg BID for treatment of epilepsy, last level checked was within normal limits.  Assessment/Plan:   1. Other fatigue Tiffany [name] does feel that her weight is causing her energy to be lower than it should be. Fatigue may be related to obesity, depression or many other causes. Labs will be ordered, and in the meanwhile, Myrissa will focus on self care including making healthy food choices, increasing physical activity and focusing on stress reduction.  - EKG 12-Lead - Hemoglobin A1c - Insulin, random - VITAMIN D 25 Hydroxy (Vit-D Deficiency, Fractures) - Vitamin B12 - Folate - T3 - T4 - TSH  2. SOB (shortness of breath) on exertion Tiffany [name] does feel that she gets out of breath more easily that she used to when she exercises. Tiffany [name]'s shortness of breath appears to be obesity related and exercise induced. She has agreed to work on weight loss and gradually increase exercise to treat her exercise induced  shortness of breath. Will continue to monitor closely.  - Hemoglobin A1c - Insulin, random - VITAMIN D 25 Hydroxy (Vit-D Deficiency, Fractures) - Vitamin B12 - Folate - T3 - T4 - TSH  3. Essential hypertension Tiffany [name] is working on healthy weight loss and exercise to improve blood pressure control. We will watch for signs of hypotension as she continues her lifestyle modifications.  - Comprehensive metabolic panel - CBC with Differential/Platelet - Lipid Panel With LDL/HDL Ratio  4. Nonintractable generalized idiopathic epilepsy without status epilepticus (Tiffany [name]) Tiffany [name] will continue her current anti-seizure regime. She is to avoid stressful triggers.  5. Depression screening Tiffany [name] had a negative depression screening. Depression is commonly associated with obesity and often results in emotional eating behaviors. We will monitor this closely and work on CBT to help improve the non-hunger eating patterns. Referral to Psychology may be required if no improvement is seen as she continues in our clinic.  6. At risk for side effect of medication Tiffany [name] was given approximately 30 minutes of drug side effect counseling today. We discussed side effect possibility and risk versus benefits. Tiffany [name] agreed to the medication and will contact this office if these side effects are intolerable.  Repetitive spaced learning was employed today to elicit superior memory formation and behavioral change.  7. Class 2 severe obesity with serious comorbidity and body mass index (BMI) of 36.0 to 36.9 in adult, unspecified obesity type (HCC) Tiffany [name] is currently in the action stage of change and her goal is to continue with weight loss efforts. I recommend Tiffany [name] begin the structured treatment plan as follows:  She has agreed to the Category 2 Plan.  Exercise goals: As is.   Behavioral modification strategies: increasing lean protein intake, decreasing simple carbohydrates, decreasing eating out and no  skipping meals.  She was informed of the importance of frequent follow-up visits to maximize her success with intensive lifestyle modifications for her multiple health conditions. She was informed we would discuss her lab results at her next visit unless there is a critical issue that needs to be addressed sooner. Tiffany [name] agreed to keep her next visit at the agreed upon time to discuss these results.  Objective:   Blood pressure 111/74, pulse 73, temperature 98.1 F (36.7 C), temperature source Oral, height 4\' 11"  (1.499 m), weight 180 lb (81.6 kg), SpO2 99 %. Body mass index is 36.36 kg/m.  EKG: Normal sinus rhythm, rate 78 BPM.  Indirect Calorimeter completed today shows a VO2 of 206 and a REE of 1436.  Her calculated basal metabolic rate is [id number] thus her basal metabolic rate is worse than expected.  General: Cooperative, alert, well developed, in no acute distress. HEENT: Conjunctivae and lids unremarkable. Cardiovascular: Regular rhythm.  Lungs: Normal work of breathing. Neurologic: No focal deficits.   Lab Results  Component Value Date   CREATININE 0.76 10/20/2019   BUN 17 10/20/2019   NA 137 10/20/2019   K 4.6 10/20/2019   CL 101 10/20/2019   CO2 29 10/20/2019   Lab Results  Component Value Date   ALT 26 10/20/2019   AST 18 10/20/2019   ALKPHOS 104 01/04/2019   BILITOT 0.5 10/20/2019   Lab  Results  Component Value Date   HGBA1C 5.5 10/20/2019   No results found for: INSULIN Lab Results  Component Value Date   TSH 1.49 10/20/2019   Lab Results  Component Value Date   CHOL 177 10/20/2019   HDL 60 10/20/2019   LDLCALC 97 10/20/2019   TRIG 106 10/20/2019   CHOLHDL 3.0 10/20/2019   Lab Results  Component Value Date   WBC 10.7 (H) 01/04/2019   HGB 11.8 (L) 01/04/2019   HCT 37.8 01/04/2019   MCV 81.8 01/04/2019   PLT 413 (H) 01/04/2019   No results found for: IRON, TIBC, FERRITIN  Attestation Statements:   Reviewed by clinician on day of visit:  allergies, medications, problem list, medical history, surgical history, family history, social history, and previous encounter notes.   I, Trixie Dredge, am acting as transcriptionist for Dennard Nip, MD.  I have reviewed the above documentation for accuracy and completeness, and I agree with the above. - Dennard Nip, MD

## 2019-11-03 ENCOUNTER — Ambulatory Visit (INDEPENDENT_AMBULATORY_CARE_PROVIDER_SITE_OTHER): Payer: 59 | Admitting: Family Medicine

## 2019-11-03 ENCOUNTER — Encounter (INDEPENDENT_AMBULATORY_CARE_PROVIDER_SITE_OTHER): Payer: Self-pay | Admitting: Family Medicine

## 2019-11-03 ENCOUNTER — Other Ambulatory Visit: Payer: Self-pay

## 2019-11-03 VITALS — BP 109/70 | HR 77 | Temp 98.1°F | Ht 59.0 in | Wt 174.0 lb

## 2019-11-03 DIAGNOSIS — Z9189 Other specified personal risk factors, not elsewhere classified: Secondary | ICD-10-CM

## 2019-11-03 DIAGNOSIS — Z6836 Body mass index (BMI) 36.0-36.9, adult: Secondary | ICD-10-CM | POA: Insufficient documentation

## 2019-11-03 DIAGNOSIS — Z6835 Body mass index (BMI) 35.0-35.9, adult: Secondary | ICD-10-CM

## 2019-11-03 DIAGNOSIS — R7303 Prediabetes: Secondary | ICD-10-CM

## 2019-11-03 DIAGNOSIS — I10 Essential (primary) hypertension: Secondary | ICD-10-CM

## 2019-11-03 MED ORDER — METFORMIN HCL 500 MG PO TABS: 500.0000 mg | ORAL_TABLET | Freq: Every morning | ORAL | 0 refills | Status: DC

## 2019-11-03 NOTE — Telephone Encounter (Signed)
Please advise 

## 2019-11-03 NOTE — Progress Notes (Signed)
Chief Complaint:   OBESITY Tiffany [name] is here to discuss her progress with her obesity treatment plan along with follow-up of her obesity related diagnoses. Tiffany [name] is on the Category 2 Plan and states she is following her eating plan approximately 100% of the time. Tiffany [name] states she is doing 0 minutes 0 times per week.  Today's visit was #: 2 Starting weight: 180 lbs Starting date: 10/20/2019 Today's weight: 174 lbs Today's date: 11/03/2019 Total lbs lost to date: 6 Total lbs lost since last in-office visit: 6  Interim History: Tiffany [name] felt that the plan was very easy to follow, but she notes it was just "a little boring". She denies excessive cravings the last two weeks.  Subjective:   1. Pre-diabetes Tiffany [name]'s A1c on 10/20/2019 was 5.5, blood glucose 109, and insulin level 17.1. She is note on metformin. I discussed labs with the patient today.  2. Essential hypertension Tiffany [name]'s blood pressure is well controlled at her office visit today. She is on amlodipine and metoprolol. Her CMP on 10/20/2019 was stable. I discussed labs with the patient today.  3. At risk for diabetes mellitus Tiffany [name] is at higher than average risk for developing diabetes due to her obesity, elevated blood glucose, and elevated insulin.   Assessment/Plan:   1. Pre-diabetes Tiffany [name] will continue her Category 2 meal plan, and will continue to work on weight loss, exercise, and decreasing simple carbohydrates to help decrease the risk of diabetes. Tiffany [name] agreed to start metformin 500 mg with breakfast daily with no refills.  - metFORMIN (GLUCOPHAGE) 500 MG tablet; Take 1 tablet (500 mg total) by mouth every morning.  Dispense: 30 tablet; Refill: 0  2. Essential hypertension Tiffany [name] will continue her Category 2 meal plan, and will continue working on healthy weight loss and exercise to improve blood pressure control. We will watch for signs of hypotension as she continues her lifestyle modifications.  Tiffany [name] will continue her current anti-hypertensive regimen.  3. At risk for diabetes mellitus Tiffany [name] was given approximately 30 minutes of education and counseling today. We discussed intensive lifestyle modifications today with an emphasis on weight loss as well as increasing exercise and decreasing simple carbohydrates in her diet. We also reviewed medication options with an emphasis on risk versus benefit of those discussed.   Repetitive spaced learning was employed today to elicit superior memory formation and behavioral change.  4. Class 2 severe obesity with serious comorbidity and body mass index (BMI) of 35.0 to 35.9 in adult, unspecified obesity type (HCC) Tiffany [name] is currently in the action stage of change. As such, her goal is to continue with weight loss efforts. She has agreed to the Category 2 Plan.   Handouts provided today: Insulin Resistance and Pre-diabetes, and Additional Breakfast Options.  Exercise goals: No exercise has been prescribed at this time.  Behavioral modification strategies: increasing lean protein intake, decreasing simple carbohydrates, no skipping meals, meal planning and cooking strategies and celebration eating strategies.  Tiffany [name] has agreed to follow-up with our clinic in 2 weeks. She was informed of the importance of frequent follow-up visits to maximize her success with intensive lifestyle modifications for her multiple health conditions.   Objective:   Blood pressure 109/70, pulse 77, temperature 98.1 F (36.7 C), temperature source Oral, height 4\' 11"  (1.499 m), weight 174 lb (78.9 kg), SpO2 97 %. Body mass index is 35.14 kg/m.  General: Cooperative, alert, well developed, in no acute distress. HEENT: Conjunctivae and lids unremarkable. Cardiovascular: Regular rhythm.  Lungs: Normal work  of breathing. Neurologic: No focal deficits.   Lab Results  Component Value Date   CREATININE 0.76 10/20/2019   BUN 17 10/20/2019   NA 137 10/20/2019    K 4.6 10/20/2019   CL 101 10/20/2019   CO2 29 10/20/2019   Lab Results  Component Value Date   ALT 26 10/20/2019   AST 18 10/20/2019   ALKPHOS 104 01/04/2019   BILITOT 0.5 10/20/2019   Lab Results  Component Value Date   HGBA1C 5.5 10/20/2019   No results found for: INSULIN Lab Results  Component Value Date   TSH 1.49 10/20/2019   Lab Results  Component Value Date   CHOL 177 10/20/2019   HDL 60 10/20/2019   LDLCALC 97 10/20/2019   TRIG 106 10/20/2019   CHOLHDL 3.0 10/20/2019   Lab Results  Component Value Date   WBC 8.0 10/20/2019   HGB 13.0 10/20/2019   HCT 40.5 10/20/2019   MCV 81.0 10/20/2019   PLT 450 (H) 10/20/2019   No results found for: IRON, TIBC, FERRITIN  Attestation Statements:   Reviewed by clinician on day of visit: allergies, medications, problem list, medical history, surgical history, family history, social history, and previous encounter notes.   I, Trixie Dredge, am acting as transcriptionist for Dennard Nip, MD.  I have reviewed the above documentation for accuracy and completeness, and I agree with the above. -  Dennard Nip, MD

## 2019-11-15 ENCOUNTER — Other Ambulatory Visit: Payer: Self-pay

## 2019-11-15 ENCOUNTER — Encounter: Payer: Self-pay | Admitting: Critical Care Medicine

## 2019-11-15 ENCOUNTER — Ambulatory Visit (INDEPENDENT_AMBULATORY_CARE_PROVIDER_SITE_OTHER): Payer: 59 | Admitting: Critical Care Medicine

## 2019-11-15 VITALS — BP 110/70 | HR 75 | Temp 98.2°F | Ht 59.0 in | Wt 176.2 lb

## 2019-11-15 DIAGNOSIS — R05 Cough: Secondary | ICD-10-CM | POA: Diagnosis not present

## 2019-11-15 DIAGNOSIS — R0602 Shortness of breath: Secondary | ICD-10-CM

## 2019-11-15 DIAGNOSIS — R058 Other specified cough: Secondary | ICD-10-CM

## 2019-11-15 MED ORDER — OMEPRAZOLE 20 MG PO CPDR: 20.0000 mg | DELAYED_RELEASE_CAPSULE | Freq: Every day | ORAL | 0 refills | Status: DC

## 2019-11-15 NOTE — Progress Notes (Signed)
Synopsis: Referred in June 2021 for SOB by Tiffany Latch, MD  Subjective:   PATIENT ID: Tiffany [name] GENDER: female DOB: 12/24/1965, MRN: [id number]  Chief Complaint  Patient presents with   Consult    Patient did PFT 4/9 and it was abnormal. Patient states she has shortness of breath when talking mostly. Dry cough for about the last 6 months. Patient feels like she can't get full breath.    [name] is a 54 y/o woman who presents for evaluation of episodic shortness of breath.  This has been ongoing at random times for about 6 months.  It is not precipitated by anything and resolves on its own.  These episodes last about 1 second when she is talking, giving her the sensation that she cannot complete a full sentence.  This happens 3-4 times per day.  When she breaks out to feel if she can get enough air out.  She has no activity limitations and never has symptoms with activity.  She has no history of allergies or reflux.  She has an occasional dry cough about 2 nights per week when she is lying down.  This is improved since she cleaned the fan that she uses at night, so she attributes this to dust.  She coughs more when she is wearing a mask at work.  She has been changing her diet to try and lose weight through the [place] weight loss program.  She is down 4 pounds in the last month.  She has been sleeping better since starting this.  Her past medical history of chronic seizures has been stable; Lamictal is a chronic medication.  No new medications before this began.  She was previously evaluated by cardiology-Dr. Oval [name].  She had a concerning Lexi scan in November, so reassuring echocardiogram and coronary CT scan.  She had PACs and PVCs with short runs of SVT, for which she was prescribed metoprolol.  Her symptoms were not explained by her cardiac work-up.  PFTs were ordered and referral to pulmonology.       Past Medical History:  Diagnosis Date   Dyspnea     Epilepsy ([place])    Hypertension    Microscopic hematuria 09/25/2016   Had complete work up with Dr. Wendy [name].   Cysto unrevealing,  CT scan nl    Non Hodgkin's lymphoma ([place]) 2/08   stage III   Obesity    Palpitations    Seizure disorder ([place])    Shortness of breath 07/05/2019     Family History  Problem Relation Age of Onset   Breast cancer Mother    Hypertension Mother    COPD Mother    Diabetes Mother    Hepatitis C Father    Diabetes Father    Hypertension Father    Hyperlipidemia Father    Heart disease Father    Stroke Father    Liver disease Father    Alcoholism Father    Colon cancer Father        72 y/o   Hypertension Maternal Grandmother    Hypertension Paternal Grandmother    Colon polyps Neg Hx    Esophageal cancer Neg Hx    Stomach cancer Neg Hx    Rectal cancer Neg Hx      Past Surgical History:  Procedure Laterality Date   CESAREAN SECTION  1998, 2002   [facility] REMOVAL  2009   PORTACATH PLACEMENT  2008    Social History   Socioeconomic History  Marital status: Married    Spouse name: Tiffany [name]   Number of children: 2   Years of education: college   Highest education level: Not on file  Occupational History   Occupation: Engineer, building services: [employer]  Tobacco Use   Smoking status: Never Smoker   Smokeless tobacco: Never Used  Substance and Sexual Activity   Alcohol use: No   Drug use: No   Sexual activity: Yes    Partners: Male  Other Topics Concern   Not on file  Social History Narrative   2 children   Married   Works as a Charity fundraiser   enjoys shopping, movies   Social Determinants of Radio broadcast assistant Strain:    Difficulty of Paying Living Expenses:   Food Insecurity:    Worried About Charity fundraiser in the Last Year:    Arboriculturist in the Last Year:   Transportation Needs:    Film/video editor (Medical):    Lack of Transportation  (Non-Medical):   Physical Activity:    Days of Exercise per Week:    Minutes of Exercise per Session:   Stress:    Feeling of Stress :   Social Connections:    Frequency of Communication with Friends and Family:    Frequency of Social Gatherings with Friends and Family:    Attends Religious Services:    Active Member of Clubs or Organizations:    Attends Archivist Meetings:    Marital Status:   Intimate Partner Violence:    Fear of Current or Ex-Partner:    Emotionally Abused:    Physically Abused:    Sexually Abused:      No Known Allergies   Immunization History  Administered Date(s) Administered   Influenza,inj,Quad PF,6+ Mos 04/06/2013, 04/03/2014, 03/31/2018, 03/21/2019   PPD Test 04/28/2012   Td 06/19/2010    Outpatient Medications Prior to Visit  Medication Sig Dispense Refill   amLODipine (NORVASC) 10 MG tablet Take 1 tablet (10 mg total) by mouth daily. 90 tablet 1   lamoTRIgine (LAMICTAL) 100 MG tablet Take 1 tablet (100 mg total) by mouth 2 (two) times daily. 180 tablet 3   metFORMIN (GLUCOPHAGE) 500 MG tablet Take 1 tablet (500 mg total) by mouth every morning. 30 tablet 0   metoprolol tartrate (LOPRESSOR) 50 MG tablet Take 1 tablet (50 mg total) by mouth 2 (two) times daily. 180 tablet 3   Misc Natural Products (DAILY HERBS IMMUNE DEFENSE PO) Take 2 capsules by mouth daily.     Multiple Vitamins-Minerals (EMERGEN-C IMMUNE PLUS) PACK Take 1 packet by mouth daily.     Multiple Vitamins-Minerals (MULTIVITAMIN WITH MINERALS) tablet Take 1 tablet by mouth daily.     No facility-administered medications prior to visit.    Review of Systems  Constitutional: Negative for chills, fever and malaise/fatigue.  HENT: Negative for congestion.   Respiratory: Positive for cough and shortness of breath. Negative for wheezing.   Cardiovascular: Negative for chest pain, palpitations and leg swelling.  Gastrointestinal: Negative for blood in  stool, heartburn, melena, nausea and vomiting.  Genitourinary: Negative for hematuria.  Musculoskeletal: Negative for joint pain and myalgias.  Skin: Negative for rash.  Neurological: Negative for dizziness and seizures.  Endo/Heme/Allergies: Negative for environmental allergies. Does not bruise/bleed easily.  Psychiatric/Behavioral: The patient does not have insomnia.      Objective:   Vitals:   11/15/19 0902  BP: 110/70  Pulse: 75  Temp: 98.2  F (36.8 C)  TempSrc: Oral  SpO2: 98%  Weight: 176 lb 3.2 oz (79.9 kg)  Height: 4\' 11"  (1.499 m)   98% on   RA BMI Readings from Last 3 Encounters:  11/15/19 35.59 kg/m  11/03/19 35.14 kg/m  10/20/19 36.36 kg/m   Wt Readings from Last 3 Encounters:  11/15/19 176 lb 3.2 oz (79.9 kg)  11/03/19 174 lb (78.9 kg)  10/20/19 180 lb (81.6 kg)    Physical Exam Vitals reviewed.  Constitutional:      General: She is not in acute distress.    Appearance: Normal appearance. She is not ill-appearing.  HENT:     Head: Normocephalic and atraumatic.  Eyes:     General: No scleral icterus. Cardiovascular:     Rate and Rhythm: Normal rate and regular rhythm.     Heart sounds: No murmur.  Pulmonary:     Comments: Breathing comfortably on room air, no conversational dyspnea.  Clear to auscultation bilaterally.  She noted several times during the visit where she thought she could not complete a sentence with 1 breath and alert if needed this, but there is no obvious truncation in her speech. Abdominal:     General: There is no distension.     Palpations: Abdomen is soft.     Tenderness: There is no abdominal tenderness.  Musculoskeletal:        General: No swelling or deformity.     Cervical back: Neck supple.  Lymphadenopathy:     Cervical: No cervical adenopathy.  Skin:    General: Skin is warm and dry.     Findings: No rash.  Neurological:     General: No focal deficit present.     Mental Status: She is alert.     Coordination:  Coordination normal.  Psychiatric:        Mood and Affect: Mood normal.        Behavior: Behavior normal.      CBC    Component Value Date/Time   WBC 8.0 10/20/2019 0846   RBC 5.00 10/20/2019 0846   HGB 13.0 10/20/2019 0846   HCT 40.5 10/20/2019 0846   PLT 450 (H) 10/20/2019 0846   MCV 81.0 10/20/2019 0846   MCH 26.0 (L) 10/20/2019 0846   MCHC 32.1 10/20/2019 0846   RDW 13.1 10/20/2019 0846   LYMPHSABS 2,224 10/20/2019 0846   MONOABS 0.6 01/04/2019 1931   EOSABS 240 10/20/2019 0846   BASOSABS 32 10/20/2019 0846    CHEMISTRY No results for input(s): NA, K, CL, CO2, GLUCOSE, BUN, CREATININE, CALCIUM, MG, PHOS in the last 168 hours. CrCl cannot be calculated (Patient's most recent lab result is older than the maximum 21 days allowed.).   Chest Imaging- films reviewed: CT coronary 07/21/2019, lung images reviewed-no nodules or opacities, left dependent atelectasis.  CXR, 1 view 01/04/2019-normal chest x-ray  Pulmonary Functions Testing Results: PFT Results Latest Ref Rng & Units 09/16/2019  FVC-Pre L 3.07  FVC-Predicted Pre % 131  FVC-Post L 3.05  FVC-Predicted Post % 130  Pre FEV1/FVC % % 78  Post FEV1/FCV % % 81  FEV1-Pre L 2.38  FEV1-Predicted Pre % 127  FEV1-Post L 2.45  DLCO UNC% % 111  DLCO COR %Predicted % 103  TLC L 4.69  TLC % Predicted % 108  RV % Predicted % 102   2021-no significant obstruction or restriction, normal diffusion.  No response to bronchodilators.  Normal PFT.   Echocardiogram 04/20/2019: LVEF 60 to 65%, mildly increased LV  septal wall thickness mild LVH.  Grade 1 diastolic dysfunction.  Normal LA, RV, RA.  Normal valves.  Heart Catheterization 04/20/2019: Abnormal myocardial perfusion consistent with ischemia-reversible defect in the basal inferoseptal, basal inferior, mid inferoseptal, mid inferior and apical inferior location.  Intermediate risk study.  Normal LV function, stress EF 57%.    Assessment & Plan:     ICD-10-CM   1. SOB  (shortness of breath)  R06.02   2. Nocturnal cough  R05     Episodic shortness of breath not associated with activity-with supranormal PFTs and negative cardiac work-up and lack of symptoms with activity, this is unlikely to be pathologic. Nocturnal cough  Question if this could be silent GERD -Trial of PPI for 1 month; if no change in symptoms will discontinue.  If continuing on an acid suppressing regimen long-term, would switch to Pepcid twice daily.  RTC in 3-4 weeks.    Current Outpatient Medications:    amLODipine (NORVASC) 10 MG tablet, Take 1 tablet (10 mg total) by mouth daily., Disp: 90 tablet, Rfl: 1   lamoTRIgine (LAMICTAL) 100 MG tablet, Take 1 tablet (100 mg total) by mouth 2 (two) times daily., Disp: 180 tablet, Rfl: 3   metFORMIN (GLUCOPHAGE) 500 MG tablet, Take 1 tablet (500 mg total) by mouth every morning., Disp: 30 tablet, Rfl: 0   metoprolol tartrate (LOPRESSOR) 50 MG tablet, Take 1 tablet (50 mg total) by mouth 2 (two) times daily., Disp: 180 tablet, Rfl: 3   Misc Natural Products (DAILY HERBS IMMUNE DEFENSE PO), Take 2 capsules by mouth daily., Disp: , Rfl:    Multiple Vitamins-Minerals (EMERGEN-C IMMUNE PLUS) PACK, Take 1 packet by mouth daily., Disp: , Rfl:    Multiple Vitamins-Minerals (MULTIVITAMIN WITH MINERALS) tablet, Take 1 tablet by mouth daily., Disp: , Rfl:    omeprazole (PRILOSEC) 20 MG capsule, Take 1 capsule (20 mg total) by mouth daily., Disp: 30 capsule, Rfl: 0    Julian Hy, DO [facility] 11/15/2019 9:27 AM

## 2019-11-15 NOTE — Patient Instructions (Addendum)
Thank you for visiting Dr. Carlis Abbott at [facility]. We recommend the following:   Meds ordered this encounter  Medications  . omeprazole (PRILOSEC) 20 MG capsule    Sig: Take 1 capsule (20 mg total) by mouth daily.    Dispense:  30 capsule    Refill:  0    Return in about 4 weeks (around 12/13/2019).    Please do your part to reduce the spread of COVID-19.

## 2019-11-21 ENCOUNTER — Ambulatory Visit (INDEPENDENT_AMBULATORY_CARE_PROVIDER_SITE_OTHER): Payer: 59 | Admitting: Adult Health

## 2019-11-21 ENCOUNTER — Encounter (INDEPENDENT_AMBULATORY_CARE_PROVIDER_SITE_OTHER): Payer: Self-pay | Admitting: Adult Health

## 2019-11-21 ENCOUNTER — Other Ambulatory Visit: Payer: Self-pay

## 2019-11-21 VITALS — BP 104/61 | HR 73 | Temp 98.1°F | Ht 59.0 in | Wt 170.0 lb

## 2019-11-21 DIAGNOSIS — Z6834 Body mass index (BMI) 34.0-34.9, adult: Secondary | ICD-10-CM

## 2019-11-21 DIAGNOSIS — I10 Essential (primary) hypertension: Secondary | ICD-10-CM

## 2019-11-21 DIAGNOSIS — R7303 Prediabetes: Secondary | ICD-10-CM | POA: Diagnosis not present

## 2019-11-21 DIAGNOSIS — E669 Obesity, unspecified: Secondary | ICD-10-CM | POA: Insufficient documentation

## 2019-11-21 NOTE — Telephone Encounter (Signed)
Dr. Carlis Abbott, please see pt's mychart message and advise.

## 2019-11-21 NOTE — Progress Notes (Signed)
Chief Complaint:   OBESITY Tiffany [name] is here to discuss her progress with her obesity treatment plan along with follow-up of her obesity related diagnoses. Tiffany [name] is on the Category 2 Plan and states she is following her eating plan approximately 100% of the time. Tiffany [name] states she is exercising for 0 minutes 0 times per week.  Today's visit was #: 3 Starting weight: 180 lbs Starting date: 10/20/2019 Today's weight: 170 lbs Today's date: 11/21/2019 Total lbs lost to date: 10 lbs Total lbs lost since last in-office visit: 4 lbs  Interim History: Tiffany [name] reports increased energy, improved sleep, and decreased diffuse joint pain.  She has been following the plan very consistently, but would like some more flexibility with breakfast.  She was previously given additional breakfast options at her last office visit.  Subjective:   1. Essential hypertension Review: taking medications as instructed, no medication side effects noted, no chest pain on exertion, no dyspnea on exertion, no swelling of ankles.  She denies symptoms of hypotension.  She is on amlodipine 10 mg daily, metoprolol 50 mg twice daily (history of tachycardia).  BP Readings from Last 3 Encounters:  11/21/19 104/61  11/15/19 110/70  11/03/19 109/70   2. Pre-diabetes Tiffany [name] has a diagnosis of prediabetes based on her elevated HgA1c and was informed this puts her at greater risk of developing diabetes. She continues to work on diet and exercise to decrease her risk of diabetes. She denies nausea or hypoglycemia.  Previously elevated blood glucose readings.  Insulin on 10/20/2019, 17.1.  She is on metformin 500 mg with breakfast and is tolerating it well.  She has experienced physiologic hunger after lunch/dinner a few times per week.  Lab Results  Component Value Date   HGBA1C 5.5 10/20/2019   Assessment/Plan:   1. Essential hypertension Tiffany [name] is working on healthy weight loss and exercise to improve blood pressure  control. We will watch for signs of hypotension as she continues her lifestyle modifications.  Tiffany [name] will check blood pressure at home and bring log to follow-up.  Continue Category 2 meal plan.  2. Pre-diabetes Tiffany [name] will continue to work on weight loss, exercise, and decreasing simple carbohydrates to help decrease the risk of diabetes.  If hunger signs remain elevated, will consider increasing metformin.  Continue Category 2 meal plan.  3. Class 1 obesity with serious comorbidity and body mass index (BMI) of 34.0 to 34.9 in adult, unspecified obesity type Tiffany [name] is currently in the action stage of change. As such, her goal is to continue with weight loss efforts. She has agreed to the Category 2 Plan and keeping a food journal and adhering to recommended goals of 200-300 calories and 20+ grams of protein at breakfast.   Exercise goals: No exercise has been prescribed at this time.  Behavioral modification strategies: increasing lean protein intake, increasing vegetables and meal planning and cooking strategies.  Tiffany [name] has agreed to follow-up with our clinic in 2 weeks. She was informed of the importance of frequent follow-up visits to maximize her success with intensive lifestyle modifications for her multiple health conditions.   Objective:   Blood pressure 104/61, pulse 73, temperature 98.1 F (36.7 C), temperature source Oral, height 4\' 11"  (1.499 m), weight 170 lb (77.1 kg), SpO2 100 %. Body mass index is 34.34 kg/m.  General: Cooperative, alert, well developed, in no acute distress. HEENT: Conjunctivae and lids unremarkable. Cardiovascular: Regular rhythm.  Lungs: Normal work of breathing. Neurologic: No focal deficits.   Lab Results  Component Value Date   CREATININE 0.76 10/20/2019   BUN 17 10/20/2019   NA 137 10/20/2019   K 4.6 10/20/2019   CL 101 10/20/2019   CO2 29 10/20/2019   Lab Results  Component Value Date   ALT 26 10/20/2019   AST 18 10/20/2019    ALKPHOS 104 01/04/2019   BILITOT 0.5 10/20/2019   Lab Results  Component Value Date   HGBA1C 5.5 10/20/2019   Lab Results  Component Value Date   TSH 1.49 10/20/2019   Lab Results  Component Value Date   CHOL 177 10/20/2019   HDL 60 10/20/2019   LDLCALC 97 10/20/2019   TRIG 106 10/20/2019   CHOLHDL 3.0 10/20/2019   Lab Results  Component Value Date   WBC 8.0 10/20/2019   HGB 13.0 10/20/2019   HCT 40.5 10/20/2019   MCV 81.0 10/20/2019   PLT 450 (H) 10/20/2019   Attestation Statements:   Reviewed by clinician on day of visit: allergies, medications, problem list, medical history, surgical history, family history, social history, and previous encounter notes.  Time spent on visit including pre-visit chart review and post-visit care and charting was 27 minutes.   I, Water quality scientist, CMA, am acting as Location manager for Mina Marble, NP.  I have reviewed the above documentation for accuracy and completeness, and I agree with the above. -  Esaw Grandchild, NP

## 2019-11-26 ENCOUNTER — Other Ambulatory Visit (INDEPENDENT_AMBULATORY_CARE_PROVIDER_SITE_OTHER): Payer: Self-pay | Admitting: Family Medicine

## 2019-11-26 DIAGNOSIS — R7303 Prediabetes: Secondary | ICD-10-CM

## 2019-11-28 ENCOUNTER — Encounter (INDEPENDENT_AMBULATORY_CARE_PROVIDER_SITE_OTHER): Payer: Self-pay | Admitting: Adult Health

## 2019-12-08 ENCOUNTER — Other Ambulatory Visit: Payer: Self-pay

## 2019-12-08 ENCOUNTER — Encounter (INDEPENDENT_AMBULATORY_CARE_PROVIDER_SITE_OTHER): Payer: Self-pay | Admitting: Bariatrics

## 2019-12-08 ENCOUNTER — Ambulatory Visit (INDEPENDENT_AMBULATORY_CARE_PROVIDER_SITE_OTHER): Payer: 59 | Admitting: Bariatrics

## 2019-12-08 VITALS — BP 111/70 | HR 71 | Temp 98.3°F | Ht 59.0 in | Wt 165.0 lb

## 2019-12-08 DIAGNOSIS — E669 Obesity, unspecified: Secondary | ICD-10-CM

## 2019-12-08 DIAGNOSIS — Z6833 Body mass index (BMI) 33.0-33.9, adult: Secondary | ICD-10-CM | POA: Diagnosis not present

## 2019-12-08 DIAGNOSIS — E559 Vitamin D deficiency, unspecified: Secondary | ICD-10-CM

## 2019-12-08 DIAGNOSIS — K219 Gastro-esophageal reflux disease without esophagitis: Secondary | ICD-10-CM | POA: Diagnosis not present

## 2019-12-08 NOTE — Progress Notes (Signed)
Chief Complaint:   OBESITY Tiffany [name] is here to discuss her progress with her obesity treatment plan along with follow-up of her obesity related diagnoses. Tiffany [name] is on the Category 2 Plan and states she is following her eating plan approximately 100% of the time. [name] states she is exercising 0 minutes 0 times per week.  Today's visit was #: 4 Starting weight: 180 lbs Starting date: 10/20/2019 Today's weight: 165 lbs Today's date: 12/08/2019 Total lbs lost to date: 15 Total lbs lost since last in-office visit: 5  Interim History: Tiffany [name] is down 5 lbs and doing well overall. She reports doing well with her water and protein intake.  Subjective:   Vitamin D deficiency. Tiffany [name] is taking a multivitamin. No nausea, vomiting, or muscle weakness.    Ref. Range 10/20/2019 08:46  Vitamin D, 25-Hydroxy Latest Ref Range: 30 - 100 ng/mL 45   Gastroesophageal reflux disease, unspecified whether esophagitis present. Tiffany [name] is taking Prilosec.  Assessment/Plan:   Vitamin D deficiency. Low Vitamin D level contributes to fatigue and are associated with obesity, breast, and colon cancer. She agrees to continue to take her multivitamin as directed and will follow-up for routine testing of Vitamin D, at least 2-3 times per year to avoid over-replacement.  Gastroesophageal reflux disease, unspecified whether esophagitis present. Intensive lifestyle modifications are the first line treatment for this issue. We discussed several lifestyle modifications today and she will continue to work on diet, exercise and weight loss efforts. Orders and follow up as documented in patient record. [name] will continue her medication as directed.   Counseling . If a person has gastroesophageal reflux disease (GERD), food and stomach acid move back up into the esophagus and cause symptoms or problems such as damage to the esophagus. . Anti-reflux measures include: raising the head of the bed,  avoiding tight clothing or belts, avoiding eating late at night, not lying down shortly after mealtime, and achieving weight loss. . Avoid ASA, NSAID's, caffeine, alcohol, and tobacco.  . OTC Pepcid and/or Tums are often very helpful for as needed use.  [name] However, for persisting chronic or daily symptoms, stronger medications like Omeprazole may be needed. . You may need to avoid foods and drinks such as: ? Coffee and tea (with or without caffeine). ? Drinks that contain alcohol. ? Energy drinks and sports drinks. ? Bubbly (carbonated) drinks or sodas. ? Chocolate and cocoa. ? Peppermint and mint flavorings. ? Garlic and onions. ? Horseradish. ? Spicy and acidic foods. These include peppers, chili powder, curry powder, vinegar, hot sauces, and BBQ sauce. ? Citrus fruit juices and citrus fruits, such as oranges, lemons, and limes. ? Tomato-based foods. These include red sauce, chili, salsa, and pizza with red sauce. ? Fried and fatty foods. These include donuts, french fries, potato chips, and high-fat dressings. ? High-fat meats. These include hot dogs, rib eye steak, sausage, ham, and bacon.  Class 1 obesity with serious comorbidity and body mass index (BMI) of 33.0 to 33.9 in adult, unspecified obesity type.  [name] is currently in the action stage of change. As such, her goal is to continue with weight loss efforts. She has agreed to the Category 2 Plan alternating with the Enders.   She will work on meal planning and intentional eating.   Exercise goals: All adults should avoid inactivity. Some physical activity is better than none, and adults who participate in any amount of physical activity gain some health benefits.  Behavioral modification strategies: increasing lean  protein intake, decreasing simple carbohydrates, increasing vegetables, increasing water intake, decreasing eating out, no skipping meals, meal planning and cooking strategies, keeping healthy foods in the  home and planning for success.  Tiffany [name] has agreed to follow-up with our clinic in 2 weeks. She was informed of the importance of frequent follow-up visits to maximize her success with intensive lifestyle modifications for her multiple health conditions.   Objective:   Blood pressure 111/70, pulse 71, temperature 98.3 F (36.8 C), height 4\' 11"  (1.499 m), weight 165 lb (74.8 kg), SpO2 99 %. Body mass index is 33.33 kg/m.  General: Cooperative, alert, well developed, in no acute distress. HEENT: Conjunctivae and lids unremarkable. Cardiovascular: Regular rhythm.  Lungs: Normal work of breathing. Neurologic: No focal deficits.   Lab Results  Component Value Date   CREATININE 0.76 10/20/2019   BUN 17 10/20/2019   NA 137 10/20/2019   K 4.6 10/20/2019   CL 101 10/20/2019   CO2 29 10/20/2019   Lab Results  Component Value Date   ALT 26 10/20/2019   AST 18 10/20/2019   ALKPHOS 104 01/04/2019   BILITOT 0.5 10/20/2019   Lab Results  Component Value Date   HGBA1C 5.5 10/20/2019   No results found for: INSULIN Lab Results  Component Value Date   TSH 1.49 10/20/2019   Lab Results  Component Value Date   CHOL 177 10/20/2019   HDL 60 10/20/2019   LDLCALC 97 10/20/2019   TRIG 106 10/20/2019   CHOLHDL 3.0 10/20/2019   Lab Results  Component Value Date   WBC 8.0 10/20/2019   HGB 13.0 10/20/2019   HCT 40.5 10/20/2019   MCV 81.0 10/20/2019   PLT 450 (H) 10/20/2019   No results found for: IRON, TIBC, FERRITIN  Attestation Statements:   Reviewed by clinician on day of visit: allergies, medications, problem list, medical history, surgical history, family history, social history, and previous encounter notes.  Time spent on visit including pre-visit chart review and post-visit charting and care was 20 minutes.   Migdalia Dk, am acting as Location manager for [employer], DO   I have reviewed the above documentation for accuracy and completeness, and I agree with the  above. Jearld Lesch, DO

## 2019-12-09 ENCOUNTER — Other Ambulatory Visit: Payer: Self-pay | Admitting: Critical Care Medicine

## 2019-12-12 ENCOUNTER — Encounter (INDEPENDENT_AMBULATORY_CARE_PROVIDER_SITE_OTHER): Payer: Self-pay | Admitting: Adult Health

## 2019-12-13 NOTE — Telephone Encounter (Signed)
Please advise 

## 2019-12-20 ENCOUNTER — Other Ambulatory Visit (INDEPENDENT_AMBULATORY_CARE_PROVIDER_SITE_OTHER): Payer: Self-pay | Admitting: Family Medicine

## 2019-12-20 ENCOUNTER — Ambulatory Visit: Payer: 59 | Admitting: Critical Care Medicine

## 2019-12-20 DIAGNOSIS — R7303 Prediabetes: Secondary | ICD-10-CM

## 2019-12-27 ENCOUNTER — Ambulatory Visit (INDEPENDENT_AMBULATORY_CARE_PROVIDER_SITE_OTHER): Payer: 59 | Admitting: Adult Health

## 2019-12-28 ENCOUNTER — Encounter (INDEPENDENT_AMBULATORY_CARE_PROVIDER_SITE_OTHER): Payer: Self-pay | Admitting: Adult Health

## 2019-12-28 ENCOUNTER — Other Ambulatory Visit: Payer: Self-pay

## 2019-12-28 ENCOUNTER — Ambulatory Visit (INDEPENDENT_AMBULATORY_CARE_PROVIDER_SITE_OTHER): Payer: 59 | Admitting: Adult Health

## 2019-12-28 VITALS — BP 122/83 | HR 72 | Temp 98.1°F | Ht 59.0 in | Wt 160.0 lb

## 2019-12-28 DIAGNOSIS — Z9189 Other specified personal risk factors, not elsewhere classified: Secondary | ICD-10-CM | POA: Diagnosis not present

## 2019-12-28 DIAGNOSIS — I10 Essential (primary) hypertension: Secondary | ICD-10-CM

## 2019-12-28 DIAGNOSIS — E669 Obesity, unspecified: Secondary | ICD-10-CM

## 2019-12-28 DIAGNOSIS — R7303 Prediabetes: Secondary | ICD-10-CM | POA: Diagnosis not present

## 2019-12-28 DIAGNOSIS — Z6832 Body mass index (BMI) 32.0-32.9, adult: Secondary | ICD-10-CM

## 2019-12-28 MED ORDER — METFORMIN HCL 500 MG PO TABS: 500.0000 mg | ORAL_TABLET | Freq: Two times a day (BID) | ORAL | 0 refills | Status: DC

## 2019-12-28 NOTE — Progress Notes (Signed)
Chief Complaint:   OBESITY Tiffany [name] is here to discuss her progress with her obesity treatment plan along with follow-up of her obesity related diagnoses. Tiffany [name] is on the Category 2 Plan and states she is following her eating plan approximately 90-95% of the time. Carloyn states she is exercising 0 minutes 0 times per week.  Today's visit was #: 5 Starting weight: 180 lbs Starting date: 10/20/2019 Today's weight: 160 lbs Today's date: 12/28/2019 Total lbs lost to date: 20 Total lbs lost since last in-office visit: 5  Interim History: Tiffany [name] states "I feel awesome, just awesome." Her 79 year old mother experienced an acute health crisis which required Jamell to travel to [place] for 12 days to get her stabilized. She tried to stay on plan by focusing on protein at meals and using [employer] microwave meals for dinner.   Subjective:   Essential hypertension. Blood pressure is at goal on today's office visit. Tiffany [name] is on amlodipine 10 mg daily and metoprolol 50 mg BID.  BP Readings from Last 3 Encounters:  12/28/19 122/83  12/08/19 111/70  11/21/19 104/61   Lab Results  Component Value Date   CREATININE 0.76 10/20/2019   CREATININE 0.86 07/19/2019   CREATININE 0.84 01/04/2019   Pre-diabetes. Tiffany [name] has a diagnosis of prediabetes based on her elevated HgA1c and was informed this puts her at greater risk of developing diabetes. She continues to work on diet and exercise to decrease her risk of diabetes. She denies nausea or hypoglycemia. Tiffany [name] is on metformin 500 mg at breakfast. Despite this prescription and eating on plan, she has been experiencing afternoon polyphagia. Last CMP was stable.  Lab Results  Component Value Date   HGBA1C 5.5 10/20/2019   No results found for: INSULIN  At risk for diarrhea. Brayleigh is at higher risk of diarrhea due to increase in metformin dose to treat afternoon polyphagia.  Assessment/Plan:   Essential  hypertension. Marcina is working on healthy weight loss and exercise to improve blood pressure control. We will watch for signs of hypotension as she continues her lifestyle modifications. She will continue her current anti-hypertensive regimen. Will check labs in mid-August.  Pre-diabetes. Tiffany [name] will continue to work on weight loss, exercise, and decreasing simple carbohydrates to help decrease the risk of diabetes. She will increase metFORMIN (GLUCOPHAGE) to 500 MG tablet BID with breakfast and lunch, #60 with 0 refill. Labs will be checked in mid-August.  At risk for diarrhea. Tiffany [name] was given approximately 15 minutes of diarrhea prevention counseling today. She is 54 y.o. female and has risk factors for diarrhea including medications and changes in diet. We discussed intensive lifestyle modifications today with an emphasis on specific weight loss instructions including dietary strategies.   Repetitive spaced learning was employed today to elicit superior memory formation and behavioral change.  Class 1 obesity with serious comorbidity and body mass index (BMI) of 32.0 to 32.9 in adult, unspecified obesity type.  Tiffany [name] is currently in the action stage of change. As such, her goal is to continue with weight loss efforts. She has agreed to the Category 2 Plan.   Exercise goals: None  Behavioral modification strategies: increasing lean protein intake, increasing water intake, meal planning and cooking strategies and planning for success.  Tiffany [name] has agreed to follow-up with our clinic in 2 weeks. She was informed of the importance of frequent follow-up visits to maximize her success with intensive lifestyle modifications for her multiple health conditions.   Objective:   Blood pressure 122/83,  pulse 72, temperature 98.1 F (36.7 C), temperature source Oral, height 4\' 11"  (1.499 m), weight 160 lb (72.6 kg), SpO2 98 %. Body mass index is 32.32 kg/m.  General: Cooperative, alert, well  developed, in no acute distress. HEENT: Conjunctivae and lids unremarkable. Cardiovascular: Regular rhythm.  Lungs: Normal work of breathing. Neurologic: No focal deficits.   Lab Results  Component Value Date   CREATININE 0.76 10/20/2019   BUN 17 10/20/2019   NA 137 10/20/2019   K 4.6 10/20/2019   CL 101 10/20/2019   CO2 29 10/20/2019   Lab Results  Component Value Date   ALT 26 10/20/2019   AST 18 10/20/2019   ALKPHOS 104 01/04/2019   BILITOT 0.5 10/20/2019   Lab Results  Component Value Date   HGBA1C 5.5 10/20/2019   No results found for: INSULIN Lab Results  Component Value Date   TSH 1.49 10/20/2019   Lab Results  Component Value Date   CHOL 177 10/20/2019   HDL 60 10/20/2019   LDLCALC 97 10/20/2019   TRIG 106 10/20/2019   CHOLHDL 3.0 10/20/2019   Lab Results  Component Value Date   WBC 8.0 10/20/2019   HGB 13.0 10/20/2019   HCT 40.5 10/20/2019   MCV 81.0 10/20/2019   PLT 450 (H) 10/20/2019   No results found for: IRON, TIBC, FERRITIN  Attestation Statements:   Reviewed by clinician on day of visit: allergies, medications, problem list, medical history, surgical history, family history, social history, and previous encounter notes.  I, Michaelene Song, am acting as Location manager for [employer], NP-C   I have reviewed the above documentation for accuracy and completeness, and I agree with the above. -  Esaw Grandchild, NP

## 2020-01-01 ENCOUNTER — Encounter (INDEPENDENT_AMBULATORY_CARE_PROVIDER_SITE_OTHER): Payer: Self-pay | Admitting: Adult Health

## 2020-01-02 ENCOUNTER — Other Ambulatory Visit (INDEPENDENT_AMBULATORY_CARE_PROVIDER_SITE_OTHER): Payer: Self-pay

## 2020-01-02 DIAGNOSIS — R7303 Prediabetes: Secondary | ICD-10-CM

## 2020-01-02 MED ORDER — METFORMIN HCL 500 MG PO TABS: 500.0000 mg | ORAL_TABLET | Freq: Two times a day (BID) | ORAL | 0 refills | Status: DC

## 2020-01-03 ENCOUNTER — Other Ambulatory Visit: Payer: Self-pay | Admitting: Critical Care Medicine

## 2020-01-18 ENCOUNTER — Encounter (INDEPENDENT_AMBULATORY_CARE_PROVIDER_SITE_OTHER): Payer: Self-pay | Admitting: Adult Health

## 2020-01-18 ENCOUNTER — Other Ambulatory Visit: Payer: Self-pay

## 2020-01-18 ENCOUNTER — Ambulatory Visit (INDEPENDENT_AMBULATORY_CARE_PROVIDER_SITE_OTHER): Payer: 59 | Admitting: Adult Health

## 2020-01-18 VITALS — BP 117/74 | HR 64 | Temp 97.8°F | Ht 59.0 in | Wt 162.0 lb

## 2020-01-18 DIAGNOSIS — I10 Essential (primary) hypertension: Secondary | ICD-10-CM | POA: Diagnosis not present

## 2020-01-18 DIAGNOSIS — E669 Obesity, unspecified: Secondary | ICD-10-CM

## 2020-01-18 DIAGNOSIS — Z6832 Body mass index (BMI) 32.0-32.9, adult: Secondary | ICD-10-CM

## 2020-01-18 DIAGNOSIS — R7303 Prediabetes: Secondary | ICD-10-CM | POA: Diagnosis not present

## 2020-01-18 NOTE — Progress Notes (Signed)
Chief Complaint:   OBESITY Tiffany [name] is here to discuss her progress with her obesity treatment plan along with follow-up of her obesity related diagnoses. Tiffany [name] is on the Category 2 Plan and states she is following her eating plan approximately 98% of the time. Tiffany [name] states she is exercising 0 minutes 0 times per week.  Today's visit was #: 6 Starting weight: 180 lbs Starting date: 10/20/2019 Today's weight: 162 lbs Today's date: 01/18/2020 Total lbs lost to date: 18 Total lbs lost since last in-office visit: 0  Interim History: Tiffany [name] continues to enjoy breakfast and lunch on the Category 2 meal plan and is starting to bore with dinner and would like to discuss other options. She recently had her COVID-19 vaccine - great! She denies excessive cravings or polyphagia when eating on plan.  Subjective:   Essential hypertension. Blood pressure is excellent at today's office visit. Tiffany [name] is on amlodipine 10 mg daily and metoprolol tartrate 50 mg BID. She denies chest pain, dyspnea, or dizziness with position changes.  BP Readings from Last 3 Encounters:  12/28/19 122/83  12/08/19 111/70  11/21/19 104/61   Lab Results  Component Value Date   CREATININE 0.76 10/20/2019   CREATININE 0.86 07/19/2019   CREATININE 0.84 01/04/2019   Pre-diabetes. Tiffany [name] has a diagnosis of prediabetes based on her elevated HgA1c and was informed this puts her at greater risk of developing diabetes. She continues to work on diet and exercise to decrease her risk of diabetes. She denies nausea or hypoglycemia. Tiffany [name] is on metformin 500 mg with breakfast and lunch. She denies GI upset and polyphagia.  Lab Results  Component Value Date   HGBA1C 5.5 10/20/2019   No results found for: INSULIN  Assessment/Plan:   Essential hypertension. Tiffany [name] is working on healthy weight loss and exercise to improve blood pressure control. We will watch for signs of hypotension as she continues her  lifestyle modifications. Will check blood pressure 1-2 times a week or if symptoms of hypotension develop. She will continue calcium channel blocker and beta blocker as directed. Labs will be checked at her next office visit.  Pre-diabetes. Tiffany [name] will continue to work on weight loss, exercise, and decreasing simple carbohydrates to help decrease the risk of diabetes. She will continue metformin as directed. Labs will be checked at her next office visit.  Class 1 obesity with serious comorbidity and body mass index (BMI) of 32.0 to 32.9 in adult, unspecified obesity type.  Tiffany [name] is currently in the action stage of change. As such, her goal is to continue with weight loss efforts. She has agreed to the Category 2 Plan and will journal 400-500 calories and 35+ grams of protein at supper.   Exercise goals: No exercise has been prescribed at this time.  Behavioral modification strategies: increasing lean protein intake, meal planning and cooking strategies, planning for success and keeping a strict food journal.  Tiffany [name] has agreed to follow-up with our clinic fasting in 2 weeks. She was informed of the importance of frequent follow-up visits to maximize her success with intensive lifestyle modifications for her multiple health conditions.   Objective:   Pulse 74, temperature 97.8 F (36.6 C), temperature source Oral, height 4\' 11"  (1.499 m), weight 162 lb (73.5 kg), SpO2 98 %. Body mass index is 32.72 kg/m.  General: Cooperative, alert, well developed, in no acute distress. HEENT: Conjunctivae and lids unremarkable. Cardiovascular: Regular rhythm.  Lungs: Normal work of breathing. Neurologic: No focal deficits.   Lab  Results  Component Value Date   CREATININE 0.76 10/20/2019   BUN 17 10/20/2019   NA 137 10/20/2019   K 4.6 10/20/2019   CL 101 10/20/2019   CO2 29 10/20/2019   Lab Results  Component Value Date   ALT 26 10/20/2019   AST 18 10/20/2019   ALKPHOS 104 01/04/2019    BILITOT 0.5 10/20/2019   Lab Results  Component Value Date   HGBA1C 5.5 10/20/2019   No results found for: INSULIN Lab Results  Component Value Date   TSH 1.49 10/20/2019   Lab Results  Component Value Date   CHOL 177 10/20/2019   HDL 60 10/20/2019   LDLCALC 97 10/20/2019   TRIG 106 10/20/2019   CHOLHDL 3.0 10/20/2019   Lab Results  Component Value Date   WBC 8.0 10/20/2019   HGB 13.0 10/20/2019   HCT 40.5 10/20/2019   MCV 81.0 10/20/2019   PLT 450 (H) 10/20/2019   No results found for: IRON, TIBC, FERRITIN  Attestation Statements:   Reviewed by clinician on day of visit: allergies, medications, problem list, medical history, surgical history, family history, social history, and previous encounter notes.  Time spent on visit including pre-visit chart review and post-visit charting and care was 27 minutes.   I, Michaelene Song, am acting as Location manager for [employer], NP-C   I have reviewed the above documentation for accuracy and completeness, and I agree with the above. -  Esaw Grandchild, NP

## 2020-01-27 ENCOUNTER — Encounter: Payer: 59 | Admitting: Family

## 2020-02-07 ENCOUNTER — Other Ambulatory Visit: Payer: Self-pay

## 2020-02-07 ENCOUNTER — Ambulatory Visit (INDEPENDENT_AMBULATORY_CARE_PROVIDER_SITE_OTHER): Payer: 59 | Admitting: Adult Health

## 2020-02-07 ENCOUNTER — Encounter (INDEPENDENT_AMBULATORY_CARE_PROVIDER_SITE_OTHER): Payer: Self-pay | Admitting: Adult Health

## 2020-02-07 VITALS — BP 105/66 | HR 64 | Temp 98.3°F | Ht 59.0 in | Wt 155.0 lb

## 2020-02-07 DIAGNOSIS — Z9189 Other specified personal risk factors, not elsewhere classified: Secondary | ICD-10-CM | POA: Diagnosis not present

## 2020-02-07 DIAGNOSIS — I10 Essential (primary) hypertension: Secondary | ICD-10-CM | POA: Diagnosis not present

## 2020-02-07 DIAGNOSIS — R7303 Prediabetes: Secondary | ICD-10-CM

## 2020-02-07 DIAGNOSIS — Z6831 Body mass index (BMI) 31.0-31.9, adult: Secondary | ICD-10-CM

## 2020-02-07 DIAGNOSIS — E559 Vitamin D deficiency, unspecified: Secondary | ICD-10-CM | POA: Diagnosis not present

## 2020-02-07 DIAGNOSIS — E669 Obesity, unspecified: Secondary | ICD-10-CM

## 2020-02-07 NOTE — Progress Notes (Signed)
Chief Complaint:   OBESITY Tiffany [name] is here to discuss her progress with her obesity treatment plan along with follow-up of her obesity related diagnoses. Tiffany [name] is on the Category 2 Plan and states she is following her eating plan approximately 98% of the time. Tiffany [name] states she is exercising 0 minutes 0 times per week.  Today's visit was #: 7 Starting weight: 180 lbs Starting date: 10/20/2019 Today's weight: 155 lbs Today's date: 02/07/2020 Total lbs lost to date: 25 Total lbs lost since last in-office visit: 7  Interim History: Tiffany [name] is now fully vaccinated with her second dose of [place] being received on 02/03/2020. She denies any immunization side effects. She continues to enjoy the foods and structure of the Category 2 meal plan and has been increasing her daily fluid intake. She recently had comprehensive labs at biometric screening with her employer and results were reviewed with her today. She will bring in hard copy to her next office visit.  Subjective:   Essential hypertension. Blood pressure is a little soft today, however, she denies symptoms of hypotension. Tiffany [name] does not check her blood pressure at home.  She is on Amlodipine 10mg  QD and Metoprolol 50mg  BID.  She is followed by [facility]/Dr. Oval Linsey.  BP Readings from Last 3 Encounters:  02/07/20 105/66  01/18/20 117/74  12/28/19 122/83   Lab Results  Component Value Date   CREATININE 0.76 10/20/2019   CREATININE 0.86 07/19/2019   CREATININE 0.84 01/04/2019   Prediabetes. Tiffany [name] has a diagnosis of prediabetes based on her elevated HgA1c and was informed this puts her at greater risk of developing diabetes. She continues to work on diet and exercise to decrease her risk of diabetes. She denies nausea or hypoglycemia. Labs were completed at work on 01/30/2020 for work biometric screening and showed an A1c of 5.4. She is on metformin 500 mg BID and denies GI upset or polyphagia. CMP was stable on  01/30/2020.  Lab Results  Component Value Date   HGBA1C 5.5 10/20/2019   No results found for: INSULIN  Vitamin D deficiency. External labs on 01/30/2020 showed a Vitamin D level of 63. Tiffany [name] is on a multivitamin and denies nausea, vomiting, or muscle weakness.    Ref. Range 10/20/2019 08:46  Vitamin D, 25-Hydroxy Latest Ref Range: 30 - 100 ng/mL 45   At risk for complication associated with hypotension. The patient is at a higher than average risk of hypotension due to steady weight loss. She denies symptoms of hypotension.  Assessment/Plan:   Essential hypertension. Tiffany [name] is working on healthy weight loss and exercise to improve blood pressure control. We will watch for signs of hypotension as she continues her lifestyle modifications. She will check her blood pressure at home and watch for signs of hypotension and is to contact her cardiologist, Dr. Oval Linsey, if any signs develop. She was instructed to increase her daily water intake.  Prediabetes. Tiffany [name] will continue to work on weight loss, exercise, and decreasing simple carbohydrates to help decrease the risk of diabetes. 01/30/20 A1c-5.4,  with employer Biometric screening.  Insulin-random will be checked. [facility] requisition was printed and given to the patient. She will continue her metformin as directed and continue to follow the Category 2 meal plan.  Vitamin D deficiency. Low Vitamin D level contributes to fatigue and are associated with obesity, breast, and colon cancer. She agrees to continue to take OTC multivitamin as directed and will follow-up for routine testing of Vitamin D, at least 2-3 times  per year to avoid over-replacement.  At risk for complication associated with hypotension. Tiffany [name] was given approximately 15 minutes of education and counseling today to help avoid hypotension. We discussed risks of hypotension with weight loss and signs of hypotension such as feeling lightheaded or unsteady.  Repetitive spaced  learning was employed today to elicit superior memory formation and behavioral change.  Class 1 obesity with serious comorbidity and body mass index (BMI) of 31.0 to 31.9 in adult, unspecified obesity type.  Tiffany [name] is currently in the action stage of change. As such, her goal is to continue with weight loss efforts. She has agreed to the Category 2 Plan.   Exercise goals: No exercise has been prescribed at this time.  Behavioral modification strategies: increasing lean protein intake, increasing water intake, meal planning and cooking strategies and planning for success.  Tiffany [name] has agreed to follow-up with our clinic in 2 weeks. She was informed of the importance of frequent follow-up visits to maximize her success with intensive lifestyle modifications for her multiple health conditions.   Objective:   Blood pressure 105/66, pulse 64, temperature 98.3 F (36.8 C), temperature source Oral, height 4\' 11"  (1.499 m), weight 155 lb (70.3 kg), SpO2 99 %. Body mass index is 31.31 kg/m.  General: Cooperative, alert, well developed, in no acute distress. HEENT: Conjunctivae and lids unremarkable. Cardiovascular: Regular rhythm.  Lungs: Normal work of breathing. Neurologic: No focal deficits.   Lab Results  Component Value Date   CREATININE 0.76 10/20/2019   BUN 17 10/20/2019   NA 137 10/20/2019   K 4.6 10/20/2019   CL 101 10/20/2019   CO2 29 10/20/2019   Lab Results  Component Value Date   ALT 26 10/20/2019   AST 18 10/20/2019   ALKPHOS 104 01/04/2019   BILITOT 0.5 10/20/2019   Lab Results  Component Value Date   HGBA1C 5.5 10/20/2019   No results found for: INSULIN Lab Results  Component Value Date   TSH 1.49 10/20/2019   Lab Results  Component Value Date   CHOL 177 10/20/2019   HDL 60 10/20/2019   LDLCALC 97 10/20/2019   TRIG 106 10/20/2019   CHOLHDL 3.0 10/20/2019   Lab Results  Component Value Date   WBC 8.0 10/20/2019   HGB 13.0 10/20/2019   HCT 40.5  10/20/2019   MCV 81.0 10/20/2019   PLT 450 (H) 10/20/2019   No results found for: IRON, TIBC, FERRITIN  Attestation Statements:   Reviewed by clinician on day of visit: allergies, medications, problem list, medical history, surgical history, family history, social history, and previous encounter notes.  I, Michaelene Song, am acting as Location manager for [employer], NP-C   I have reviewed the above documentation for accuracy and completeness, and I agree with the above. -  Esaw Grandchild, NP

## 2020-02-08 LAB — INSULIN, RANDOM: Insulin: 5.9 u[IU]/mL

## 2020-02-11 ENCOUNTER — Encounter (INDEPENDENT_AMBULATORY_CARE_PROVIDER_SITE_OTHER): Payer: Self-pay | Admitting: Adult Health

## 2020-02-11 ENCOUNTER — Other Ambulatory Visit: Payer: Self-pay | Admitting: Family

## 2020-02-11 DIAGNOSIS — I10 Essential (primary) hypertension: Secondary | ICD-10-CM

## 2020-02-17 ENCOUNTER — Other Ambulatory Visit (HOSPITAL_COMMUNITY)
Admission: RE | Admit: 2020-02-17 | Discharge: 2020-02-17 | Disposition: A | Discharge status: Home / Self Care | Payer: 59 | Source: Ambulatory Visit | Attending: Family | Admitting: Family

## 2020-02-17 ENCOUNTER — Ambulatory Visit (INDEPENDENT_AMBULATORY_CARE_PROVIDER_SITE_OTHER): Payer: 59 | Admitting: Family

## 2020-02-17 ENCOUNTER — Encounter: Payer: Self-pay | Admitting: Family

## 2020-02-17 ENCOUNTER — Other Ambulatory Visit: Payer: Self-pay

## 2020-02-17 VITALS — BP 113/59 | HR 89 | Temp 98.9°F | Resp 16 | Ht 59.3 in | Wt 160.0 lb

## 2020-02-17 DIAGNOSIS — Z01419 Encounter for gynecological examination (general) (routine) without abnormal findings: Secondary | ICD-10-CM | POA: Diagnosis present

## 2020-02-17 DIAGNOSIS — Z0000 Encounter for general adult medical examination without abnormal findings: Secondary | ICD-10-CM

## 2020-02-17 NOTE — Progress Notes (Signed)
Subjective:    Patient ID: Tiffany [name], female    DOB: 1965-10-11, 54 y.o.   MRN: [id number]  HPI  Patient presents today for complete physical.  Immunizations:   covid up to date, declines flu shot Diet: healthy- has been losing weight Wt Readings from Last 3 Encounters:  02/17/20 160 lb (72.6 kg)  02/07/20 155 lb (70.3 kg)  01/18/20 162 lb (73.5 kg)  Colonoscopy: due Pap Smear: 08/08/15 Mammogram:  08/15/2019  HTN-  BP Readings from Last 3 Encounters:  02/17/20 (!) 113/59  02/07/20 105/66  01/18/20 117/74       Review of Systems  Constitutional: Negative for unexpected weight change.  HENT: Negative for hearing loss and rhinorrhea.   Eyes: Negative for visual disturbance.  Respiratory: Negative for cough and shortness of breath.   Cardiovascular: Negative for chest pain.  Gastrointestinal: Negative for constipation and diarrhea.  Genitourinary: Negative for dysuria, frequency and menstrual problem.  Musculoskeletal: Negative for arthralgias and myalgias.  Neurological: Negative for headaches.  Hematological: Negative for adenopathy.  Psychiatric/Behavioral: Negative for dysphoric mood.       Denies depression/anxiety    Past Medical History:  Diagnosis Date  . Dyspnea   . Epilepsy ([place])   . Hypertension   . Microscopic hematuria 09/25/2016   Had complete work up with Dr. Wendy Poet.   Cysto unrevealing,  CT scan nl   . Non Hodgkin's lymphoma ([place]) 2/08   stage III  . Obesity   . Palpitations   . Seizure disorder ([place])   . Shortness of breath 07/05/2019     Social History   Socioeconomic History  . Marital status: Married    Spouse name: Iona Beard  . Number of children: 2  . Years of education: college  . Highest education level: Not on file  Occupational History  . Occupation: Engineer, building services: [employer]  Tobacco Use  . Smoking status: Never Smoker  . Smokeless tobacco: Never Used  Vaping Use  . Vaping Use: Never used  Substance  and Sexual Activity  . Alcohol use: No  . Drug use: No  . Sexual activity: Yes    Partners: Male  Other Topics Concern  . Not on file  Social History Narrative   2 children   Married   Works as a Curator, movies   Social Determinants of Radio broadcast assistant Strain:   . Difficulty of Paying Living Expenses: Not on file  Food Insecurity:   . Worried About Charity fundraiser in the Last Year: Not on file  . Ran Out of Food in the Last Year: Not on file  Transportation Needs:   . Lack of Transportation (Medical): Not on file  . Lack of Transportation (Non-Medical): Not on file  Physical Activity:   . Days of Exercise per Week: Not on file  . Minutes of Exercise per Session: Not on file  Stress:   . Feeling of Stress : Not on file  Social Connections:   . Frequency of Communication with Friends and Family: Not on file  . Frequency of Social Gatherings with Friends and Family: Not on file  . Attends Religious Services: Not on file  . Active Member of Clubs or Organizations: Not on file  . Attends Archivist Meetings: Not on file  . Marital Status: Not on file  Intimate Partner Violence:   . Fear of Current or Ex-Partner: Not on file  . Emotionally Abused:  Not on file  . Physically Abused: Not on file  . Sexually Abused: Not on file    Past Surgical History:  Procedure Laterality Date  . [place], 2002  . PORT-A-CATH REMOVAL  2009  . PORTACATH PLACEMENT  2008    Family History  Problem Relation Age of Onset  . Breast cancer Mother   . Hypertension Mother   . COPD Mother   . Diabetes Mother   . Hepatitis C Father   . Diabetes Father   . Hypertension Father   . Hyperlipidemia Father   . Heart disease Father   . Stroke Father   . Liver disease Father   . Alcoholism Father   . Colon cancer Father        68 y/o  . Hypertension Maternal Grandmother   . Hypertension Paternal Grandmother   . Colon polyps Neg Hx    . Esophageal cancer Neg Hx   . Stomach cancer Neg Hx   . Rectal cancer Neg Hx     No Known Allergies  Current Outpatient Medications on File Prior to Visit  Medication Sig Dispense Refill  . amLODipine (NORVASC) 10 MG tablet TAKE 1 TABLET BY MOUTH EVERY DAY 90 tablet 1  . lamoTRIgine (LAMICTAL) 100 MG tablet Take 1 tablet (100 mg total) by mouth 2 (two) times daily. 180 tablet 3  . metFORMIN (GLUCOPHAGE) 500 MG tablet Take 1 tablet (500 mg total) by mouth 2 (two) times daily with breakfast and lunch. 180 tablet 0  . metoprolol tartrate (LOPRESSOR) 50 MG tablet Take 1 tablet (50 mg total) by mouth 2 (two) times daily. 180 tablet 3  . Misc Natural Products (DAILY HERBS IMMUNE DEFENSE PO) Take 2 capsules by mouth daily.    . Multiple Vitamins-Minerals (EMERGEN-C IMMUNE PLUS) PACK Take 1 packet by mouth daily.    . Multiple Vitamins-Minerals (MULTIVITAMIN WITH MINERALS) tablet Take 1 tablet by mouth daily.    [name] omeprazole (PRILOSEC) 20 MG capsule TAKE 1 CAPSULE BY MOUTH EVERY DAY 30 capsule 0   No current facility-administered medications on file prior to visit.    BP (!) 113/59 (BP Location: Right Arm, Patient Position: Sitting, Cuff Size: Small)   Pulse 89   Temp 98.9 F (37.2 C) (Oral)   Resp 16   Ht 4' 11.3" (1.506 m)   Wt 160 lb (72.6 kg)   SpO2 99%   BMI 31.99 kg/m       Objective:   Physical Exam Physical Exam  Constitutional: She is oriented to person, place, and time. She appears well-developed and well-nourished. No distress.  HENT:  Head: Normocephalic and atraumatic.  Right Ear: Tympanic membrane and ear canal normal.  Left Ear: Tympanic membrane and ear canal normal.  Mouth/Throat: not examined Eyes: Pupils are equal, round, and reactive to light. No scleral icterus.  Neck: Normal range of motion. No thyromegaly present.  Cardiovascular: Normal rate and regular rhythm.   No murmur heard. Pulmonary/Chest: Effort normal and breath sounds normal. No respiratory  distress. He has no wheezes. She has no rales. She exhibits no tenderness.  Abdominal: Soft. Bowel sounds are normal. She exhibits no distension and no mass. There is no tenderness. There is no rebound and no guarding.  Musculoskeletal: She exhibits no edema.  Lymphadenopathy:    She has no cervical adenopathy.  Neurological: She is alert and oriented to person, place, and time. She has normal patellar reflexes. She exhibits normal muscle tone. Coordination normal.  Skin: Skin  is warm and dry.  Psychiatric: She has a normal mood and affect. Her behavior is normal. Judgment and thought content normal.  Breasts: not examined Left: Without masses, retractions, discharge or axillary adenopathy.  Inguinal/mons: Normal without inguinal adenopathy  External genitalia: Normal  BUS/Urethra/Skene's glands: Normal  Bladder: Normal  Vagina: Normal  Cervix: Normal  Uterus: normal in size, shape and contour. Midline and mobile  Adnexa/parametria:  Rt: Without masses or tenderness.  Lt: Without masses or tenderness.  Anus and perineum: Normal            Assessment & Plan:   Preventative care- will refer for colonoscopy.  Pap performed today. Declines flu shot at this time. Completed covid vaccination.  Reviewed lab work that she brought from work.  HTN- bp stable. Continue current medication.  Seizure disorder- stable on lamictal- management per neurology.  This visit occurred during the SARS-CoV-2 public health emergency.  Safety protocols were in place, including screening questions prior to the visit, additional usage of staff PPE, and extensive cleaning of exam room while observing appropriate contact time as indicated for disinfecting solutions.          Assessment & Plan:

## 2020-02-17 NOTE — Patient Instructions (Signed)
Keep up your great work with healthy diet and regular exercise.

## 2020-02-20 LAB — CYTOLOGY - PAP
Comment: NEGATIVE
Diagnosis: NEGATIVE
High risk HPV: NEGATIVE

## 2020-02-24 ENCOUNTER — Ambulatory Visit (INDEPENDENT_AMBULATORY_CARE_PROVIDER_SITE_OTHER): Payer: 59 | Admitting: Critical Care Medicine

## 2020-02-24 ENCOUNTER — Encounter: Payer: Self-pay | Admitting: Critical Care Medicine

## 2020-02-24 ENCOUNTER — Other Ambulatory Visit: Payer: Self-pay

## 2020-02-24 VITALS — BP 120/78 | HR 87 | Temp 97.4°F | Ht 59.0 in | Wt 160.6 lb

## 2020-02-24 DIAGNOSIS — J45991 Cough variant asthma: Secondary | ICD-10-CM | POA: Diagnosis not present

## 2020-02-24 MED ORDER — ALBUTEROL SULFATE HFA 108 (90 BASE) MCG/ACT IN AERS: 2.0000 | INHALATION_SPRAY | RESPIRATORY_TRACT | 11 refills | Status: DC | PRN

## 2020-02-24 MED ORDER — BUDESONIDE-FORMOTEROL FUMARATE 160-4.5 MCG/ACT IN AERO: 2.0000 | INHALATION_SPRAY | Freq: Two times a day (BID) | RESPIRATORY_TRACT | 11 refills | Status: DC

## 2020-02-24 NOTE — Progress Notes (Signed)
Synopsis: Referred in June 2021 for SOB by Debbrah Alar, NP  Subjective:   PATIENT ID: Tiffany [name] GENDER: female DOB: 1966-02-14, MRN: [id number]  Chief Complaint  Patient presents with  . Follow-up    Shortness of breath when talking, dry cough    Tiffany [name] is a 54 year old woman with a history of obesity who presents for follow up of episodic coughing and shortness of breath.  Her symptoms have been improving since she has lost 25 pounds for medically supervised weight loss.  She has noticed an improvement in leg and back pain and generally has been feeling better since losing weight.  She notices she still has trouble completing a sentence due to shortness of breath.  She still has dry coughing, especially at night.  Over time this has been persistent, but has not worsened.  She has a son who has asthma and has been on Symbicort.  She previously had a trial of PPI without improvement in cough and has no symptoms of reflux.  No worsening cough with activity.    OV 11/15/19: Tiffany [name] is a 54 y/o woman who presents for evaluation of episodic shortness of breath.  This has been ongoing at random times for about 6 months.  It is not precipitated by anything and resolves on its own.  These episodes last about 1 second when she is talking, giving her the sensation that she cannot complete a full sentence.  This happens 3-4 times per day.  When she breaks out to feel if she can get enough air out.  She has no activity limitations and never has symptoms with activity.  She has no history of allergies or reflux.  She has an occasional dry cough about 2 nights per week when she is lying down.  This is improved since she cleaned the fan that she uses at night, so she attributes this to dust.  She coughs more when she is wearing a mask at work.  She has been changing her diet to try and lose weight through the [employer] weight loss program.  She is down 4 pounds in the  last month.  She has been sleeping better since starting this.  Her past medical history of chronic seizures has been stable; Lamictal is a chronic medication.  No new medications before this began.  She was previously evaluated by cardiology-Dr. Oval Linsey.  She had a concerning Lexi scan in November, so reassuring echocardiogram and coronary CT scan.  She had PACs and PVCs with short runs of SVT, for which she was prescribed metoprolol.  Her symptoms were not explained by her cardiac work-up.  PFTs were ordered and referral to pulmonology.     Past Medical History:  Diagnosis Date  . Dyspnea   . Epilepsy ([place])   . Hypertension   . Microscopic hematuria 09/25/2016   Had complete work up with Dr. Wendy Poet.   Cysto unrevealing,  CT scan nl   . Non Hodgkin's lymphoma ([place]) 2/08   stage III  . Obesity   . Palpitations   . Seizure disorder ([place])   . Shortness of breath 07/05/2019     Family History  Problem Relation Age of Onset  . Breast cancer Mother   . Hypertension Mother   . COPD Mother   . Diabetes Mother   . Hepatitis C Father   . Diabetes Father   . Hypertension Father   . Hyperlipidemia Father   . Heart disease Father   .  Stroke Father   . Liver disease Father   . Alcoholism Father   . Colon cancer Father        2 y/o  . Hypertension Maternal Grandmother   . Hypertension Paternal Grandmother   . Colon polyps Neg Hx   . Esophageal cancer Neg Hx   . Stomach cancer Neg Hx   . Rectal cancer Neg Hx      Past Surgical History:  Procedure Laterality Date  . [place], 2002  . PORT-A-CATH REMOVAL  2009  . PORTACATH PLACEMENT  2008    Social History   Socioeconomic History  . Marital status: Married    Spouse name: Iona Beard  . Number of children: 2  . Years of education: college  . Highest education level: Not on file  Occupational History  . Occupation: Engineer, building services: [employer]  Tobacco Use  . Smoking status: Never Smoker  .  Smokeless tobacco: Never Used  Vaping Use  . Vaping Use: Never used  Substance and Sexual Activity  . Alcohol use: No  . Drug use: No  . Sexual activity: Yes    Partners: Male  Other Topics Concern  . Not on file  Social History Narrative   2 children   Married   Works as a Curator, movies   Social Determinants of Radio broadcast assistant Strain:   . Difficulty of Paying Living Expenses: Not on file  Food Insecurity:   . Worried About Charity fundraiser in the Last Year: Not on file  . Ran Out of Food in the Last Year: Not on file  Transportation Needs:   . Lack of Transportation (Medical): Not on file  . Lack of Transportation (Non-Medical): Not on file  Physical Activity:   . Days of Exercise per Week: Not on file  . Minutes of Exercise per Session: Not on file  Stress:   . Feeling of Stress : Not on file  Social Connections:   . Frequency of Communication with Friends and Family: Not on file  . Frequency of Social Gatherings with Friends and Family: Not on file  . Attends Religious Services: Not on file  . Active Member of Clubs or Organizations: Not on file  . Attends Archivist Meetings: Not on file  . Marital Status: Not on file  Intimate Partner Violence:   . Fear of Current or Ex-Partner: Not on file  . Emotionally Abused: Not on file  . Physically Abused: Not on file  . Sexually Abused: Not on file     No Known Allergies   Immunization History  Administered Date(s) Administered  . Influenza,inj,Quad PF,6+ Mos 04/06/2013, 04/03/2014, 03/31/2018, 03/21/2019  . PFIZER SARS-COV-2 Vaccination 01/13/2020, 02/03/2020  . PPD Test 04/28/2012  . Td 06/19/2010    Outpatient Medications Prior to Visit  Medication Sig Dispense Refill  . amLODipine (NORVASC) 10 MG tablet TAKE 1 TABLET BY MOUTH EVERY DAY 90 tablet 1  . lamoTRIgine (LAMICTAL) 100 MG tablet Take 1 tablet (100 mg total) by mouth 2 (two) times daily. 180 tablet 3    . metFORMIN (GLUCOPHAGE) 500 MG tablet Take 1 tablet (500 mg total) by mouth 2 (two) times daily with breakfast and lunch. 180 tablet 0  . metoprolol tartrate (LOPRESSOR) 50 MG tablet Take 1 tablet (50 mg total) by mouth 2 (two) times daily. 180 tablet 3  . Misc Natural Products (DAILY HERBS IMMUNE DEFENSE PO) Take 2  capsules by mouth daily.    . Multiple Vitamins-Minerals (EMERGEN-C IMMUNE PLUS) PACK Take 1 packet by mouth daily.    . Multiple Vitamins-Minerals (MULTIVITAMIN WITH MINERALS) tablet Take 1 tablet by mouth daily.    [name] omeprazole (PRILOSEC) 20 MG capsule TAKE 1 CAPSULE BY MOUTH EVERY DAY 30 capsule 0   No facility-administered medications prior to visit.    Review of Systems  Constitutional: Negative for chills, fever and malaise/fatigue.  HENT: Negative for congestion.   Respiratory: Positive for cough and shortness of breath. Negative for wheezing.   Cardiovascular: Negative for chest pain, palpitations and leg swelling.  Gastrointestinal: Negative for blood in stool, heartburn, melena, nausea and vomiting.  Genitourinary: Negative for hematuria.  Musculoskeletal: Negative for joint pain and myalgias.  Skin: Negative for rash.  Neurological: Negative for dizziness and seizures.  Endo/Heme/Allergies: Negative for environmental allergies. Does not bruise/bleed easily.  Psychiatric/Behavioral: The patient does not have insomnia.      Objective:   Vitals:   02/24/20 1339  BP: 120/78  Pulse: 87  Temp: (!) 97.4 F (36.3 C)  TempSrc: Temporal  SpO2: 98%  Weight: 160 lb 9.6 oz (72.8 kg)  Height: 4\' 11"  (1.499 m)   98% on   RA BMI Readings from Last 3 Encounters:  02/24/20 32.44 kg/m  02/17/20 31.99 kg/m  02/07/20 31.31 kg/m   Wt Readings from Last 3 Encounters:  02/24/20 160 lb 9.6 oz (72.8 kg)  02/17/20 160 lb (72.6 kg)  02/07/20 155 lb (70.3 kg)    Physical Exam Vitals reviewed.  Constitutional:      General: She is not in acute distress.     Appearance: Normal appearance. She is not ill-appearing.  HENT:     Head: Normocephalic and atraumatic.  Eyes:     General: No scleral icterus. Cardiovascular:     Rate and Rhythm: Normal rate and regular rhythm.     Heart sounds: No murmur heard.   Pulmonary:     Comments: Breathing comfortably on room air, no conversational dyspnea.  No significant coughing during encounter, occasional coughing with deep inspiration. Abdominal:     General: There is no distension.     Palpations: Abdomen is soft.     Tenderness: There is no abdominal tenderness.  Musculoskeletal:        General: No swelling or deformity.     Cervical back: Neck supple.  Lymphadenopathy:     Cervical: No cervical adenopathy.  Skin:    General: Skin is warm and dry.     Findings: No rash.  Neurological:     General: No focal deficit present.     Mental Status: She is alert.  Psychiatric:        Mood and Affect: Mood normal.        Behavior: Behavior normal.      CBC    Component Value Date/Time   WBC 8.0 10/20/2019 0846   RBC 5.00 10/20/2019 0846   HGB 13.0 10/20/2019 0846   HCT 40.5 10/20/2019 0846   PLT 450 (H) 10/20/2019 0846   MCV 81.0 10/20/2019 0846   MCH 26.0 (L) 10/20/2019 0846   MCHC 32.1 10/20/2019 0846   RDW 13.1 10/20/2019 0846   LYMPHSABS 2,224 10/20/2019 0846   MONOABS 0.6 01/04/2019 1931   EOSABS 240 10/20/2019 0846   BASOSABS 32 10/20/2019 0846    CHEMISTRY No results for input(s): NA, K, CL, CO2, GLUCOSE, BUN, CREATININE, CALCIUM, MG, PHOS in the last 168 hours. CrCl cannot  be calculated (Patient's most recent lab result is older than the maximum 21 days allowed.).   Chest Imaging- films reviewed: CT coronary 07/21/2019, lung images reviewed-no nodules or opacities, left dependent atelectasis.  CXR, 1 view 01/04/2019-normal chest x-ray  Pulmonary Functions Testing Results: PFT Results Latest Ref Rng & Units 09/16/2019  FVC-Pre L 3.07  FVC-Predicted Pre % 131  FVC-Post L  3.05  FVC-Predicted Post % 130  Pre FEV1/FVC % % 78  Post FEV1/FCV % % 81  FEV1-Pre L 2.38  FEV1-Predicted Pre % 127  FEV1-Post L 2.45  DLCO uncorrected ml/min/mmHg 19.56  DLCO UNC% % 111  DLVA Predicted % 103  TLC L 4.69  TLC % Predicted % 108  RV % Predicted % 102   2021- No significant obstruction or restriction, normal diffusion.  No response to bronchodilators.  Normal PFT.   Echocardiogram 04/20/2019: LVEF 60 to 65%, mildly increased LV septal wall thickness mild LVH.  Grade 1 diastolic dysfunction.  Normal LA, RV, RA.  Normal valves.  Heart Catheterization 04/20/2019: Abnormal myocardial perfusion consistent with ischemia-reversible defect in the basal inferoseptal, basal inferior, mid inferoseptal, mid inferior and apical inferior location.  Intermediate risk study.  Normal LV function, stress EF 57%.    Assessment & Plan:   No diagnosis found.  Persistent mild shortness of breath and dry cough.  Although PFTs are normal, she has abnormal FEF 25-75 with significant bronchodilator change in this parameter.  Has not yet had a trial of bronchodilators to evaluate for possible cough variant asthma.   -Okay to discontinue PPI -Trial of Symbicort twice daily.  Instructed to rinse her mouth after every use. -Albuterol every 4 hours as needed. -Would plan to discontinue these medications at follow-up if she does not notice a difference.   RTC in 6 weeks.    Current Outpatient Medications:  .  amLODipine (NORVASC) 10 MG tablet, TAKE 1 TABLET BY MOUTH EVERY DAY, Disp: 90 tablet, Rfl: 1 .  lamoTRIgine (LAMICTAL) 100 MG tablet, Take 1 tablet (100 mg total) by mouth 2 (two) times daily., Disp: 180 tablet, Rfl: 3 .  metFORMIN (GLUCOPHAGE) 500 MG tablet, Take 1 tablet (500 mg total) by mouth 2 (two) times daily with breakfast and lunch., Disp: 180 tablet, Rfl: 0 .  metoprolol tartrate (LOPRESSOR) 50 MG tablet, Take 1 tablet (50 mg total) by mouth 2 (two) times daily., Disp: 180  tablet, Rfl: 3 .  Misc Natural Products (DAILY HERBS IMMUNE DEFENSE PO), Take 2 capsules by mouth daily., Disp: , Rfl:  .  Multiple Vitamins-Minerals (EMERGEN-C IMMUNE PLUS) PACK, Take 1 packet by mouth daily., Disp: , Rfl:  .  Multiple Vitamins-Minerals (MULTIVITAMIN WITH MINERALS) tablet, Take 1 tablet by mouth daily., Disp: , Rfl:     Julian Hy, DO [facility] 02/24/2020 1:57 PM

## 2020-02-24 NOTE — Patient Instructions (Addendum)
Thank you for visiting Dr. Carlis Abbott at [facility]. We recommend the following:   Meds ordered this encounter  Medications  . budesonide-formoterol (SYMBICORT) 160-4.5 MCG/ACT inhaler    Sig: Inhale 2 puffs into the lungs in the morning and at bedtime.    Dispense:  1 each    Refill:  11  . albuterol (VENTOLIN HFA) 108 (90 Base) MCG/ACT inhaler    Sig: Inhale 2 puffs into the lungs every 4 (four) hours as needed for wheezing or shortness of breath.    Dispense:  18 g    Refill:  11    Return in 6 weeks (on 04/06/2020), or if symptoms worsen or fail to improve. with me.   Please do your part to reduce the spread of COVID-19.

## 2020-02-27 ENCOUNTER — Ambulatory Visit (INDEPENDENT_AMBULATORY_CARE_PROVIDER_SITE_OTHER): Payer: 59 | Admitting: Adult Health

## 2020-02-27 ENCOUNTER — Encounter (INDEPENDENT_AMBULATORY_CARE_PROVIDER_SITE_OTHER): Payer: Self-pay | Admitting: Adult Health

## 2020-02-27 ENCOUNTER — Other Ambulatory Visit: Payer: Self-pay

## 2020-02-27 VITALS — BP 98/63 | HR 63 | Temp 98.2°F | Ht 59.0 in | Wt 154.0 lb

## 2020-02-27 DIAGNOSIS — R7303 Prediabetes: Secondary | ICD-10-CM | POA: Diagnosis not present

## 2020-02-27 DIAGNOSIS — E669 Obesity, unspecified: Secondary | ICD-10-CM | POA: Diagnosis not present

## 2020-02-27 DIAGNOSIS — Z6831 Body mass index (BMI) 31.0-31.9, adult: Secondary | ICD-10-CM

## 2020-02-27 DIAGNOSIS — I10 Essential (primary) hypertension: Secondary | ICD-10-CM | POA: Diagnosis not present

## 2020-02-27 DIAGNOSIS — E559 Vitamin D deficiency, unspecified: Secondary | ICD-10-CM | POA: Diagnosis not present

## 2020-02-29 NOTE — Progress Notes (Signed)
Chief Complaint:   OBESITY Tiffany [name] is here to discuss her progress with her obesity treatment plan along with follow-up of her obesity related diagnoses. Tiffany [name] is on the Category 2 Plan and states she is following her eating plan approximately 98% of the time.   Today's visit was #: 8 Starting weight: 180 lbs Starting date: 10/20/2019 Today's weight: 154 lbs Today's date: 02/27/2020 Total lbs lost to date: 26 Total lbs lost since last in-office visit: 1  Interim History: Tiffany [name] has brought in extensive labs from a recent biometric screening and these were reviewed at length.  Plt's slightly elevated-444,HS CRP-3.9 (3.1-10.0- consider re-testing in 1-2 weeks).  Lipid panel stable- Tot: 153, HDL; 52, TGs: 147, LDL: 77 Not on statin therapy, on Amlodipine/Metoprolol tartrate for HTN. She denies tobacco/vape use.  Recommend that she review labs with PCP as well.  She recently tried a Physiological scientist Protein shake" for breakfast that kept her full until dinnertime. She has found a Consulting civil engineer breakfast sandwich with 300 calories and 20 grams of protein, often her "go-to" for breakfast.   Subjective:   Essential hypertension. Blood pressure is a little soft today. She denies chest pain, dyspnea, or dizziness with position changes. We reviewed last several office visits in Epic and blood pressure has been steadily trending down. She has lost 26 lbs since starting the program. She is on a CCB and a BB.  BP Readings from Last 3 Encounters:  02/27/20 98/63  02/24/20 120/78  02/17/20 (!) 113/59   Lab Results  Component Value Date   CREATININE 0.76 10/20/2019   CREATININE 0.86 07/19/2019   CREATININE 0.84 01/04/2019   Vitamin D deficiency. 01/30/2020 labs for employee biometric screening revealed a Vitamin D level of 63. Tiffany [name] is on an OTC multivitamin.   Ref. Range 10/20/2019 08:46  Vitamin D, 25-Hydroxy Latest Ref Range: 30 - 100 ng/mL 45   Prediabetes. Tiffany [name] has a  diagnosis of prediabetes based on her elevated HgA1c and was informed this puts her at greater risk of developing diabetes. She continues to work on diet and exercise to decrease her risk of diabetes. She denies nausea or hypoglycemia. 01/30/2020 labs per biometric screening showed an A1c of 5.4, Insulin level 5.9. Tiffany [name] denies polyphagia and is not on metformin.  Lab Results  Component Value Date   HGBA1C 5.5 10/20/2019   No results found for: INSULIN  Assessment/Plan:   Essential hypertension. Tiffany [name] is working on healthy weight loss and exercise to improve blood pressure control. We will watch for signs of hypotension as she continues her lifestyle modifications. Tiffany [name] will check her blood pressure and heart rate at home and monitor for signs of hypotension. She will remain well hydrated.  If systolic blood pressure is less than 100 at her next office visit, will reduce calcium channel blocker.  Vitamin D deficiency. Low Vitamin D level contributes to fatigue and are associated with obesity, breast, and colon cancer. She agrees to continue to take her OTC multivitamin as directed and will follow-up for routine testing of Vitamin D, at least 2-3 times per year to avoid over-replacement.  Prediabetes. Tiffany [name] will continue to work on weight loss, exercise, and decreasing simple carbohydrates to help decrease the risk of diabetes. She will continue to follow the Category 2 meal plan.  Class 1 obesity with serious comorbidity and body mass index (BMI) of 31.0 to 31.9 in adult, unspecified obesity type.  Tiffany [name] is currently in the action stage of change. As such,  her goal is to continue with weight loss efforts. She has agreed to the Category 2 Plan.   We recommend reviewing biometric screening labs with her PCP as well.  Exercise goals: All adults should avoid inactivity. Some physical activity is better than none, and adults who participate in any amount of physical activity gain some  health benefits.  Behavioral modification strategies: increasing lean protein intake, meal planning and cooking strategies and planning for success.  Tiffany [name] has agreed to follow-up with our clinic in 2 weeks. She was informed of the importance of frequent follow-up visits to maximize her success with intensive lifestyle modifications for her multiple health conditions.   Objective:   Blood pressure 98/63, pulse 63, temperature 98.2 F (36.8 C), height 4\' 11"  (1.499 m), weight 154 lb (69.9 kg), SpO2 98 %. Body mass index is 31.1 kg/m.  General: Cooperative, alert, well developed, in no acute distress. HEENT: Conjunctivae and lids unremarkable. Cardiovascular: Regular rhythm.  Lungs: Normal work of breathing. Neurologic: No focal deficits.   Lab Results  Component Value Date   CREATININE 0.76 10/20/2019   BUN 17 10/20/2019   NA 137 10/20/2019   K 4.6 10/20/2019   CL 101 10/20/2019   CO2 29 10/20/2019   Lab Results  Component Value Date   ALT 26 10/20/2019   AST 18 10/20/2019   ALKPHOS 104 01/04/2019   BILITOT 0.5 10/20/2019   Lab Results  Component Value Date   HGBA1C 5.5 10/20/2019   No results found for: INSULIN Lab Results  Component Value Date   TSH 1.49 10/20/2019   Lab Results  Component Value Date   CHOL 177 10/20/2019   HDL 60 10/20/2019   LDLCALC 97 10/20/2019   TRIG 106 10/20/2019   CHOLHDL 3.0 10/20/2019   Lab Results  Component Value Date   WBC 8.0 10/20/2019   HGB 13.0 10/20/2019   HCT 40.5 10/20/2019   MCV 81.0 10/20/2019   PLT 450 (H) 10/20/2019   No results found for: IRON, TIBC, FERRITIN  Attestation Statements:   Reviewed by clinician on day of visit: allergies, medications, problem list, medical history, surgical history, family history, social history, and previous encounter notes.  Time spent on visit including pre-visit chart review and post-visit charting and care was 28 minutes.   I, Michaelene Song, am acting as Location manager  for [employer], NP-C   I have reviewed the above documentation for accuracy and completeness, and I agree with the above. -  Esaw Grandchild, NP

## 2020-03-12 ENCOUNTER — Encounter (INDEPENDENT_AMBULATORY_CARE_PROVIDER_SITE_OTHER): Payer: Self-pay | Admitting: Adult Health

## 2020-03-12 ENCOUNTER — Other Ambulatory Visit: Payer: Self-pay

## 2020-03-12 ENCOUNTER — Ambulatory Visit (INDEPENDENT_AMBULATORY_CARE_PROVIDER_SITE_OTHER): Payer: 59 | Admitting: Adult Health

## 2020-03-12 VITALS — BP 103/65 | HR 72 | Temp 98.0°F | Ht 59.0 in | Wt 154.0 lb

## 2020-03-12 DIAGNOSIS — E66811 Obesity, class 1: Secondary | ICD-10-CM

## 2020-03-12 DIAGNOSIS — Z6830 Body mass index (BMI) 30.0-30.9, adult: Secondary | ICD-10-CM

## 2020-03-12 DIAGNOSIS — R7303 Prediabetes: Secondary | ICD-10-CM | POA: Diagnosis not present

## 2020-03-12 DIAGNOSIS — E669 Obesity, unspecified: Secondary | ICD-10-CM

## 2020-03-12 DIAGNOSIS — I10 Essential (primary) hypertension: Secondary | ICD-10-CM | POA: Diagnosis not present

## 2020-03-12 NOTE — Progress Notes (Signed)
Chief Complaint:   OBESITY Tiffany [name] is here to discuss her progress with her obesity treatment plan along with follow-up of her obesity related diagnoses. Tiffany [name] is on the Category 2 Plan. Tiffany [name] states she is exercising 0 minutes 0 times per week.  Today's visit was #: 9 Starting weight: 180 lbs Starting date: 10/20/2019 Today's weight: 153 lbs Today's date: 03/12/2020 Total lbs lost to date: 27 Total lbs lost since last in-office visit: 1  Interim History: Tiffany [name] continues to enjoy the foods and structure of the Category 2 meal plan, however, is really missing her morning oatmeal. She would like to lose more weight to drop at least one more clothing size.  Subjective:   Essential hypertension. Blood pressure is stable at today's office visit. Ambulatory blood pressure - systolic blood pressure [phone]; diastolic blood pressure 62-22. Vieva is on beta-blocker and calcium channel blocker.  BP Readings from Last 3 Encounters:  03/12/20 103/65  02/27/20 98/63  02/24/20 120/78   Lab Results  Component Value Date   CREATININE 0.76 10/20/2019   CREATININE 0.86 07/19/2019   CREATININE 0.84 01/04/2019   Prediabetes. Tiffany [name] has a diagnosis of prediabetes based on her elevated HgA1c and was informed this puts her at greater risk of developing diabetes. She continues to work on diet and exercise to decrease her risk of diabetes. She denies nausea or hypoglycemia. 02/07/2020 A1c 5.9, increased from 5.5 in May 2021. Tiffany [name] is on metformin 500 mg BID.  Lab Results  Component Value Date   HGBA1C 5.5 10/20/2019   No results found for: INSULIN  Assessment/Plan:   Essential hypertension. Tiffany [name] is working on healthy weight loss and exercise to improve blood pressure control. We will watch for signs of hypotension as she continues her lifestyle modifications. She will continue healthy eating and continue her current anti-hypertensive regimen as directed. She will  continue to monitor her blood pressure at home and watch for signs/symptoms of hypotension. Labs will be checked at her next office visit.  Prediabetes. Tiffany [name] will continue to work on weight loss, exercise, and decreasing simple carbohydrates to help decrease the risk of diabetes. She will continue metformin as directed. Labs will be checked at her next office visit.  Class 1 obesity with serious comorbidity and body mass index (BMI) of 30.0 to 30.9 in adult, unspecified obesity type.  Tiffany [name] is currently in the action stage of change. As such, her goal is to continue with weight loss efforts. She has agreed to the Category 2 Plan and will journal 200-300 calories and 20+ grams of protein at breakfast.   Exercise goals: No exercise has been prescribed at this time.  Behavioral modification strategies: increasing lean protein intake, meal planning and cooking strategies, planning for success and keeping a strict food journal.  Tiffany [name] has agreed to follow-up with our clinic fasting in 2 weeks. She was informed of the importance of frequent follow-up visits to maximize her success with intensive lifestyle modifications for her multiple health conditions.   Objective:   Blood pressure 103/65, pulse 72, temperature 98 F (36.7 C), height 4\' 11"  (1.499 m), weight 154 lb (69.9 kg), SpO2 98 %. Body mass index is 31.1 kg/m.  General: Cooperative, alert, well developed, in no acute distress. HEENT: Conjunctivae and lids unremarkable. Cardiovascular: Regular rhythm.  Lungs: Normal work of breathing. Neurologic: No focal deficits.   Lab Results  Component Value Date   CREATININE 0.76 10/20/2019   BUN 17 10/20/2019   NA 137 10/20/2019  K 4.6 10/20/2019   CL 101 10/20/2019   CO2 29 10/20/2019   Lab Results  Component Value Date   ALT 26 10/20/2019   AST 18 10/20/2019   ALKPHOS 104 01/04/2019   BILITOT 0.5 10/20/2019   Lab Results  Component Value Date   HGBA1C 5.5 10/20/2019    No results found for: INSULIN Lab Results  Component Value Date   TSH 1.49 10/20/2019   Lab Results  Component Value Date   CHOL 177 10/20/2019   HDL 60 10/20/2019   LDLCALC 97 10/20/2019   TRIG 106 10/20/2019   CHOLHDL 3.0 10/20/2019   Lab Results  Component Value Date   WBC 8.0 10/20/2019   HGB 13.0 10/20/2019   HCT 40.5 10/20/2019   MCV 81.0 10/20/2019   PLT 450 (H) 10/20/2019   No results found for: IRON, TIBC, FERRITIN  Attestation Statements:   Reviewed by clinician on day of visit: allergies, medications, problem list, medical history, surgical history, family history, social history, and previous encounter notes.  Time spent on visit including pre-visit chart review and post-visit charting and care was 25 minutes.   I, Michaelene Song, am acting as Location manager for [employer], NP-C   I have reviewed the above documentation for accuracy and completeness, and I agree with the above. -  Esaw Grandchild, NP

## 2020-03-28 ENCOUNTER — Encounter (INDEPENDENT_AMBULATORY_CARE_PROVIDER_SITE_OTHER): Payer: Self-pay | Admitting: Adult Health

## 2020-03-28 ENCOUNTER — Ambulatory Visit (INDEPENDENT_AMBULATORY_CARE_PROVIDER_SITE_OTHER): Payer: 59 | Admitting: Adult Health

## 2020-03-28 ENCOUNTER — Other Ambulatory Visit: Payer: Self-pay

## 2020-03-28 VITALS — BP 110/71 | HR 71 | Temp 98.2°F | Ht 59.0 in | Wt 154.0 lb

## 2020-03-28 DIAGNOSIS — R7303 Prediabetes: Secondary | ICD-10-CM

## 2020-03-28 DIAGNOSIS — R0602 Shortness of breath: Secondary | ICD-10-CM

## 2020-03-28 DIAGNOSIS — G40909 Epilepsy, unspecified, not intractable, without status epilepticus: Secondary | ICD-10-CM

## 2020-03-28 DIAGNOSIS — E669 Obesity, unspecified: Secondary | ICD-10-CM

## 2020-03-28 DIAGNOSIS — I10 Essential (primary) hypertension: Secondary | ICD-10-CM

## 2020-03-28 DIAGNOSIS — Z6831 Body mass index (BMI) 31.0-31.9, adult: Secondary | ICD-10-CM

## 2020-03-28 NOTE — Progress Notes (Signed)
Chief Complaint:   OBESITY Tiffany [name] is here to discuss her progress with her obesity treatment plan along with follow-up of her obesity related diagnoses. Tiffany [name] is on the Category 2 Plan and journal 1200 calories and 85 grams of protein and states she is following her eating plan approximately 95% of the time. Tiffany [name] states she is exercising 0 minutes 0 times per week.  Today's visit was #: 10 Starting weight: 180 lbs Starting date: 10/20/2019 Today's weight: 154 lbs Today's date: 03/28/2020 Total lbs lost to date: 26 Total lbs lost since last in-office visit: 0  Interim History: Tiffany [name] had been using the foods on the Category 2 meal plan as a guideline and then journaling 1200 calories and 85 grams of protein. She has really enjoyed adding back in morning oatmeal. She reports feeling just fantastic!  Subjective:   Prediabetes. [name] has a diagnosis of prediabetes based on her elevated HgA1c and was informed this puts her at greater risk of developing diabetes. She continues to work on diet and exercise to decrease her risk of diabetes. She denies nausea or hypoglycemia. Last A1c 5.9 with biometric screening at work. Tiffany [name] is on metformin 500 mg BID, which she is tolerating well.  Lab Results  Component Value Date   HGBA1C 5.5 10/20/2019   No results found for: INSULIN  Essential hypertension. Blood pressure and heart rate are excellent at today's office visit. She denies cardiac symptoms. Tiffany [name] is on max dose amlodipine and denies lower extremity edema. She is on metoprolol tartrate 50 mg BID.  BP Readings from Last 3 Encounters:  03/28/20 110/71  03/12/20 103/65  02/27/20 98/63   Lab Results  Component Value Date   CREATININE 0.76 10/20/2019   CREATININE 0.86 07/19/2019   CREATININE 0.84 01/04/2019   Seizure disorder ([place]). Seizure disorder is stable with BID lamotrigine.  SOB (shortness of breath) on exertion. Pulmonology started the patient  on daily Symbicort for persistent shortness of breath with nonproductive cough. PFT normal with abnormal FEF 25-75 with sig bronchodilator change. [name] will follow-up with Pulmonology in early November 2021.  Assessment/Plan:   Prediabetes. Tiffany [name] will continue to work on weight loss, exercise, and decreasing simple carbohydrates to help decrease the risk of diabetes. Labs will be checked at the end of November.  Essential hypertension. Tiffany [name] is working on healthy weight loss and exercise to improve blood pressure control. We will watch for signs of hypotension as she continues her lifestyle modifications. She will continue calcium channel blocker and beta blocker as directed, continue healthy eating, and increase daily walking.  Seizure disorder ([place]). [name] will continue lamotrigine as directed.   SOB (shortness of breath) on exertion. Tiffany [name]'s shortness of breath appears to be obesity related and exercise induced. She has agreed to work on weight loss and gradually increase exercise to treat her exercise induced shortness of breath. Will continue to monitor closely. She will continue Symbicort as directed by Pulmonolgy. IC will be checked at her next office visit.  Class 1 obesity with serious comorbidity and body mass index (BMI) of 31.0 to 31.9 in adult, unspecified obesity type.  Tiffany [name] is currently in the action stage of change. As such, her goal is to continue with weight loss efforts. She has agreed to keeping a food journal and adhering to recommended goals of 1200 calories and 85 grams of protein.   Will check IC at the time of her next office visit.  Exercise goals: Tiffany [name] will increase daily activity 5-10  minutes a day.  Behavioral modification strategies: increasing lean protein intake, meal planning and cooking strategies, planning for success and keeping a strict food journal.  Tiffany [name] has agreed to follow-up with our clinic fasting in 2 weeks. She was informed of the  importance of frequent follow-up visits to maximize her success with intensive lifestyle modifications for her multiple health conditions.   Objective:   Blood pressure 110/71, pulse 71, temperature 98.2 F (36.8 C), height 4\' 11"  (1.499 m), weight 154 lb (69.9 kg), SpO2 98 %. Body mass index is 31.1 kg/m.  General: Cooperative, alert, well developed, in no acute distress. HEENT: Conjunctivae and lids unremarkable. Cardiovascular: Regular rhythm.  Lungs: Normal work of breathing. Neurologic: No focal deficits.   Lab Results  Component Value Date   CREATININE 0.76 10/20/2019   BUN 17 10/20/2019   NA 137 10/20/2019   K 4.6 10/20/2019   CL 101 10/20/2019   CO2 29 10/20/2019   Lab Results  Component Value Date   ALT 26 10/20/2019   AST 18 10/20/2019   ALKPHOS 104 01/04/2019   BILITOT 0.5 10/20/2019   Lab Results  Component Value Date   HGBA1C 5.5 10/20/2019   No results found for: INSULIN Lab Results  Component Value Date   TSH 1.49 10/20/2019   Lab Results  Component Value Date   CHOL 177 10/20/2019   HDL 60 10/20/2019   LDLCALC 97 10/20/2019   TRIG 106 10/20/2019   CHOLHDL 3.0 10/20/2019   Lab Results  Component Value Date   WBC 8.0 10/20/2019   HGB 13.0 10/20/2019   HCT 40.5 10/20/2019   MCV 81.0 10/20/2019   PLT 450 (H) 10/20/2019   No results found for: IRON, TIBC, FERRITIN  Attestation Statements:   Reviewed by clinician on day of visit: allergies, medications, problem list, medical history, surgical history, family history, social history, and previous encounter notes.  Time spent on visit including pre-visit chart review and post-visit charting and care was 28 minutes.   I, Michaelene Song, am acting as Location manager for [employer], NP-C   I have reviewed the above documentation for accuracy and completeness, and I agree with the above. -  Majesty Oehlert d. Sabirin Baray, NP-C

## 2020-04-10 ENCOUNTER — Encounter (INDEPENDENT_AMBULATORY_CARE_PROVIDER_SITE_OTHER): Payer: Self-pay | Admitting: Adult Health

## 2020-04-10 ENCOUNTER — Ambulatory Visit (INDEPENDENT_AMBULATORY_CARE_PROVIDER_SITE_OTHER): Payer: 59 | Admitting: Adult Health

## 2020-04-10 ENCOUNTER — Other Ambulatory Visit: Payer: Self-pay

## 2020-04-10 VITALS — BP 97/63 | HR 78 | Temp 98.6°F | Ht 59.0 in | Wt 153.0 lb

## 2020-04-10 DIAGNOSIS — Z9189 Other specified personal risk factors, not elsewhere classified: Secondary | ICD-10-CM | POA: Diagnosis not present

## 2020-04-10 DIAGNOSIS — R7303 Prediabetes: Secondary | ICD-10-CM | POA: Diagnosis not present

## 2020-04-10 DIAGNOSIS — Z6830 Body mass index (BMI) 30.0-30.9, adult: Secondary | ICD-10-CM

## 2020-04-10 DIAGNOSIS — R0602 Shortness of breath: Secondary | ICD-10-CM

## 2020-04-10 DIAGNOSIS — I10 Essential (primary) hypertension: Secondary | ICD-10-CM | POA: Diagnosis not present

## 2020-04-10 DIAGNOSIS — E669 Obesity, unspecified: Secondary | ICD-10-CM

## 2020-04-10 MED ORDER — METFORMIN HCL 500 MG PO TABS: ORAL_TABLET | ORAL | 0 refills | Status: DC

## 2020-04-10 MED ORDER — AMLODIPINE BESYLATE 2.5 MG PO TABS: 2.5000 mg | ORAL_TABLET | Freq: Every day | ORAL | 3 refills | Status: DC

## 2020-04-10 MED ORDER — AMLODIPINE BESYLATE 5 MG PO TABS: 5.0000 mg | ORAL_TABLET | Freq: Every day | ORAL | 3 refills | Status: DC

## 2020-04-10 NOTE — Progress Notes (Signed)
Chief Complaint:   OBESITY Tiffany [name] is here to discuss her progress with her obesity treatment plan along with follow-up of her obesity related diagnoses. 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 Tiffany [name] states she is exercising 0 minutes 0 times per week.  Today's visit was #: 11 Starting weight: 180 lbs Starting date: 10/20/2019 Today's weight: 153 lbs Today's date: 04/10/2020 Total lbs lost to date: 27 Total lbs lost since last in-office visit: 1  Interim History: Tiffany [name] states "I feel great." She often skips breakfast due to poor appetite in AM and, therefore, will also skip AM dose of Metformin. She would like to lose down to less than 150 lbs, which would be an additional 3-4 lbs per our weight scales.  Subjective:   SOB (shortness of breath) on exertion. [name] reports shortness of breath with extreme exertion. She denies chest pain with exertion. IC on 10/20/2019 revealed an RMR of 1436; IC today shows RMR of 1173.  Essential hypertension. Blood pressure is soft today. Ambulatory systolic blood pressure 488'Q with diastolic blood pressure 91-69'I. [name] is on amlodipine 10 mg daily and Lopressor 50 mg BID. She denies cardiac symptoms.  BP Readings from Last 3 Encounters:  04/10/20 97/63  03/28/20 110/71  03/12/20 103/65   Lab Results  Component Value Date   CREATININE 0.76 10/20/2019   CREATININE 0.86 07/19/2019   CREATININE 0.84 01/04/2019   Pre-diabetes. Tiffany [name] has a diagnosis of prediabetes based on her elevated HgA1c and was informed this puts her at greater risk of developing diabetes. She continues to work on diet and exercise to decrease her risk of diabetes. She denies nausea or hypoglycemia. Tiffany [name] reports poor appetite in the AM and will often skip breakfast and AM Metformin dose.  Lab Results    Component Value Date   HGBA1C 5.5 10/20/2019   No results found for: INSULIN  At risk for deficient intake of food. The patient is at a higher than average risk of deficient intake of food due to skipping meals - decreased metformin from BID to daily.  Assessment/Plan:   SOB (shortness of breath) on exertion. Tiffany [name]'s shortness of breath appears to be obesity related and exercise induced. She has agreed to work on weight loss and gradually increase exercise to treat her exercise induced shortness of breath. Will continue to monitor closely. IC checked today showing an RMR of 1173.  Essential hypertension. Tiffany [name] is working on healthy weight loss and exercise to improve blood pressure control. We will watch for signs of hypotension as she continues her lifestyle modifications. [name] will change amlodipine to 7.5 mg (2.5 mg + 5 mg) amLODipine (NORVASC) 2.5 MG tablet, amLODipine (NORVASC) 5 MG tablet daily #90.  Pre-diabetes. Tiffany [name] will continue to work on weight loss, exercise, and decreasing simple carbohydrates to help decrease the risk of diabetes. She will continue to focus on protein at each meal and will decrease metformin dose to metFORMIN (GLUCOPHAGE) 500 MG tablet with lunch.  At risk for deficient intake of food. Tiffany [name] was given approximately 15 minutes of deficit intake of food prevention counseling today. Tiffany [name] is at risk for eating too few calories based on current food recall. She was encouraged to focus on meeting caloric and protein goals according to her recommended meal plan.   Class 1 obesity with serious comorbidity and body mass index (  BMI) of 30.0 to 30.9 in adult, unspecified obesity type.  Tiffany [name] is currently in the action stage of change. As such, her goal is to continue with weight loss efforts. She has agreed to keeping a food journal and adhering to recommended goals of 1100 calories and 85 grams of protein daily. She will decrease calorie intake by 100  calories a day; will continue the same daily protein intake.  Exercise goals: Tiffany [name] will do 5 minutes of movement per day.  Behavioral modification strategies: increasing lean protein intake, meal planning and cooking strategies, planning for success and keeping a strict food journal.  Tiffany [name] has agreed to follow-up with our clinic in 2 weeks. She was informed of the importance of frequent follow-up visits to maximize her success with intensive lifestyle modifications for her multiple health conditions.   Objective:   Blood pressure 97/63, pulse 78, temperature 98.6 F (37 C), height 4\' 11"  (1.499 m), weight 153 lb (69.4 kg), SpO2 100 %. Body mass index is 30.9 kg/m.  General: Cooperative, alert, well developed, in no acute distress. HEENT: Conjunctivae and lids unremarkable. Cardiovascular: Regular rhythm.  Lungs: Normal work of breathing. Neurologic: No focal deficits.   Lab Results  Component Value Date   CREATININE 0.76 10/20/2019   BUN 17 10/20/2019   NA 137 10/20/2019   K 4.6 10/20/2019   CL 101 10/20/2019   CO2 29 10/20/2019   Lab Results  Component Value Date   ALT 26 10/20/2019   AST 18 10/20/2019   ALKPHOS 104 01/04/2019   BILITOT 0.5 10/20/2019   Lab Results  Component Value Date   HGBA1C 5.5 10/20/2019   No results found for: INSULIN Lab Results  Component Value Date   TSH 1.49 10/20/2019   Lab Results  Component Value Date   CHOL 177 10/20/2019   HDL 60 10/20/2019   LDLCALC 97 10/20/2019   TRIG 106 10/20/2019   CHOLHDL 3.0 10/20/2019   Lab Results  Component Value Date   WBC 8.0 10/20/2019   HGB 13.0 10/20/2019   HCT 40.5 10/20/2019   MCV 81.0 10/20/2019   PLT 450 (H) 10/20/2019   No results found for: IRON, TIBC, FERRITIN  Attestation Statements:   Reviewed by clinician on day of visit: allergies, medications, problem list, medical history, surgical history, family history, social history, and previous encounter notes.  I, Michaelene Song, am acting as Location manager for [employer], NP-C   I have reviewed the above documentation for accuracy and completeness, and I agree with the above. -  Demya Scruggs d. Theoden Mauch, NP-C

## 2020-04-13 ENCOUNTER — Ambulatory Visit (INDEPENDENT_AMBULATORY_CARE_PROVIDER_SITE_OTHER): Payer: 59 | Admitting: Critical Care Medicine

## 2020-04-13 ENCOUNTER — Encounter: Payer: Self-pay | Admitting: Critical Care Medicine

## 2020-04-13 ENCOUNTER — Other Ambulatory Visit: Payer: Self-pay

## 2020-04-13 VITALS — BP 120/80 | HR 71 | Temp 98.5°F | Ht 59.0 in | Wt 157.6 lb

## 2020-04-13 DIAGNOSIS — J45991 Cough variant asthma: Secondary | ICD-10-CM

## 2020-04-13 NOTE — Progress Notes (Signed)
Synopsis: Referred in June 2021 for SOB by Debbrah Alar, NP  Subjective:   PATIENT ID: Tiffany [name] GENDER: female DOB: 06-03-1966, MRN: [id number]  Chief Complaint  Patient presents with  . Follow-up    none     Tiffany [name] is a 54 y/o woman with a history of obesity who presents for follow up of coughing and SOB. She has continued to follow up with her PCP for her obesity and has maintained her weight loss.  They are now decreasing her antihypertensives and Metformin.  She was last seen about 6 weeks ago and was started on a trial of Symbicort for possible cough variant asthma based on normal PFT results.  Since her last visit when she was started on Symbicort she has had almost no coughing or shortness of breath.  She has been taking Symbicort only once per day in the morning, but continues to have well-controlled symptoms despite this.  She is no longer taking PPI-discontinued at the last visit due to lack of benefit.    OV 02/24/20: Tiffany [name] is a 54 year old woman with a history of obesity who presents for follow up of episodic coughing and shortness of breath.  Her symptoms have been improving since she has lost 25 pounds for medically supervised weight loss.  She has noticed an improvement in leg and back pain and generally has been feeling better since losing weight.  She notices she still has trouble completing a sentence due to shortness of breath.  She still has dry coughing, especially at night.  Over time this has been persistent, but has not worsened.  She has a son who has asthma and has been on Symbicort.  She previously had a trial of PPI without improvement in cough and has no symptoms of reflux.  No worsening cough with activity.   OV 11/15/19: Tiffany [name] is a 54 y/o woman who presents for evaluation of episodic shortness of breath.  This has been ongoing at random times for about 6 months.  It is not precipitated by anything and resolves  on its own.  These episodes last about 1 second when she is talking, giving her the sensation that she cannot complete a full sentence.  This happens 3-4 times per day.  When she breaks out to feel if she can get enough air out.  She has no activity limitations and never has symptoms with activity.  She has no history of allergies or reflux.  She has an occasional dry cough about 2 nights per week when she is lying down.  This is improved since she cleaned the fan that she uses at night, so she attributes this to dust.  She coughs more when she is wearing a mask at work.  She has been changing her diet to try and lose weight through the [facility] weight loss program.  She is down 4 pounds in the last month.  She has been sleeping better since starting this.  Her past medical history of chronic seizures has been stable; Lamictal is a chronic medication.  No new medications before this began.  She was previously evaluated by cardiology-Dr. Oval Linsey.  She had a concerning Lexi scan in November, so reassuring echocardiogram and coronary CT scan.  She had PACs and PVCs with short runs of SVT, for which she was prescribed metoprolol.  Her symptoms were not explained by her cardiac work-up.  PFTs were ordered and referral to pulmonology.     Past Medical History:  Diagnosis Date  . Dyspnea   . Epilepsy ([place])   . Hypertension   . Microscopic hematuria 09/25/2016   Had complete work up with Dr. Wendy Poet.   Cysto unrevealing,  CT scan nl   . Non Hodgkin's lymphoma ([place]) 2/08   stage III  . Obesity   . Palpitations   . Seizure disorder ([place])   . Shortness of breath 07/05/2019     Family History  Problem Relation Age of Onset  . Breast cancer Mother   . Hypertension Mother   . COPD Mother   . Diabetes Mother   . Hepatitis C Father   . Diabetes Father   . Hypertension Father   . Hyperlipidemia Father   . Heart disease Father   . Stroke Father   . Liver disease Father   . Alcoholism Father   .  Colon cancer Father        9 y/o  . Hypertension Maternal Grandmother   . Hypertension Paternal Grandmother   . Colon polyps Neg Hx   . Esophageal cancer Neg Hx   . Stomach cancer Neg Hx   . Rectal cancer Neg Hx      Past Surgical History:  Procedure Laterality Date  . [place], 2002  . PORT-A-CATH REMOVAL  2009  . PORTACATH PLACEMENT  2008    Social History   Socioeconomic History  . Marital status: Married    Spouse name: Iona Beard  . Number of children: 2  . Years of education: college  . Highest education level: Not on file  Occupational History  . Occupation: Engineer, building services: [employer]  Tobacco Use  . Smoking status: Never Smoker  . Smokeless tobacco: Never Used  Vaping Use  . Vaping Use: Never used  Substance and Sexual Activity  . Alcohol use: No  . Drug use: No  . Sexual activity: Yes    Partners: Male  Other Topics Concern  . Not on file  Social History Narrative   2 children   Married   Works as a Curator, movies   Social Determinants of Radio broadcast assistant Strain:   . Difficulty of Paying Living Expenses: Not on file  Food Insecurity:   . Worried About Charity fundraiser in the Last Year: Not on file  . Ran Out of Food in the Last Year: Not on file  Transportation Needs:   . Lack of Transportation (Medical): Not on file  . Lack of Transportation (Non-Medical): Not on file  Physical Activity:   . Days of Exercise per Week: Not on file  . Minutes of Exercise per Session: Not on file  Stress:   . Feeling of Stress : Not on file  Social Connections:   . Frequency of Communication with Friends and Family: Not on file  . Frequency of Social Gatherings with Friends and Family: Not on file  . Attends Religious Services: Not on file  . Active Member of Clubs or Organizations: Not on file  . Attends Archivist Meetings: Not on file  . Marital Status: Not on file  Intimate Partner  Violence:   . Fear of Current or Ex-Partner: Not on file  . Emotionally Abused: Not on file  . Physically Abused: Not on file  . Sexually Abused: Not on file     No Known Allergies   Immunization History  Administered Date(s) Administered  . Influenza,inj,Quad PF,6+ Mos 04/06/2013, 04/03/2014, 03/31/2018,  03/21/2019, 03/26/2020  . PFIZER SARS-COV-2 Vaccination 01/13/2020, 02/03/2020  . PPD Test 04/28/2012  . Td 06/19/2010    Outpatient Medications Prior to Visit  Medication Sig Dispense Refill  . albuterol (VENTOLIN HFA) 108 (90 Base) MCG/ACT inhaler Inhale 2 puffs into the lungs every 4 (four) hours as needed for wheezing or shortness of breath. 18 g 11  . amLODipine (NORVASC) 2.5 MG tablet Take 1 tablet (2.5 mg total) by mouth daily. 90 tablet 3  . amLODipine (NORVASC) 5 MG tablet Take 1 tablet (5 mg total) by mouth daily. 90 tablet 3  . budesonide-formoterol (SYMBICORT) 160-4.5 MCG/ACT inhaler Inhale 2 puffs into the lungs in the morning and at bedtime. 1 each 11  . lamoTRIgine (LAMICTAL) 100 MG tablet Take 1 tablet (100 mg total) by mouth 2 (two) times daily. 180 tablet 3  . metFORMIN (GLUCOPHAGE) 500 MG tablet Daily with lunch 180 tablet 0  . metoprolol tartrate (LOPRESSOR) 50 MG tablet Take 1 tablet (50 mg total) by mouth 2 (two) times daily. 180 tablet 3  . Misc Natural Products (DAILY HERBS IMMUNE DEFENSE PO) Take 2 capsules by mouth daily.    . Multiple Vitamins-Minerals (EMERGEN-C IMMUNE PLUS) PACK Take 1 packet by mouth daily.    . Multiple Vitamins-Minerals (MULTIVITAMIN WITH MINERALS) tablet Take 1 tablet by mouth daily.     No facility-administered medications prior to visit.    Review of Systems  Constitutional: Negative for chills, fever and malaise/fatigue.  HENT: Negative for congestion.   Respiratory: Positive for cough and shortness of breath. Negative for wheezing.   Cardiovascular: Negative for chest pain, palpitations and leg swelling.  Gastrointestinal:  Negative for blood in stool, heartburn, melena, nausea and vomiting.  Genitourinary: Negative for hematuria.  Musculoskeletal: Negative for joint pain and myalgias.  Skin: Negative for rash.  Neurological: Negative for dizziness and seizures.  Endo/Heme/Allergies: Negative for environmental allergies. Does not bruise/bleed easily.  Psychiatric/Behavioral: The patient does not have insomnia.      Objective:   Vitals:   04/13/20 1319  BP: 120/80  Pulse: 71  Temp: 98.5 F (36.9 C)  SpO2: 100%  Weight: 157 lb 9.6 oz (71.5 kg)  Height: 4\' 11"  (1.499 m)   100% on   RA BMI Readings from Last 3 Encounters:  04/13/20 31.83 kg/m  04/10/20 30.90 kg/m  03/28/20 31.10 kg/m   Wt Readings from Last 3 Encounters:  04/13/20 157 lb 9.6 oz (71.5 kg)  04/10/20 153 lb (69.4 kg)  03/28/20 154 lb (69.9 kg)    Physical Exam Vitals reviewed.  Constitutional:      General: She is not in acute distress.    Appearance: She is not ill-appearing.  HENT:     Head: Normocephalic and atraumatic.  Eyes:     General: No scleral icterus. Cardiovascular:     Rate and Rhythm: Normal rate and regular rhythm.     Heart sounds: No murmur heard.   Pulmonary:     Comments: Breathing comfortably on room air, no conversational dyspnea.  Clear to auscultation bilaterally. Abdominal:     General: There is no distension.     Palpations: Abdomen is soft.     Tenderness: There is no abdominal tenderness.  Musculoskeletal:        General: No swelling or deformity.     Cervical back: Neck supple.  Lymphadenopathy:     Cervical: No cervical adenopathy.  Skin:    General: Skin is warm and dry.     Findings:  No rash.  Neurological:     General: No focal deficit present.     Mental Status: She is alert.     Coordination: Coordination normal.  Psychiatric:        Mood and Affect: Mood normal.        Behavior: Behavior normal.      CBC    Component Value Date/Time   WBC 8.0 10/20/2019 0846   RBC  5.00 10/20/2019 0846   HGB 13.0 10/20/2019 0846   HCT 40.5 10/20/2019 0846   PLT 450 (H) 10/20/2019 0846   MCV 81.0 10/20/2019 0846   MCH 26.0 (L) 10/20/2019 0846   MCHC 32.1 10/20/2019 0846   RDW 13.1 10/20/2019 0846   LYMPHSABS 2,224 10/20/2019 0846   MONOABS 0.6 01/04/2019 1931   EOSABS 240 10/20/2019 0846   BASOSABS 32 10/20/2019 0846    CHEMISTRY No results for input(s): NA, K, CL, CO2, GLUCOSE, BUN, CREATININE, CALCIUM, MG, PHOS in the last 168 hours. CrCl cannot be calculated (Patient's most recent lab result is older than the maximum 21 days allowed.).   Chest Imaging- films reviewed: CT coronary 07/21/2019, lung images reviewed-no nodules or opacities, left dependent atelectasis.  CXR, 1 view 01/04/2019-normal chest x-ray  Pulmonary Functions Testing Results: PFT Results Latest Ref Rng & Units 09/16/2019  FVC-Pre L 3.07  FVC-Predicted Pre % 131  FVC-Post L 3.05  FVC-Predicted Post % 130  Pre FEV1/FVC % % 78  Post FEV1/FCV % % 81  FEV1-Pre L 2.38  FEV1-Predicted Pre % 127  FEV1-Post L 2.45  DLCO uncorrected ml/min/mmHg 19.56  DLCO UNC% % 111  DLVA Predicted % 103  TLC L 4.69  TLC % Predicted % 108  RV % Predicted % 102   2021- No significant obstruction or restriction, normal diffusion.  No response to bronchodilators.  Normal PFT.   Echocardiogram 04/20/2019: LVEF 60 to 65%, mildly increased LV septal wall thickness mild LVH.  Grade 1 diastolic dysfunction.  Normal LA, RV, RA.  Normal valves.  Heart Catheterization 04/20/2019: Abnormal myocardial perfusion consistent with ischemia-reversible defect in the basal inferoseptal, basal inferior, mid inferoseptal, mid inferior and apical inferior location.  Intermediate risk study.  Normal LV function, stress EF 57%.    Assessment & Plan:   No diagnosis found.  Persistent mild shortness of breath and dry cough.  Although PFTs are normal, she has abnormal FEF 25-75 with significant bronchodilator change in this  parameter.  Has not yet had a trial of bronchodilators to evaluate for possible cough variant asthma.   -Con't symbicort 2 puffs- ok to use once daily if that is controlling her symptoms and her PFTs are normal. Rinse after every use.  -Albuterol every 4 hours as needed. -Planning to deescalate to low dose Symbicort  -Up-to-date on Covid and flu vaccines   RTC in 2 months with Dr. Erin Fulling.    Current Outpatient Medications:  .  albuterol (VENTOLIN HFA) 108 (90 Base) MCG/ACT inhaler, Inhale 2 puffs into the lungs every 4 (four) hours as needed for wheezing or shortness of breath., Disp: 18 g, Rfl: 11 .  amLODipine (NORVASC) 2.5 MG tablet, Take 1 tablet (2.5 mg total) by mouth daily., Disp: 90 tablet, Rfl: 3 .  amLODipine (NORVASC) 5 MG tablet, Take 1 tablet (5 mg total) by mouth daily., Disp: 90 tablet, Rfl: 3 .  budesonide-formoterol (SYMBICORT) 160-4.5 MCG/ACT inhaler, Inhale 2 puffs into the lungs in the morning and at bedtime., Disp: 1 each, Rfl: 11 .  lamoTRIgine (  LAMICTAL) 100 MG tablet, Take 1 tablet (100 mg total) by mouth 2 (two) times daily., Disp: 180 tablet, Rfl: 3 .  metFORMIN (GLUCOPHAGE) 500 MG tablet, Daily with lunch, Disp: 180 tablet, Rfl: 0 .  metoprolol tartrate (LOPRESSOR) 50 MG tablet, Take 1 tablet (50 mg total) by mouth 2 (two) times daily., Disp: 180 tablet, Rfl: 3 .  Misc Natural Products (DAILY HERBS IMMUNE DEFENSE PO), Take 2 capsules by mouth daily., Disp: , Rfl:  .  Multiple Vitamins-Minerals (EMERGEN-C IMMUNE PLUS) PACK, Take 1 packet by mouth daily., Disp: , Rfl:  .  Multiple Vitamins-Minerals (MULTIVITAMIN WITH MINERALS) tablet, Take 1 tablet by mouth daily., Disp: , Rfl:     Julian Hy, DO [facility] 04/13/2020 1:29 PM

## 2020-04-13 NOTE — Patient Instructions (Addendum)
Thank you for visiting Dr. Carlis Abbott at [facility]. We recommend the following:   Return in about 2 months (around 06/13/2020). with Dr. Erin Fulling  (30 minutes visit).    Please do your part to reduce the spread of COVID-19.

## 2020-04-16 ENCOUNTER — Other Ambulatory Visit: Payer: Self-pay | Admitting: Critical Care Medicine

## 2020-04-23 ENCOUNTER — Encounter: Payer: Self-pay | Admitting: Family

## 2020-04-25 ENCOUNTER — Encounter (INDEPENDENT_AMBULATORY_CARE_PROVIDER_SITE_OTHER): Payer: Self-pay | Admitting: Adult Health

## 2020-04-25 ENCOUNTER — Other Ambulatory Visit: Payer: Self-pay

## 2020-04-25 ENCOUNTER — Ambulatory Visit (INDEPENDENT_AMBULATORY_CARE_PROVIDER_SITE_OTHER): Payer: 59 | Admitting: Adult Health

## 2020-04-25 VITALS — BP 129/76 | HR 80 | Temp 97.8°F | Ht 59.0 in | Wt 151.0 lb

## 2020-04-25 DIAGNOSIS — H539 Unspecified visual disturbance: Secondary | ICD-10-CM

## 2020-04-25 DIAGNOSIS — E669 Obesity, unspecified: Secondary | ICD-10-CM

## 2020-04-25 DIAGNOSIS — R7303 Prediabetes: Secondary | ICD-10-CM | POA: Diagnosis not present

## 2020-04-25 DIAGNOSIS — I10 Essential (primary) hypertension: Secondary | ICD-10-CM

## 2020-04-25 DIAGNOSIS — Z6830 Body mass index (BMI) 30.0-30.9, adult: Secondary | ICD-10-CM

## 2020-04-26 DIAGNOSIS — H539 Unspecified visual disturbance: Secondary | ICD-10-CM | POA: Insufficient documentation

## 2020-04-26 NOTE — Progress Notes (Signed)
Chief Complaint:   OBESITY Tiffany [name] is here to discuss her progress with her obesity treatment plan along with follow-up of her obesity related diagnoses. Tiffany [name] is on keeping a food journal and adhering to recommended goals of 1100 calories and 85 grams of protein and states she is following her eating plan approximately 95% of the time. Tiffany [name] states she is not exercising regularly at this time.  Today's visit was #: 12 Starting weight: 180 lbs Starting date: 10/20/2019 Today's weight: 151 lbs Today's date: 04/25/2020 Total lbs lost to date: 29 lbs Total lbs lost since last in-office visit: 2 lbs  Interim History: Tiffany [name] says that since 04/22/2020, she has been having a slight visual disturbance.  "Everything just drifts", she says.  She reports vision drifting from right to left visual field.  She denies history of recent head trauma.  Blood pressure in office and at home both stable. Unable to check blood glucose because she has no meter at home.  She has not had a dilated eye exam in over 2 years.  Subjective:   1. Essential hypertension Review: taking medications as instructed, no medication side effects noted, no chest pain on exertion, no dyspnea on exertion, no swelling of ankles.  Blood pressure and heart rate excellent in office today.  She is on amlodipine 2.5 mg daily.  Recently reduced from 5 mg.  She is also on metoprolol tartrate 50 mg twice daily.  Ambulatory SBP 110-120, DBP 70-80, HR 80s.  BP Readings from Last 3 Encounters:  04/25/20 129/76  04/13/20 120/80  04/10/20 97/63   2. Pre-diabetes Tiffany [name] has a diagnosis of prediabetes based on her elevated HgA1c and was informed this puts her at greater risk of developing diabetes. She continues to work on diet and exercise to decrease her risk of diabetes. She denies nausea or hypoglycemia.  Metformin was decreased to 500 mg with lunch only due to poor appetite at last office visit.  Now she reports being able to eat  all meals daily.  Lab Results  Component Value Date   HGBA1C 5.5 10/20/2019   3. Visual disturbance Visual disturbance of "drifting" from right to left visual field the last 3 days.  Will occur after she has her eyes closed for a few seconds and then focuses on an object ahead of her.  She denies recent head injury.  She denies history of vertigo or migraine headache.  Ambulatory SBP 110-120, DBP 70-80, HR 80.  Assessment/Plan:   1. Essential hypertension Melina is working on healthy weight loss and exercise to improve blood pressure control. We will watch for signs of hypotension as she continues her lifestyle modifications.  Continue current CCB/BB dosage.  2. Pre-diabetes Tiffany [name] will continue to work on weight loss, exercise, and decreasing simple carbohydrates to help decrease the risk of diabetes.  She is to call her insurance and inquire which glucometer is covered.  She will call us with the information and then we will order.  3. Visual disturbance 1.  Schedule diabetic eye exam.  2.  Monitor blood pressure/blood glucose at home and bring log to follow-ups.  3.  Remain well hydrated.  4.  Possible referral to Neurology.  4. Class 1 obesity with serious comorbidity and body mass index (BMI) of 30.0 to 30.9 in adult, unspecified obesity type  Tiffany [name] is currently in the action stage of change. As such, her goal is to continue with weight loss efforts. She has agreed to keeping a food journal  and adhering to recommended goals of 1100 calories and 85 grams of protein.   Exercise goals: No exercise has been prescribed at this time.  Behavioral modification strategies: increasing lean protein intake, meal planning and cooking strategies and planning for success.  Handout provided:  Thanksgiving sheet.  Tiffany [name] has agreed to follow-up with our clinic in 2 weeks. She was informed of the importance of frequent follow-up visits to maximize her success with intensive lifestyle  modifications for her multiple health conditions.   Objective:   Blood pressure 129/76, pulse 80, temperature 97.8 F (36.6 C), height 4\' 11"  (1.499 m), weight 151 lb (68.5 kg), SpO2 99 %. Body mass index is 30.5 kg/m.  General: Cooperative, alert, well developed, in no acute distress. HEENT: Conjunctivae and lids unremarkable. Cardiovascular: Regular rhythm.  Lungs: Normal work of breathing. Neurologic: No focal deficits.   Lab Results  Component Value Date   CREATININE 0.76 10/20/2019   BUN 17 10/20/2019   NA 137 10/20/2019   K 4.6 10/20/2019   CL 101 10/20/2019   CO2 29 10/20/2019   Lab Results  Component Value Date   ALT 26 10/20/2019   AST 18 10/20/2019   ALKPHOS 104 01/04/2019   BILITOT 0.5 10/20/2019   Lab Results  Component Value Date   HGBA1C 5.5 10/20/2019   Lab Results  Component Value Date   TSH 1.49 10/20/2019   Lab Results  Component Value Date   CHOL 177 10/20/2019   HDL 60 10/20/2019   LDLCALC 97 10/20/2019   TRIG 106 10/20/2019   CHOLHDL 3.0 10/20/2019   Lab Results  Component Value Date   WBC 8.0 10/20/2019   HGB 13.0 10/20/2019   HCT 40.5 10/20/2019   MCV 81.0 10/20/2019   PLT 450 (H) 10/20/2019   Attestation Statements:   Reviewed by clinician on day of visit: allergies, medications, problem list, medical history, surgical history, family history, social history, and previous encounter notes.  Time spent on visit including pre-visit chart review and post-visit care and charting was 35 minutes.   I, Water quality scientist, CMA, am acting as Location manager for Mina Marble, NP.  I have reviewed the above documentation for accuracy and completeness, and I agree with the above. -  Ramell Wacha d. Dalanie Kisner, NP-C

## 2020-05-10 ENCOUNTER — Other Ambulatory Visit (INDEPENDENT_AMBULATORY_CARE_PROVIDER_SITE_OTHER): Payer: Self-pay | Admitting: Bariatrics

## 2020-05-10 DIAGNOSIS — R7303 Prediabetes: Secondary | ICD-10-CM

## 2020-05-14 ENCOUNTER — Ambulatory Visit (INDEPENDENT_AMBULATORY_CARE_PROVIDER_SITE_OTHER): Payer: 59 | Admitting: Family Medicine

## 2020-05-21 ENCOUNTER — Other Ambulatory Visit: Payer: Self-pay | Admitting: Critical Care Medicine

## 2020-05-21 MED ORDER — BUDESONIDE-FORMOTEROL FUMARATE 160-4.5 MCG/ACT IN AERO: 2.0000 | INHALATION_SPRAY | Freq: Two times a day (BID) | RESPIRATORY_TRACT | 3 refills | Status: AC

## 2020-05-30 ENCOUNTER — Ambulatory Visit (INDEPENDENT_AMBULATORY_CARE_PROVIDER_SITE_OTHER): Payer: 59 | Admitting: Family Medicine

## 2020-05-30 ENCOUNTER — Other Ambulatory Visit: Payer: Self-pay

## 2020-05-30 ENCOUNTER — Encounter (INDEPENDENT_AMBULATORY_CARE_PROVIDER_SITE_OTHER): Payer: Self-pay | Admitting: Family Medicine

## 2020-05-30 VITALS — BP 107/70 | HR 77 | Temp 98.6°F | Ht 59.0 in | Wt 152.0 lb

## 2020-05-30 DIAGNOSIS — E8881 Metabolic syndrome: Secondary | ICD-10-CM

## 2020-05-30 DIAGNOSIS — H539 Unspecified visual disturbance: Secondary | ICD-10-CM | POA: Diagnosis not present

## 2020-05-30 DIAGNOSIS — Z6830 Body mass index (BMI) 30.0-30.9, adult: Secondary | ICD-10-CM

## 2020-05-30 DIAGNOSIS — R7303 Prediabetes: Secondary | ICD-10-CM | POA: Diagnosis not present

## 2020-05-30 DIAGNOSIS — E669 Obesity, unspecified: Secondary | ICD-10-CM | POA: Diagnosis not present

## 2020-05-30 DIAGNOSIS — E88819 Insulin resistance, unspecified: Secondary | ICD-10-CM | POA: Insufficient documentation

## 2020-05-30 NOTE — Progress Notes (Signed)
Chief Complaint:   OBESITY Tiffany [name] is here to discuss her progress with her obesity treatment plan along with follow-up of her obesity related diagnoses. Tiffany [name] is on keeping a food journal and adhering to recommended goals of 1100 calories and 85 grams of protein and states she is following her eating plan approximately 95% of the time. Tiffany [name] states she is not exercising at this time.  Today's visit was #: 13 Starting weight: 180 lbs Starting date: 10/20/2019 Today's weight: 152 lbs Today's date: 05/30/2020 Total lbs lost to date: 28 lbs Total lbs lost since last in-office visit: 0 (+1)  Interim History: Tiffany [name] has adhered well to the plan (95% of the time). She is up 1 lb today.  She does not feel the holidays have deterred her from sticking to the plan.  She journals daily and meets calorie and protein goals.  She notes hunger is satisfied overall.  Subjective:   1. Insulin resistance Tiffany [name] is on metformin.  Denies polyphagia.  She takes metformin 1-2 times per day though it is on her list for once daily.  Last A1c was 5.5.  2. Visual disturbance At her last OV, she described a visual disturbance of "drifting" from right to left visual field. She notes visual disturbance subsided after a few days.  Denies dizziness, headache.  Blood pressure well-controlled.  She did not check CBGs at home.  Blood pressure were 100-120/60-70s.    Assessment/Plan:   1. Insulin resistance Continue metformin at current dose. May take with 1 or 2 meals per day.  2. Visual disturbance She will call her Ophthalmologist right away if she notes this issue again.  3. Class 1 obesity with serious comorbidity and body mass index (BMI) of 30.0 to 30.9 in adult, unspecified obesity type  Tiffany [name] is currently in the action stage of change. As such, her goal is to continue with weight loss efforts. She has agreed to keeping a food journal and adhering to recommended goals of 1100 calories and 85  grams of protein.   Exercise goals: All adults should avoid inactivity. Some physical activity is better than none, and adults who participate in any amount of physical activity gain some health benefits.  Behavioral modification strategies: better snacking choices and holiday eating strategies .  Tiffany [name] has agreed to follow-up with our clinic in 2-3 weeks.   Objective:   Blood pressure 107/70, pulse 77, temperature 98.6 F (37 C), height 4\' 11"  (1.499 m), weight 152 lb (68.9 kg), SpO2 96 %. Body mass index is 30.7 kg/m.  General: Cooperative, alert, well developed, in no acute distress. HEENT: Conjunctivae and lids unremarkable. Cardiovascular: Regular rhythm.  Lungs: Normal work of breathing. Neurologic: No focal deficits.   Lab Results  Component Value Date   CREATININE 0.76 10/20/2019   BUN 17 10/20/2019   NA 137 10/20/2019   K 4.6 10/20/2019   CL 101 10/20/2019   CO2 29 10/20/2019   Lab Results  Component Value Date   ALT 26 10/20/2019   AST 18 10/20/2019   ALKPHOS 104 01/04/2019   BILITOT 0.5 10/20/2019   Lab Results  Component Value Date   HGBA1C 5.5 10/20/2019   Lab Results  Component Value Date   TSH 1.49 10/20/2019   Lab Results  Component Value Date   CHOL 177 10/20/2019   HDL 60 10/20/2019   LDLCALC 97 10/20/2019   TRIG 106 10/20/2019   CHOLHDL 3.0 10/20/2019   Lab Results  Component Value Date  WBC 8.0 10/20/2019   HGB 13.0 10/20/2019   HCT 40.5 10/20/2019   MCV 81.0 10/20/2019   PLT 450 (H) 10/20/2019   Attestation Statements:   Reviewed by clinician on day of visit: allergies, medications, problem list, medical history, surgical history, family history, social history, and previous encounter notes.  I, Water quality scientist, CMA, am acting as Location manager for [employer], [place].  I have reviewed the above documentation for accuracy and completeness, and I agree with the above. -  Georgianne Fick, FNP

## 2020-06-14 ENCOUNTER — Encounter: Payer: Self-pay | Admitting: Gastroenterology

## 2020-06-17 ENCOUNTER — Encounter: Payer: Self-pay | Admitting: Family

## 2020-06-20 ENCOUNTER — Other Ambulatory Visit: Payer: Self-pay

## 2020-06-20 ENCOUNTER — Other Ambulatory Visit: Payer: Self-pay | Admitting: Cardiology

## 2020-06-20 ENCOUNTER — Ambulatory Visit (INDEPENDENT_AMBULATORY_CARE_PROVIDER_SITE_OTHER): Payer: 59 | Admitting: Bariatrics

## 2020-06-20 ENCOUNTER — Encounter (INDEPENDENT_AMBULATORY_CARE_PROVIDER_SITE_OTHER): Payer: Self-pay | Admitting: Bariatrics

## 2020-06-20 VITALS — BP 114/71 | HR 73 | Temp 98.3°F | Ht 59.0 in | Wt 151.0 lb

## 2020-06-20 DIAGNOSIS — R079 Chest pain, unspecified: Secondary | ICD-10-CM

## 2020-06-20 DIAGNOSIS — E559 Vitamin D deficiency, unspecified: Secondary | ICD-10-CM

## 2020-06-20 DIAGNOSIS — I10 Essential (primary) hypertension: Secondary | ICD-10-CM | POA: Diagnosis not present

## 2020-06-20 DIAGNOSIS — E669 Obesity, unspecified: Secondary | ICD-10-CM

## 2020-06-20 DIAGNOSIS — Z6830 Body mass index (BMI) 30.0-30.9, adult: Secondary | ICD-10-CM

## 2020-06-20 DIAGNOSIS — R7303 Prediabetes: Secondary | ICD-10-CM | POA: Diagnosis not present

## 2020-06-20 DIAGNOSIS — Z9189 Other specified personal risk factors, not elsewhere classified: Secondary | ICD-10-CM

## 2020-06-24 ENCOUNTER — Other Ambulatory Visit: Payer: Self-pay | Admitting: Cardiovascular Disease

## 2020-06-25 NOTE — Progress Notes (Unsigned)
Chief Complaint:   OBESITY Tiffany [name] is here to discuss her progress with her obesity treatment plan along with follow-up of her obesity related diagnoses. Tiffany [name] is on the Category 2 Plan and states she is following her eating plan approximately 90% of the time. Tiffany [name] states she is not exercising regularly at this time.  Today's visit was #: 14 Starting weight: 180 lbs Starting date: 10/20/2019 Today's weight: 151 lbs Today's date: 06/20/2020 Total lbs lost to date: 29 lbs Total lbs lost since last in-office visit: 1 lb  Interim History: Tiffany [name] is down 1 pound and has done well overall.  Her goal is 145 pounds.  Subjective:   1. Vitamin D deficiency Tiffany [name]'s Vitamin D level was 73 on 10/20/2019. She is currently taking OTC vitamin D. She denies nausea, vomiting or muscle weakness.  2. Pre-diabetes Tiffany [name] has a diagnosis of prediabetes based on her elevated HgA1c and was informed this puts her at greater risk of developing diabetes. She continues to work on diet and exercise to decrease her risk of diabetes. She denies nausea or hypoglycemia.  She is taking metformin 500 mg daily.  Lab Results  Component Value Date   HGBA1C 5.3 06/20/2020   3. Essential hypertension Controlled.  Review: taking medications as instructed, no medication side effects noted, no chest pain on exertion, no dyspnea on exertion, no swelling of ankles.  She is taking medications as directed.  BP Readings from Last 3 Encounters:  06/20/20 114/71  05/30/20 107/70  04/25/20 129/76   4. At risk of diabetes mellitus Tiffany [name] is at higher than average risk for developing diabetes due to obesity and her diagnosis of prediabetes.   Assessment/Plan:   1. Vitamin D deficiency Low Vitamin D level contributes to fatigue and are associated with obesity, breast, and colon cancer.  She will continue OTC vitamin D and we will check her vitamin D level today.  - Vitamin D, 25-OH,Total,IA(Refl)  2.  Pre-diabetes Tiffany [name] will continue to work on weight loss, exercise, and decreasing simple carbohydrates to help decrease the risk of diabetes.  Continue metformin.  Will check A1c and insulin level today.  - Hemoglobin A1c - Insulin, random  3. Essential hypertension [name] is working on healthy weight loss and exercise to improve blood pressure control. We will watch for signs of hypotension as she continues her lifestyle modifications.  Continue medications.  Check CMP today.  - Comprehensive metabolic panel  4. At risk of diabetes mellitus Tiffany [name] was given approximately 15 minutes of diabetes education and counseling today. We discussed intensive lifestyle modifications today with an emphasis on weight loss as well as increasing exercise and decreasing simple carbohydrates in her diet. We also reviewed medication options with an emphasis on risk versus benefit of those discussed.   Repetitive spaced learning was employed today to elicit superior memory formation and behavioral change.  5. Class 1 obesity with serious comorbidity and body mass index (BMI) of 30.0 to 30.9 in adult, unspecified obesity type  Tiffany [name] is currently in the action stage of change. As such, her goal is to continue with weight loss efforts. She has agreed to the Category 2 Plan.   She will work on meal planning and intentional eating.  Exercise goals: All adults should avoid inactivity. Some physical activity is better than none, and adults who participate in any amount of physical activity gain some health benefits. Resistance bands.  Behavioral modification strategies: increasing lean protein intake, decreasing simple carbohydrates, increasing vegetables, increasing water  intake, decreasing eating out, no skipping meals, meal planning and cooking strategies, keeping healthy foods in the home, ways to avoid boredom eating and planning for success.  Tiffany [name] has agreed to follow-up with our clinic in 2-3 weeks  with Jake Bathe, FNP or Mina Marble, NP. She was informed of the importance of frequent follow-up visits to maximize her success with intensive lifestyle modifications for her multiple health conditions.   Tiffany [name] was informed we would discuss her lab results at her next visit unless there is a critical issue that needs to be addressed sooner. Tiffany [name] agreed to keep her next visit at the agreed upon time to discuss these results.  Objective:   Blood pressure 114/71, pulse 73, temperature 98.3 F (36.8 C), height 4\' 11"  (1.499 m), weight 151 lb (68.5 kg), SpO2 100 %. Body mass index is 30.5 kg/m.  General: Cooperative, alert, well developed, in no acute distress. HEENT: Conjunctivae and lids unremarkable. Cardiovascular: Regular rhythm.  Lungs: Normal work of breathing. Neurologic: No focal deficits.   Lab Results  Component Value Date   CREATININE 0.82 06/20/2020   BUN 11 06/20/2020   NA 139 06/20/2020   K 4.7 06/20/2020   CL 102 06/20/2020   CO2 27 06/20/2020   Lab Results  Component Value Date   ALT 15 06/20/2020   AST 15 06/20/2020   ALKPHOS 104 01/04/2019   BILITOT 0.6 06/20/2020   Lab Results  Component Value Date   HGBA1C 5.3 06/20/2020   HGBA1C 5.5 10/20/2019   Lab Results  Component Value Date   TSH 1.49 10/20/2019   Lab Results  Component Value Date   CHOL 177 10/20/2019   HDL 60 10/20/2019   LDLCALC 97 10/20/2019   TRIG 106 10/20/2019   CHOLHDL 3.0 10/20/2019   Lab Results  Component Value Date   WBC 8.0 10/20/2019   HGB 13.0 10/20/2019   HCT 40.5 10/20/2019   MCV 81.0 10/20/2019   PLT 450 (H) 10/20/2019   Attestation Statements:   Reviewed by clinician on day of visit: allergies, medications, problem list, medical history, surgical history, family history, social history, and previous encounter notes.  I, Water quality scientist, CMA, am acting as Location manager for [employer], DO  I have reviewed the above documentation for accuracy and  completeness, and I agree with the above. Jearld Lesch, DO

## 2020-06-27 ENCOUNTER — Encounter (INDEPENDENT_AMBULATORY_CARE_PROVIDER_SITE_OTHER): Payer: Self-pay | Admitting: Bariatrics

## 2020-07-05 LAB — COMPLETE METABOLIC PANEL WITH GFR
AG Ratio: 1.8 (calc) (ref 1.0–2.5)
ALT: 15 U/L (ref 6–29)
AST: 15 U/L (ref 10–35)
Albumin: 4.7 g/dL (ref 3.6–5.1)
Alkaline phosphatase (APISO): 90 U/L (ref 37–153)
BUN: 11 mg/dL (ref 7–25)
CO2: 27 mmol/L (ref 20–32)
Calcium: 10.2 mg/dL (ref 8.6–10.4)
Chloride: 102 mmol/L (ref 98–110)
Creat: 0.82 mg/dL (ref 0.50–1.05)
GFR, Est African American: 95 mL/min/{1.73_m2} (ref >=60)
GFR, Est Non African American: 82 mL/min/{1.73_m2} (ref >=60)
Globulin: 2.6 g/dL (calc) (ref 1.9–3.7)
Glucose, Bld: 84 mg/dL (ref 65–99)
Potassium: 4.7 mmol/L (ref 3.5–5.3)
Sodium: 139 mmol/L (ref 135–146)
Total Bilirubin: 0.6 mg/dL (ref 0.2–1.2)
Total Protein: 7.3 g/dL (ref 6.1–8.1)

## 2020-07-05 LAB — HEMOGLOBIN A1C W/OUT EAG: Hgb A1c MFr Bld: 5.3 % of total Hgb (ref <5.7)

## 2020-07-05 LAB — VITAMIN D 25 HYDROXY (VIT D DEFICIENCY, FRACTURES): Vit D, 25-Hydroxy: 75 ng/mL (ref 30–100)

## 2020-07-05 LAB — INSULIN, FREE (BIOACTIVE): Insulin, Free: 5.2 u[IU]/mL (ref 1.5–14.9)

## 2020-07-09 ENCOUNTER — Encounter (INDEPENDENT_AMBULATORY_CARE_PROVIDER_SITE_OTHER): Payer: Self-pay | Admitting: Adult Health

## 2020-07-09 ENCOUNTER — Other Ambulatory Visit: Payer: Self-pay

## 2020-07-09 ENCOUNTER — Ambulatory Visit (INDEPENDENT_AMBULATORY_CARE_PROVIDER_SITE_OTHER): Payer: 59 | Admitting: Adult Health

## 2020-07-09 VITALS — BP 117/61 | HR 67 | Temp 98.3°F | Ht 59.0 in | Wt 152.0 lb

## 2020-07-09 DIAGNOSIS — R7303 Prediabetes: Secondary | ICD-10-CM | POA: Diagnosis not present

## 2020-07-09 DIAGNOSIS — I10 Essential (primary) hypertension: Secondary | ICD-10-CM

## 2020-07-09 DIAGNOSIS — E559 Vitamin D deficiency, unspecified: Secondary | ICD-10-CM

## 2020-07-09 DIAGNOSIS — E669 Obesity, unspecified: Secondary | ICD-10-CM

## 2020-07-09 DIAGNOSIS — Z6830 Body mass index (BMI) 30.0-30.9, adult: Secondary | ICD-10-CM

## 2020-07-09 NOTE — Progress Notes (Signed)
Chief Complaint:   OBESITY Tiffany [name] is here to discuss her progress with her obesity treatment plan along with follow-up of her obesity related diagnoses. Tiffany [name] is on the Category 2 Plan and states she is following her eating plan approximately 90% of the time. Tiffany [name] states she is exercising 0 minutes 0 times per week.  Today's visit was #: 15 Starting weight: 180 lbs Starting date: 10/20/2019 Today's weight: 152 lbs Today's date: 07/09/2020 Total lbs lost to date: 28 lbs Total lbs lost since last in-office visit: 0  Interim History: Pt reports walking frequently at work, however, denies any "traditional exercise". She will eat off plan 1-2 times a week, for example, shrimp or BBQ chicken salad from [employer] (490 cal/29 g protein). Her ultimate goal is to lose down to 145 lbs. Her current weight is 152, BMI 30.7.  Subjective:   1. Pre-diabetes Discussed labs with patient today. 06/20/2020 BG 84 and A1c 5.3. Both are at goal. CMP resulted a GFR of 95. She is on Metformin 500 mg at lunch.  Lab Results  Component Value Date   HGBA1C 5.3 06/20/2020    2. Vitamin D deficiency Discussed labs with patient today. Tiffany [name]'s Vitamin D level was 45 on 10/20/2019. She is currently taking OTC vitamin D each day and Emergen-C Immune Plus with Vit D. She denies nausea, vomiting or muscle weakness.  Ref. Range 10/20/2019 08:46  Vitamin D, 25-Hydroxy Latest Ref Range: 30 - 100 ng/mL 45   3. Essential hypertension Discussed labs with patient today. BP and heart rate are excellent in OV today. Pt is on amlodipine 5 mg and 2.5 mg daily. CMP on 06/20/2020 was stable. Amlodipine 10 mg was reduced on 04/10/2020 due to soft BP.  BP Readings from Last 3 Encounters:  07/09/20 117/61  06/20/20 114/71  05/30/20 107/70    Assessment/Plan:   1. Pre-diabetes Tiffany [name] will continue to work on weight loss, exercise, and decreasing simple carbohydrates to help decrease the risk of diabetes. Continue  Metformin, Category 2, and increase regular exercise.  2. Vitamin D deficiency Low Vitamin D level contributes to fatigue and are associated with obesity, breast, and colon cancer. She agrees to continue to take OTC multi-vitamin every other day. and will follow-up for routine testing of Vitamin D, at least 2-3 times per year to avoid over-replacement.  3. Essential hypertension Tiffany [name] is working on healthy weight loss and exercise to improve blood pressure control. We will watch for signs of hypotension as she continues her lifestyle modifications. Continue amlodipine 7.5 mg (5 mg and 2.5 mg), reduced from 10 mg.  4. Class 1 obesity with serious comorbidity and body mass index (BMI) of 30.0 to 30.9 in adult, unspecified obesity type Tiffany [name] is currently in the action stage of change. As such, her goal is to continue with weight loss efforts. She has agreed to the Category 2 Plan.   Increase exercise on Tuesday and Thursday to 15 minutes.  Exercise goals: Tues/Thurs do 15 minutes of exercise: 1. Banded exercise; 2. gym; 3. YouTube exercise  Behavioral modification strategies: increasing lean protein intake, meal planning and cooking strategies and planning for success.  Tiffany [name] has agreed to follow-up with our clinic in 2 weeks. She was informed of the importance of frequent follow-up visits to maximize her success with intensive lifestyle modifications for her multiple health conditions.   Objective:   Blood pressure 117/61, pulse 67, temperature 98.3 F (36.8 C), height 4\' 11"  (1.499 m), weight 152 lb (68.9  kg), SpO2 98 %. Body mass index is 30.7 kg/m.  General: Cooperative, alert, well developed, in no acute distress. HEENT: Conjunctivae and lids unremarkable. Cardiovascular: Regular rhythm.  Lungs: Normal work of breathing. Neurologic: No focal deficits.   Lab Results  Component Value Date   CREATININE 0.82 06/20/2020   BUN 11 06/20/2020   NA 139 06/20/2020   K 4.7  06/20/2020   CL 102 06/20/2020   CO2 27 06/20/2020   Lab Results  Component Value Date   ALT 15 06/20/2020   AST 15 06/20/2020   ALKPHOS 104 01/04/2019   BILITOT 0.6 06/20/2020   Lab Results  Component Value Date   HGBA1C 5.3 06/20/2020   HGBA1C 5.5 10/20/2019   No results found for: INSULIN Lab Results  Component Value Date   TSH 1.49 10/20/2019   Lab Results  Component Value Date   CHOL 177 10/20/2019   HDL 60 10/20/2019   LDLCALC 97 10/20/2019   TRIG 106 10/20/2019   CHOLHDL 3.0 10/20/2019   Lab Results  Component Value Date   WBC 8.0 10/20/2019   HGB 13.0 10/20/2019   HCT 40.5 10/20/2019   MCV 81.0 10/20/2019   PLT 450 (H) 10/20/2019   No results found for: IRON, TIBC, FERRITIN   Attestation Statements:   Reviewed by clinician on day of visit: allergies, medications, problem list, medical history, surgical history, family history, social history, and previous encounter notes.  Time spent on visit including pre-visit chart review and post-visit care and charting was 32 minutes.   Coral Ceo, am acting as Location manager for Mina Marble, NP.  I have reviewed the above documentation for accuracy and completeness, and I agree with the above. -  Coltin Casher d. Tyre Beaver, NP-C

## 2020-07-23 ENCOUNTER — Other Ambulatory Visit: Payer: Self-pay

## 2020-07-23 ENCOUNTER — Ambulatory Visit (AMBULATORY_SURGERY_CENTER): Payer: Self-pay | Admitting: *Deleted

## 2020-07-23 VITALS — Ht 59.0 in | Wt 158.0 lb

## 2020-07-23 DIAGNOSIS — Z800 Family history of malignant neoplasm of digestive organs: Secondary | ICD-10-CM

## 2020-07-23 DIAGNOSIS — Z1211 Encounter for screening for malignant neoplasm of colon: Secondary | ICD-10-CM

## 2020-07-23 NOTE — Progress Notes (Signed)
Patient is here in-person for PV. Patient denies any allergies to eggs or soy. Patient denies any problems with anesthesia/sedation. Patient denies any oxygen use at home. Patient denies taking any diet/weight loss medications or blood thinners. Patient is not being treated for MRSA or C-diff. Patient is aware of our care-partner policy and TYOMA-00 safety protocol. EMMI education assigned to the patient for the procedure, sent to [place].   COVID-19 vaccines completed x2, per patient.

## 2020-07-31 ENCOUNTER — Other Ambulatory Visit: Payer: Self-pay

## 2020-07-31 ENCOUNTER — Ambulatory Visit (INDEPENDENT_AMBULATORY_CARE_PROVIDER_SITE_OTHER): Payer: 59 | Admitting: Adult Health

## 2020-07-31 ENCOUNTER — Encounter (INDEPENDENT_AMBULATORY_CARE_PROVIDER_SITE_OTHER): Payer: Self-pay | Admitting: Adult Health

## 2020-07-31 VITALS — BP 103/65 | HR 73 | Temp 98.0°F | Ht 59.0 in | Wt 152.0 lb

## 2020-07-31 DIAGNOSIS — Z6830 Body mass index (BMI) 30.0-30.9, adult: Secondary | ICD-10-CM

## 2020-07-31 DIAGNOSIS — I10 Essential (primary) hypertension: Secondary | ICD-10-CM

## 2020-07-31 DIAGNOSIS — Z9189 Other specified personal risk factors, not elsewhere classified: Secondary | ICD-10-CM | POA: Diagnosis not present

## 2020-07-31 DIAGNOSIS — R7303 Prediabetes: Secondary | ICD-10-CM

## 2020-07-31 DIAGNOSIS — E669 Obesity, unspecified: Secondary | ICD-10-CM

## 2020-08-01 ENCOUNTER — Encounter (INDEPENDENT_AMBULATORY_CARE_PROVIDER_SITE_OTHER): Payer: Self-pay | Admitting: Adult Health

## 2020-08-01 ENCOUNTER — Encounter: Payer: Self-pay | Admitting: Gastroenterology

## 2020-08-01 NOTE — Telephone Encounter (Signed)
Last OV with Katy 

## 2020-08-01 NOTE — Progress Notes (Signed)
Chief Complaint:   OBESITY Tiffany [name] is here to discuss her progress with her obesity treatment plan along with follow-up of her obesity related diagnoses. Tiffany [name] is on the Category 2 Plan and states she is following her eating plan approximately 80% of the time. Tiffany [name] states she is doing 0 minutes 0 times per week.  Today's visit was #: 16 Starting weight: 180 lbs Starting date: 10/20/2019 Today's weight: 152 lbs Today's date: 07/31/2020 Total lbs lost to date: 28 lbs Total lbs lost since last in-office visit: 0  Interim History: Tiffany [name] has a screening colonoscopy 07/23/2020. She has been following a hybrid plan of category 2 with journaling 1100 cal/85 g protein. She has maintained her weight. She would like to lose down to <150 lbs. Current weight today- 152  Subjective:   1. Pre-diabetes Tiffany [name]'s A1c and insulin level have both improved over the last several month. 06/20/2020 A1c 5.3. She is on Metformin 500 mg with lunch.  2. Essential hypertension Tiffany [name]'s BP is soft today. Her ambulatory BP's run SBP 100-110 DBP 60-70. She was on amlodipine 10 mg, then decreased to 7.5 mg daily.  She sent My Chart Message "I'm on my last refill which was ordered from [place], NP in the amount of 10MG . I didn't realize it was for that amount until [facility] as of yesterday wanted me to just take 96ml instead of the 18ml that I'm supposed to be on. [facility] filled the wrong prescription. So not realizing that I've been taking a total 12.5 instead of 7 since December. I need a new refill for 66ml Amodipine"  It appears she has been taking Amlodipine 12.5mg  QD- which may account for the soft BP readings.  BP Readings from Last 3 Encounters:  07/31/20 103/65  07/09/20 117/61  06/20/20 114/71    3. At risk for complication associated with hypotension The patient is at a higher than average risk of hypotension due to weight loss and steady weight loss and being on CCB.   Assessment/Plan:   1.  Pre-diabetes Tiffany [name] will continue to work on weight loss, exercise, and decreasing simple carbohydrates to help decrease the risk of diabetes. Continue Metformin and monitor labs.  2. Essential hypertension Tiffany [name] is working on healthy weight loss and exercise to improve blood pressure control. We will watch for signs of hypotension as she continues her lifestyle modifications. Monitor ambulatory BP. Call the clinic if systolic BP is consistently >140.   Will refill Amlodipine for 7.5mg - 5mg  + 2.5mg  tablet- closely monitor ambulatory BP.  3. At risk for complication associated with hypotension Tiffany [name] was given approximately 15 minutes of education and counseling today to help avoid hypotension. We discussed risks of hypotension with weight loss and signs of hypotension such as feeling lightheaded or unsteady.  Repetitive spaced learning was employed today to elicit superior memory formation and behavioral change.  4. Class 1 obesity with serious comorbidity and body mass index (BMI) of 30.0 to 30.9 in adult, unspecified obesity type Tiffany [name] is currently in the action stage of change. As such, her goal is to continue with weight loss efforts. She has agreed to the Category 2 Plan and keeping a food journal and adhering to recommended goals of 1050-1100 calories and 90 g protein.   Exercise goals: No exercise has been prescribed at this time.  Behavioral modification strategies: increasing lean protein intake, meal planning and cooking strategies and planning for success.  Tiffany [name] has agreed to follow-up with our clinic in 3 weeks. She  was informed of the importance of frequent follow-up visits to maximize her success with intensive lifestyle modifications for her multiple health conditions.   Objective:   Blood pressure 103/65, pulse 73, temperature 98 F (36.7 C), height 4\' 11"  (1.499 m), weight 152 lb (68.9 kg), SpO2 98 %. Body mass index is 30.7 kg/m.  General: Cooperative, alert,  well developed, in no acute distress. HEENT: Conjunctivae and lids unremarkable. Cardiovascular: Regular rhythm.  Lungs: Normal work of breathing. Neurologic: No focal deficits.   Lab Results  Component Value Date   CREATININE 0.82 06/20/2020   BUN 11 06/20/2020   NA 139 06/20/2020   K 4.7 06/20/2020   CL 102 06/20/2020   CO2 27 06/20/2020   Lab Results  Component Value Date   ALT 15 06/20/2020   AST 15 06/20/2020   ALKPHOS 104 01/04/2019   BILITOT 0.6 06/20/2020   Lab Results  Component Value Date   HGBA1C 5.3 06/20/2020   HGBA1C 5.5 10/20/2019   No results found for: INSULIN Lab Results  Component Value Date   TSH 1.49 10/20/2019   Lab Results  Component Value Date   CHOL 177 10/20/2019   HDL 60 10/20/2019   LDLCALC 97 10/20/2019   TRIG 106 10/20/2019   CHOLHDL 3.0 10/20/2019   Lab Results  Component Value Date   WBC 8.0 10/20/2019   HGB 13.0 10/20/2019   HCT 40.5 10/20/2019   MCV 81.0 10/20/2019   PLT 450 (H) 10/20/2019    Attestation Statements:   Reviewed by clinician on day of visit: allergies, medications, problem list, medical history, surgical history, family history, social history, and previous encounter notes.  Coral Ceo, am acting as Location manager for Mina Marble, NP.  I have reviewed the above documentation for accuracy and completeness, and I agree with the above. -  Sabriya Yono d. Kimberlly Norgard, NP-C

## 2020-08-01 NOTE — Telephone Encounter (Signed)
Refill request

## 2020-08-02 ENCOUNTER — Other Ambulatory Visit (INDEPENDENT_AMBULATORY_CARE_PROVIDER_SITE_OTHER): Payer: Self-pay | Admitting: Adult Health

## 2020-08-02 DIAGNOSIS — R7303 Prediabetes: Secondary | ICD-10-CM

## 2020-08-02 DIAGNOSIS — I10 Essential (primary) hypertension: Secondary | ICD-10-CM

## 2020-08-02 MED ORDER — AMLODIPINE BESYLATE 2.5 MG PO TABS: 2.5000 mg | ORAL_TABLET | Freq: Every day | ORAL | 0 refills | Status: DC

## 2020-08-02 MED ORDER — METFORMIN HCL 500 MG PO TABS: ORAL_TABLET | ORAL | 0 refills | Status: DC

## 2020-08-02 MED ORDER — AMLODIPINE BESYLATE 5 MG PO TABS: 5.0000 mg | ORAL_TABLET | Freq: Every day | ORAL | 0 refills | Status: DC

## 2020-08-06 ENCOUNTER — Ambulatory Visit (AMBULATORY_SURGERY_CENTER): Payer: 59 | Admitting: Gastroenterology

## 2020-08-06 ENCOUNTER — Other Ambulatory Visit: Payer: Self-pay

## 2020-08-06 ENCOUNTER — Encounter: Payer: Self-pay | Admitting: Gastroenterology

## 2020-08-06 VITALS — BP 130/82 | HR 69 | Temp 98.9°F | Resp 11 | Ht 59.0 in | Wt 158.0 lb

## 2020-08-06 DIAGNOSIS — Z800 Family history of malignant neoplasm of digestive organs: Secondary | ICD-10-CM

## 2020-08-06 DIAGNOSIS — K621 Rectal polyp: Secondary | ICD-10-CM

## 2020-08-06 DIAGNOSIS — D129 Benign neoplasm of anus and anal canal: Secondary | ICD-10-CM

## 2020-08-06 DIAGNOSIS — Z1211 Encounter for screening for malignant neoplasm of colon: Secondary | ICD-10-CM

## 2020-08-06 DIAGNOSIS — D128 Benign neoplasm of rectum: Secondary | ICD-10-CM

## 2020-08-06 MED ORDER — SODIUM CHLORIDE 0.9 % IV SOLN
500.0000 mL | Freq: Once | INTRAVENOUS | Status: DC
Start: 2020-08-06 — End: 2020-08-06

## 2020-08-06 NOTE — Progress Notes (Signed)
Called to room to assist during endoscopic procedure.  Patient ID and intended procedure confirmed with present staff. Received instructions for my participation in the procedure from the performing physician.  

## 2020-08-06 NOTE — Progress Notes (Signed)
Pt's states no medical or surgical changes since previsit or office visit. 

## 2020-08-06 NOTE — Progress Notes (Signed)
pt tolerated well. VSS. awake and to recovery. Report given to RN.  

## 2020-08-06 NOTE — Op Note (Signed)
[facility] Patient Name: Tiffany [name] Procedure Date: 08/06/2020 9:45 AM MRN: [id number] Endoscopist: Jackquline Denmark , MD Age: 55 Referring MD:  Date of Birth: [birth year] Gender: Female Account #: [id number] Procedure:                Colonoscopy Indications:              Screening in patient at increased risk: FH colon                            cancer. Medicines:                Monitored Anesthesia Care Procedure:                Pre-Anesthesia Assessment:                           - Prior to the procedure, a History and Physical                            was performed, and patient medications and                            allergies were reviewed. The patient's tolerance of                            previous anesthesia was also reviewed. The risks                            and benefits of the procedure and the sedation                            options and risks were discussed with the patient.                            All questions were answered, and informed consent                            was obtained. Prior Anticoagulants: The patient has                            taken no previous anticoagulant or antiplatelet                            agents. ASA Grade Assessment: II - A patient with                            mild systemic disease. After reviewing the risks                            and benefits, the patient was deemed in                            satisfactory condition to undergo the procedure.  After obtaining informed consent, the colonoscope                            was passed under direct vision. Throughout the                            procedure, the patient's blood pressure, pulse, and                            oxygen saturations were monitored continuously. The                            Olympus PFC-[id number] (#[id number]) Colonoscope was                            introduced through the anus and advanced to the 2                             cm into the ileum. The colonoscopy was performed                            without difficulty. The patient tolerated the                            procedure well. The quality of the bowel                            preparation was good. The terminal ileum, ileocecal                            valve, appendiceal orifice, and rectum were                            photographed. The colon was highly redundant.                            Passage of scope was assisted by abdominal pressure. Scope In: 9:52:04 AM Scope Out: 10:08:35 AM Scope Withdrawal Time: 0 hours 7 minutes 56 seconds  Total Procedure Duration: 0 hours 16 minutes 31 seconds  Findings:                 A 6 mm polyp was found in the mid rectum. The polyp                            was sessile. The polyp was removed with a cold                            snare. Resection and retrieval were complete.                           Non-bleeding internal hemorrhoids were found during                            retroflexion. The  hemorrhoids were small.                           The terminal ileum appeared normal.                           The exam was otherwise without abnormality on                            direct and retroflexion views. The colon was highly                            redundant. Complications:            No immediate complications. Estimated Blood Loss:     Estimated blood loss: none. Impression:               - One 6 mm polyp in the mid rectum, removed with a                            cold snare. Resected and retrieved.                           - Non-bleeding internal hemorrhoids.                           - The examined portion of the ileum was normal.                           - The examination was otherwise normal on direct                            and retroflexion views. Recommendation:           - Patient has a contact number available for                            emergencies.  The signs and symptoms of potential                            delayed complications were discussed with the                            patient. Return to normal activities tomorrow.                            Written discharge instructions were provided to the                            patient.                           - Resume previous diet.                           - Continue present medications.                           -  Await pathology results.                           - Repeat colonoscopy in 3 years for screening                            purposes d/t FH.                           - Return to GI clinic PRN. Jackquline Denmark, MD 08/06/2020 10:13:26 AM This report has been signed electronically.

## 2020-08-06 NOTE — Patient Instructions (Signed)
YOU HAD AN ENDOSCOPIC PROCEDURE TODAY AT THE [facility]:   Refer to the procedure report that was given to you for any specific questions about what was found during the examination.  If the procedure report does not answer your questions, please call your gastroenterologist to clarify.  If you requested that your care partner not be given the details of your procedure findings, then the procedure report has been included in a sealed envelope for you to review at your convenience later.  YOU SHOULD EXPECT: Some feelings of bloating in the abdomen. Passage of more gas than usual.  Walking can help get rid of the air that was put into your GI tract during the procedure and reduce the bloating. If you had a lower endoscopy (such as a colonoscopy or flexible sigmoidoscopy) you may notice spotting of blood in your stool or on the toilet paper. If you underwent a bowel prep for your procedure, you may not have a normal bowel movement for a few days.  Please Note:  You might notice some irritation and congestion in your nose or some drainage.  This is from the oxygen used during your procedure.  There is no need for concern and it should clear up in a day or so.  SYMPTOMS TO REPORT IMMEDIATELY:   Following lower endoscopy (colonoscopy or flexible sigmoidoscopy):  Excessive amounts of blood in the stool  Significant tenderness or worsening of abdominal pains  Swelling of the abdomen that is new, acute  Fever of 100F or higher  For urgent or emergent issues, a gastroenterologist can be reached at any hour by calling [phone]. Do not use MyChart messaging for urgent concerns.    DIET:  We do recommend a small meal at first, but then you may proceed to your regular diet.  Drink plenty of fluids but you should avoid alcoholic beverages for 24 hours.  ACTIVITY:  You should plan to take it easy for the rest of today and you should NOT DRIVE or use heavy machinery until tomorrow (because  of the sedation medicines used during the test).    FOLLOW UP: Our staff will call the number listed on your records 48-72 hours following your procedure to check on you and address any questions or concerns that you may have regarding the information given to you following your procedure. If we do not reach you, we will leave a message.  We will attempt to reach you two times.  During this call, we will ask if you have developed any symptoms of COVID 19. If you develop any symptoms (ie: fever, flu-like symptoms, shortness of breath, cough etc.) before then, please call [phone].  If you test positive for Covid 19 in the 2 weeks post procedure, please call and report this information to us.    If any biopsies were taken you will be contacted by phone or by letter within the next 1-3 weeks.  Please call us at [phone] if you have not heard about the biopsies in 3 weeks.    SIGNATURES/CONFIDENTIALITY: You and/or your care partner have signed paperwork which will be entered into your electronic medical record.  These signatures attest to the fact that that the information above on your After Visit Summary has been reviewed and is understood.  Full responsibility of the confidentiality of this discharge information lies with you and/or your care-partner. 

## 2020-08-08 ENCOUNTER — Telehealth: Payer: Self-pay | Admitting: *Deleted

## 2020-08-08 NOTE — Telephone Encounter (Signed)
Left message on f/u call 

## 2020-08-08 NOTE — Telephone Encounter (Signed)
  Follow up Call-  Call back number 08/06/2020  Post procedure Call Back phone  # [phone]  Permission to leave phone message Yes  Some recent data might be hidden     No answer at 2nd attempt follow up phone call.  Left message on voicemail.

## 2020-08-12 ENCOUNTER — Other Ambulatory Visit: Payer: Self-pay | Admitting: Family

## 2020-08-12 DIAGNOSIS — I10 Essential (primary) hypertension: Secondary | ICD-10-CM

## 2020-08-16 ENCOUNTER — Other Ambulatory Visit: Payer: Self-pay | Admitting: Adult Health

## 2020-08-16 DIAGNOSIS — G40309 Generalized idiopathic epilepsy and epileptic syndromes, not intractable, without status epilepticus: Secondary | ICD-10-CM

## 2020-08-17 ENCOUNTER — Ambulatory Visit (INDEPENDENT_AMBULATORY_CARE_PROVIDER_SITE_OTHER): Payer: 59 | Admitting: Family

## 2020-08-17 ENCOUNTER — Other Ambulatory Visit: Payer: Self-pay

## 2020-08-17 ENCOUNTER — Encounter: Payer: Self-pay | Admitting: Family

## 2020-08-17 VITALS — BP 123/70 | HR 81 | Temp 98.5°F | Resp 16 | Ht 59.0 in | Wt 161.2 lb

## 2020-08-17 DIAGNOSIS — J45991 Cough variant asthma: Secondary | ICD-10-CM

## 2020-08-17 DIAGNOSIS — Z0000 Encounter for general adult medical examination without abnormal findings: Secondary | ICD-10-CM

## 2020-08-17 DIAGNOSIS — E663 Overweight: Secondary | ICD-10-CM | POA: Diagnosis not present

## 2020-08-17 DIAGNOSIS — Z23 Encounter for immunization: Secondary | ICD-10-CM

## 2020-08-17 DIAGNOSIS — G40909 Epilepsy, unspecified, not intractable, without status epilepticus: Secondary | ICD-10-CM | POA: Diagnosis not present

## 2020-08-17 NOTE — Progress Notes (Signed)
Subjective:    Patient ID: Tiffany [name], female    DOB: Feb 08, 1966, 55 y.o.   MRN: [id number]  HPI   Patient is a 55 yr old female who presents today for follow up.    HTN- on amlodipine 2.5mg  (was reduced by weight management). Also on metoprolol 50mg  bid.   BP Readings from Last 3 Encounters:  08/17/20 123/70  08/06/20 130/82  07/31/20 103/65   Seizure disorder- stable on lamictal 100mg - has follow up with neurology.   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  She was placed on symbicort for nocturnal cough and notes improvement.    Review of Systems Past Medical History:  Diagnosis Date  . Dyspnea   . Epilepsy ([place])   . Hypertension   . Microscopic hematuria 09/25/2016   Had complete work up with Dr. Wendy Poet.   Cysto unrevealing,  CT scan nl   . Non Hodgkin's lymphoma ([place]) 2/08   stage III  . Obesity   . Palpitations   . Seizure disorder ([place])    last Seizure 10 years ago around 2012 per pt  . Shortness of breath 07/05/2019     Social History   Socioeconomic History  . Marital status: Married    Spouse name: Iona Beard  . Number of children: 2  . Years of education: college  . Highest education level: Not on file  Occupational History  . Occupation: Engineer, building services: [employer]  Tobacco Use  . Smoking status: Never Smoker  . Smokeless tobacco: Never Used  Vaping Use  . Vaping Use: Never used  Substance and Sexual Activity  . Alcohol use: No  . Drug use: No  . Sexual activity: Yes    Partners: Male  Other Topics Concern  . Not on file  Social History Narrative   2 children   Married   Works as a Curator, movies   Social Determinants of Radio broadcast assistant Strain: Not on file  Food Insecurity: Not on file  Transportation Needs: Not on file  Physical Activity: Not on  file  Stress: Not on file  Social Connections: Not on file  Intimate Partner Violence: Not on file    Past Surgical History:  Procedure Laterality Date  . [place], 2002  . PORT-A-CATH REMOVAL  2009  . PORTACATH PLACEMENT  2008    Family History  Problem Relation Age of Onset  . Breast cancer Mother   . Hypertension Mother   . COPD Mother   . Diabetes Mother   . Hepatitis C Father   . Diabetes Father   . Hypertension Father   . Hyperlipidemia Father   . Heart disease Father   . Stroke Father   . Liver disease Father   . Alcoholism Father   . Colon cancer Father 58  . Hypertension Maternal Grandmother   . Hypertension Paternal Grandmother   . Colon polyps Neg Hx   . Esophageal cancer Neg Hx   . Stomach cancer Neg Hx   . Rectal cancer Neg Hx     No Known Allergies  Current Outpatient Medications on File Prior to Visit  Medication Sig Dispense Refill  . albuterol (VENTOLIN HFA) 108 (90 Base) MCG/ACT inhaler Inhale 2 puffs into the lungs every 4 (four) hours as needed  for wheezing or shortness of breath. 18 g 11  . amLODipine (NORVASC) 2.5 MG tablet Take 1 tablet (2.5 mg total) by mouth daily. 90 tablet 0  . amLODipine (NORVASC) 5 MG tablet Take 1 tablet (5 mg total) by mouth daily. 90 tablet 0  . budesonide-formoterol (SYMBICORT) 160-4.5 MCG/ACT inhaler Inhale 2 puffs into the lungs 2 (two) times daily. 3 each 3  . lamoTRIgine (LAMICTAL) 100 MG tablet TAKE 1 TABLET BY MOUTH TWICE A DAY 60 tablet 0  . metFORMIN (GLUCOPHAGE) 500 MG tablet Daily with lunch 180 tablet 0  . metoprolol tartrate (LOPRESSOR) 50 MG tablet TAKE 1 TABLET BY MOUTH TWICE A DAY 180 tablet 3  . Misc Natural Products (DAILY HERBS IMMUNE DEFENSE PO) Take 2 capsules by mouth daily.    . Multiple Vitamins-Minerals (EMERGEN-C IMMUNE PLUS) PACK Take 1 packet by mouth daily.    . Multiple Vitamins-Minerals (MULTIVITAMIN WITH MINERALS) tablet Take 1 tablet by mouth daily.     No current  facility-administered medications on file prior to visit.    BP 123/70 (BP Location: Right Arm, Patient Position: Sitting, Cuff Size: Small)   Pulse 81   Temp 98.5 F (36.9 C) (Oral)   Resp 16   Ht 4\' 11"  (1.499 m)   Wt 161 lb 3.2 oz (73.1 kg)   SpO2 99%   BMI 32.56 kg/m       Objective:   Physical Exam Constitutional:      Appearance: She is well-developed.  Cardiovascular:     Rate and Rhythm: Normal rate and regular rhythm.     Heart sounds: Normal heart sounds. No murmur heard.   Pulmonary:     Effort: Pulmonary effort is normal. No respiratory distress.     Breath sounds: Normal breath sounds. No wheezing.  Psychiatric:        Behavior: Behavior normal.        Thought Content: Thought content normal.        Judgment: Judgment normal.           Assessment & Plan:  HTN- bp stable. Continue amlodipine 2.5mg  and metoprolol 50mg  bid.  Lab work is up to date.  Seizure disorder- stable on lamictal- management per neurology.  Cough variant asthma- stable- management per pulmonary.  Overweight- commended pt on her weight loss efforts.    Td today.  Recommended that she get her covid booster  Preventative care- due for mammogram- order placed.  This visit occurred during the SARS-CoV-2 public health emergency.  Safety protocols were in place, including screening questions prior to the visit, additional usage of staff PPE, and extensive cleaning of exam room while observing appropriate contact time as indicated for disinfecting solutions.

## 2020-08-17 NOTE — Addendum Note (Signed)
Addended by: Jiles Prows on: 08/17/2020 04:14 PM   Modules accepted: Orders

## 2020-08-21 ENCOUNTER — Encounter: Payer: Self-pay | Admitting: Adult Health

## 2020-08-21 ENCOUNTER — Other Ambulatory Visit: Payer: Self-pay

## 2020-08-21 ENCOUNTER — Ambulatory Visit (INDEPENDENT_AMBULATORY_CARE_PROVIDER_SITE_OTHER): Payer: 59 | Admitting: Adult Health

## 2020-08-21 DIAGNOSIS — G40309 Generalized idiopathic epilepsy and epileptic syndromes, not intractable, without status epilepticus: Secondary | ICD-10-CM

## 2020-08-21 MED ORDER — LAMOTRIGINE 100 MG PO TABS
100.0000 mg | ORAL_TABLET | Freq: Two times a day (BID) | ORAL | 3 refills | Status: DC
Start: 2020-08-21 — End: 2021-08-19

## 2020-08-21 NOTE — Progress Notes (Signed)
[facility]  PATIENT: Tiffany [name] DOB: 1966/04/02   REASON FOR VISIT: follow-up for generalized epilepsy [facility] provider: Dr. Leonie Man  Chief complaint: Chief Complaint  Patient presents with  . Follow-up    TR alone Pt is well, no seizures, no complaints      HISTORY OF PRESENT ILLNESS:  Today, 08/21/2020, Tiffany. 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  Continues on Lamictal 100 mg twice daily tolerating well without recent seizure activity.  Last seizure activity approximately 8 years ago.  No neurological concerns at this time.  She does report ongoing difficulty with weight gain.  UPDATE 3/12/2020CM Tiffany [name], 55 year old female returns for follow-up with history of generalized seizure disorder.  Last seizure occurred 7 years ago.  She is currently on Lamictal 100 mg twice daily without side effects.  Both MRI of the brain and EEG in the past have been normal.  She continues to gain weight.  She denies any interval medical issues she exercises very little.  She returns for reevaluation  UPDATE 08/18/17 CM Tiffany [name] , 55 year old female returns for yearly follow-up.  She was last seen 08/07/2016.  She has a history of generalized seizure disorder. Her last seizure was approximately 6 years ago. She is currently on Lamictal tolerating the medication without difficulty. EEG in the past has been normal as well  as MRI of the brain. She has not had any interval medical problems. She is overweight. She claims she loves breads, pasta and sweets.  She was encouraged to try to lose weight to prevent long-term chronic medical issues.  She returns for reevaluation and refills  REVIEW OF SYSTEMS: Full 14 system review of systems performed and notable only for those listed, all others are neg:  Constitutional: neg Cardiovascular: neg Ear/Nose/Throat: neg  Skin: neg Eyes: neg Respiratory: neg Gastroitestinal: neg  Hematology/Lymphatic: neg  Endocrine: neg Musculoskeletal:neg Allergy/Immunology: neg Neurological: History of seizure disorder Psychiatric: neg Sleep : neg   ALLERGIES: No Known Allergies  HOME MEDICATIONS: Outpatient Medications Prior to Visit  Medication Sig Dispense Refill  . albuterol (VENTOLIN HFA) 108 (90 Base) MCG/ACT inhaler Inhale 2 puffs into the lungs every 4 (four) hours as needed for wheezing or shortness of breath. 18 g 11  . amLODipine (NORVASC) 2.5 MG tablet Take 1 tablet (2.5 mg total) by mouth daily. 90 tablet 0  . amLODipine (NORVASC) 5 MG tablet Take 1 tablet (5 mg total) by mouth daily. 90 tablet 0  . budesonide-formoterol (SYMBICORT) 160-4.5 MCG/ACT inhaler Inhale 2 puffs into the lungs 2 (two) times daily. 3 each 3  . metFORMIN (GLUCOPHAGE) 500 MG tablet Daily with lunch 180 tablet 0  . metoprolol tartrate (LOPRESSOR) 50 MG tablet TAKE 1 TABLET BY MOUTH TWICE A DAY 180 tablet 3  . Misc Natural Products (DAILY HERBS IMMUNE DEFENSE PO) Take 2 capsules by mouth daily.    . Multiple Vitamins-Minerals (EMERGEN-C IMMUNE PLUS) PACK Take 1 packet by mouth daily.    . Multiple Vitamins-Minerals (MULTIVITAMIN  WITH MINERALS) tablet Take 1 tablet by mouth daily.    [name] lamoTRIgine (LAMICTAL) 100 MG tablet TAKE 1 TABLET BY MOUTH TWICE A DAY 60 tablet 0   No facility-administered medications prior to visit.    PAST MEDICAL HISTORY: Past Medical History:  Diagnosis Date  .  Dyspnea   . Epilepsy ([place])   . Hypertension   . Microscopic hematuria 09/25/2016   Had complete work up with Dr. Wendy Poet.   Cysto unrevealing,  CT scan nl   . Non Hodgkin's lymphoma ([place]) 2/08   stage III  . Obesity   . Palpitations   . Seizure disorder ([place])    last Seizure 10 years ago around 2012 per pt  . Shortness of breath 07/05/2019    PAST SURGICAL HISTORY: Past Surgical History:  Procedure Laterality Date  . [place], 2002  . PORT-A-CATH REMOVAL  2009  . PORTACATH PLACEMENT  2008    FAMILY HISTORY: Family History  Problem Relation Age of Onset  . Breast cancer Mother   . Hypertension Mother   . COPD Mother   . Diabetes Mother   . Hepatitis C Father   . Diabetes Father   . Hypertension Father   . Hyperlipidemia Father   . Heart disease Father   . Stroke Father   . Liver disease Father   . Alcoholism Father   . Colon cancer Father 85  . Hypertension Maternal Grandmother   . Hypertension Paternal Grandmother   . Colon polyps Neg Hx   . Esophageal cancer Neg Hx   . Stomach cancer Neg Hx   . Rectal cancer Neg Hx     SOCIAL HISTORY: Social History   Socioeconomic History  . Marital status: Married    Spouse name: Iona Beard  . Number of children: 2  . Years of education: college  . Highest education level: Not on file  Occupational History  . Occupation: Engineer, building services: [employer]  Tobacco Use  . Smoking status: Never Smoker  . Smokeless tobacco: Never Used  Vaping Use  . Vaping Use: Never used  Substance and Sexual Activity  . Alcohol use: No  . Drug use: No  . Sexual activity: Yes    Partners: Male  Other Topics Concern  . Not on file  Social History Narrative   2 children   Married   Works as a Curator, movies   Social Determinants of Radio broadcast assistant Strain: Not on file  Food Insecurity: Not on file  Transportation Needs: Not on file  Physical Activity: Not on file  Stress:  Not on file  Social Connections: Not on file  Intimate Partner Violence: Not on file     PHYSICAL EXAM  Vitals:   08/21/20 0825  BP: 121/74  Pulse: 69  Weight: 161 lb (73 kg)  Height: 4' 11" (1.499 m)   Body mass index is 32.52 kg/m.   General: well developed, well nourished, pleasant middle-aged African-American female, seated, in no evident distress Head: head normocephalic and atraumatic.   Neck: supple with no carotid or supraclavicular bruits Cardiovascular: regular rate and rhythm, no murmurs Musculoskeletal: no deformity Skin:  no rash/petichiae Vascular:  Normal pulses all extremities   Neurologic Exam Mental Status: Awake and fully alert.   Normal speech and language.  Oriented to place and time. Recent and remote memory intact. Attention span, concentration and fund of knowledge appropriate. Mood and affect appropriate.  Cranial Nerves:  Pupils equal, briskly reactive to light. Extraocular movements full without nystagmus. Visual fields full to confrontation. Hearing intact. Facial sensation intact. Face, tongue, palate moves normally and symmetrically.  Motor: Normal bulk and tone. Normal strength in all tested extremity muscles. Sensory.: intact to touch , pinprick , position and vibratory sensation.  Coordination: Rapid alternating movements normal in all extremities. Finger-to-nose and heel-to-shin performed accurately bilaterally. Gait and Station: Arises from chair without difficulty. Stance is normal. Gait demonstrates normal stride length and balance Reflexes: 1+ and symmetric. Toes downgoing.     DIAGNOSTIC DATA (LABS, IMAGING, TESTING)  BMP Latest Ref Rng & Units 06/20/2020 10/20/2019 07/19/2019  Glucose 65 - 99 mg/dL 84 109(H) 102(H)  BUN 7 - 25 mg/dL _0 Creatinine 0.50 - 1.05 mg/dL 0.82 0.76 0.86  BUN/Creat Ratio 6 - 22 (calc) NOT APPLICABLE NOT APPLICABLE NOT APPLICABLE  Sodium 024 - 146 mmol/L 139 137 142  Potassium 3.5 - 5.3 mmol/L 4.7 4.6 4.8   Chloride 98 - 110 mmol/L 102 101 103  CO2 20 - 32 mmol/L 27 29 32  Calcium 8.6 - 10.4 mg/dL 10.2 9.8 10.0   Hepatic Function Latest Ref Rng & Units 06/20/2020 10/20/2019 01/04/2019  Total Protein 6.1 - 8.1 g/dL 7.3 7.6 7.5  Albumin 3.5 - 5.0 g/dL - - 4.3  AST 10 - 35 U/L _1 ALT 6 - 29 U/L _2 Alk Phosphatase 38 - 126 U/L - - 104  Total Bilirubin 0.2 - 1.2 mg/dL 0.6 0.5 0.5  Bilirubin, Direct 0.0 - 0.2 mg/dL - - <0.1       ASSESSMENT AND PLAN  55 y.o. year old female  has a past medical history of Non Hodgkin's lymphoma ([place]) (2/08); Obesity; Seizure disorder ([place]); and hypertension who continues to follow-up in this office for seizure management. Last seizure was 8 years ago.  She is currently on Lamictal 100 mg twice daily with excellent seizure control.    Seizures Continue Lamictal 100 mg twice daily for seizure prophylaxis -1 year refill placed Obtain lamotrigine level as yearly levels recommended.  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 Encarnacion Slates, Lenna Sciara, NP    I spent 20 minutes of face-to-face and non-face-to-face time with patient.  This included previsit chart review, lab review, study review, order entry, electronic health record documentation, patient education and discussion regarding history of seizures, ongoing use of AED, indication for routine lab work and answered all other questions to patient satisfaction   Tiffany [name], [facility]  [facility] [address] [place], [place] [address]  Phone [phone] Fax [phone] Note: This document was prepared with digital dictation and possible smart phrase technology. Any transcriptional errors that  result from this process are unintentional.

## 2020-08-21 NOTE — Patient Instructions (Signed)
Your Plan:  Continue current plan - refill provided     Thank you for coming to see [place] at [facility]. I hope we have been able to provide you high quality care today.  You may receive a patient satisfaction survey over the next few weeks. We would appreciate your feedback and comments so that we may continue to improve ourselves and the health of our patients.

## 2020-08-24 LAB — LAMOTRIGINE LEVEL: Lamotrigine Lvl: 4.8 ug/mL (ref 4.0–18.0)

## 2020-08-26 NOTE — Progress Notes (Signed)
I agree with the above plan 

## 2020-08-27 ENCOUNTER — Other Ambulatory Visit: Payer: Self-pay | Admitting: Family

## 2020-08-27 DIAGNOSIS — Z1231 Encounter for screening mammogram for malignant neoplasm of breast: Secondary | ICD-10-CM

## 2020-08-28 ENCOUNTER — Other Ambulatory Visit: Payer: Self-pay

## 2020-08-28 ENCOUNTER — Ambulatory Visit (INDEPENDENT_AMBULATORY_CARE_PROVIDER_SITE_OTHER): Payer: 59 | Admitting: Adult Health

## 2020-08-28 ENCOUNTER — Encounter (INDEPENDENT_AMBULATORY_CARE_PROVIDER_SITE_OTHER): Payer: Self-pay | Admitting: Adult Health

## 2020-08-28 VITALS — BP 123/66 | HR 71 | Temp 98.1°F | Ht 59.0 in | Wt 151.0 lb

## 2020-08-28 DIAGNOSIS — R7303 Prediabetes: Secondary | ICD-10-CM | POA: Diagnosis not present

## 2020-08-28 DIAGNOSIS — E669 Obesity, unspecified: Secondary | ICD-10-CM

## 2020-08-28 DIAGNOSIS — Z6830 Body mass index (BMI) 30.0-30.9, adult: Secondary | ICD-10-CM

## 2020-08-28 DIAGNOSIS — I10 Essential (primary) hypertension: Secondary | ICD-10-CM | POA: Diagnosis not present

## 2020-08-29 ENCOUNTER — Encounter: Payer: Self-pay | Admitting: Gastroenterology

## 2020-08-29 NOTE — Progress Notes (Signed)
Chief Complaint:   OBESITY Tiffany [name] is here to discuss her progress with her obesity treatment plan along with follow-up of her obesity related diagnoses. Tiffany [name] is on the Category 2 Plan and keeping a food journal and adhering to recommended goals of 1100 calories and 85 g protein and states she is following her eating plan approximately 90% of the time. Tiffany [name] states she is dong 0 minutes 0 times per week.  Today's visit was #: 36 Starting weight: 180 lbs Starting date: 10/20/2019 Today's weight: 151 lbs Today's date: 08/28/2020 Total lbs lost to date: 29 lbs Total lbs lost since last in-office visit: 1 lb  Interim History: Tiffany [name] has been consistently following Category 2 meal plan. She is down another 1 lb for a total of 29 lbs! Her current BMI is 30.6. Her BP is stable. She would like to lose a "few more pounds".   Subjective:   1. Essential hypertension Tiffany [name] was mistakenly on amlodipine 12.5 mg QD. The Rx was written for 7.5 mg QD. Her BP was a little soft at last few OV's. Her ambulatory home readings SBP 110-120, DBP 70's, HR 78-82. She is now on correct CCB dose.  BP Readings from Last 3 Encounters:  08/28/20 123/66  08/21/20 121/74  08/17/20 123/70    2. Pre-diabetes Tiffany [name] is on Metformin 500 mg daily at lunch and tolerating it well. Her 06/20/2020 A1c was at goal at 5.3. her 06/20/2020 CMP GFR 95.  Lab Results  Component Value Date   HGBA1C 5.3 06/20/2020    Assessment/Plan:   1. Essential hypertension Tiffany [name] is working on healthy weight loss and exercise to improve blood pressure control. We will watch for signs of hypotension as she continues her lifestyle modifications. Continue amlodipine 5 mg + 2.5 mg = 7.5 mg.  2. Pre-diabetes Tiffany [name] will continue to work on weight loss, exercise, and decreasing simple carbohydrates to help decrease the risk of diabetes. Check A1c at next OV.  3. Class 1 obesity with serious comorbidity and body mass index  (BMI) of 30.0 to 30.9 in adult, unspecified obesity type Tiffany [name] is currently in the action stage of change. As such, her goal is to continue with weight loss efforts. She has agreed to the Category 2 Plan.   Check fasting labs at next OV.  Exercise goals: Increase daily walking  Behavioral modification strategies: increasing lean protein intake, meal planning and cooking strategies and planning for success.  Tiffany [name] has agreed to follow-up with our clinic in 3 weeks. She was informed of the importance of frequent follow-up visits to maximize her success with intensive lifestyle modifications for her multiple health conditions.   Objective:   Blood pressure 123/66, pulse 71, temperature 98.1 F (36.7 C), height 4\' 11"  (1.499 m), weight 151 lb (68.5 kg), SpO2 100 %. Body mass index is 30.5 kg/m.  General: Cooperative, alert, well developed, in no acute distress. HEENT: Conjunctivae and lids unremarkable. Cardiovascular: Regular rhythm.  Lungs: Normal work of breathing. Neurologic: No focal deficits.   Lab Results  Component Value Date   CREATININE 0.82 06/20/2020   BUN 11 06/20/2020   NA 139 06/20/2020   K 4.7 06/20/2020   CL 102 06/20/2020   CO2 27 06/20/2020   Lab Results  Component Value Date   ALT 15 06/20/2020   AST 15 06/20/2020   ALKPHOS 104 01/04/2019   BILITOT 0.6 06/20/2020   Lab Results  Component Value Date   HGBA1C 5.3 06/20/2020   HGBA1C 5.5  10/20/2019   No results found for: INSULIN Lab Results  Component Value Date   TSH 1.49 10/20/2019   Lab Results  Component Value Date   CHOL 177 10/20/2019   HDL 60 10/20/2019   LDLCALC 97 10/20/2019   TRIG 106 10/20/2019   CHOLHDL 3.0 10/20/2019   Lab Results  Component Value Date   WBC 8.0 10/20/2019   HGB 13.0 10/20/2019   HCT 40.5 10/20/2019   MCV 81.0 10/20/2019   PLT 450 (H) 10/20/2019    Attestation Statements:   Reviewed by clinician on day of visit: allergies, medications, problem list,  medical history, surgical history, family history, social history, and previous encounter notes.  Time spent on visit including pre-visit chart review and post-visit care and charting was 32 minutes.   Coral Ceo, am acting as Location manager for Mina Marble, NP.  I have reviewed the above documentation for accuracy and completeness, and I agree with the above. -  Margarit Minshall d. Kinzly Pierrelouis, NP-C

## 2020-10-01 ENCOUNTER — Other Ambulatory Visit: Payer: Self-pay

## 2020-10-01 ENCOUNTER — Encounter (INDEPENDENT_AMBULATORY_CARE_PROVIDER_SITE_OTHER): Payer: Self-pay | Admitting: Adult Health

## 2020-10-01 ENCOUNTER — Ambulatory Visit (INDEPENDENT_AMBULATORY_CARE_PROVIDER_SITE_OTHER): Payer: 59 | Admitting: Adult Health

## 2020-10-01 VITALS — BP 125/70 | HR 73 | Temp 98.2°F | Ht 59.0 in | Wt 153.0 lb

## 2020-10-01 DIAGNOSIS — E559 Vitamin D deficiency, unspecified: Secondary | ICD-10-CM

## 2020-10-01 DIAGNOSIS — R5383 Other fatigue: Secondary | ICD-10-CM

## 2020-10-01 DIAGNOSIS — Z9189 Other specified personal risk factors, not elsewhere classified: Secondary | ICD-10-CM

## 2020-10-01 DIAGNOSIS — I10 Essential (primary) hypertension: Secondary | ICD-10-CM

## 2020-10-01 DIAGNOSIS — E669 Obesity, unspecified: Secondary | ICD-10-CM

## 2020-10-01 DIAGNOSIS — R7303 Prediabetes: Secondary | ICD-10-CM | POA: Diagnosis not present

## 2020-10-01 DIAGNOSIS — Z6831 Body mass index (BMI) 31.0-31.9, adult: Secondary | ICD-10-CM

## 2020-10-01 NOTE — Progress Notes (Signed)
Chief Complaint:   OBESITY Tiffany [name] is here to discuss her progress with her obesity treatment plan along with follow-up of her obesity related diagnoses. Tiffany [name] is on the Category 2 Plan and states she is following her eating plan approximately 80% of the time. Tiffany [name] states she is not currently exercising.  Today's visit was #: 18 Starting weight: 180 lbs Starting date: 5/813/2021 Today's weight: 153 lbs Today's date: 10/01/2020 Total lbs lost to date: 27  Total lbs lost since last in-office visit: 0  Interim History: Tiffany [name] celebrated her birthday last week. She was taken out to dinner 5 times. She tried to focus on choosing the healthiest meals when eating out.  Subjective:   1. Essential hypertension Tiffany [name] is on amlodipine 2.5 mg + 5 mg = 7.5 mg QD and metoprolol tartrate 50 mg BID. Pt denies cardiac symptoms.  BP Readings from Last 3 Encounters:  10/01/20 125/70  08/28/20 123/66  08/21/20 121/74    2. Vitamin D deficiency Tiffany [name]'s Vitamin D level was 75 on 06/20/2020. She is currently taking several OTC multivitamins. She denies nausea, vomiting or muscle weakness.   Ref. Range 06/20/2020 08:56  Vitamin D, 25-Hydroxy Latest Ref Range: 30 - 100 ng/mL 75   3. Pre-diabetes Tiffany [name] is on Metformin 500 mg everyday at lunch. 06/20/2020 A1c was 5.3- at goal.  Lab Results  Component Value Date   HGBA1C 5.3 06/20/2020   No results found for: INSULIN  4. Other fatigue Tiffany [name] reports occasional fatigue. She denies known history of anemia.  5. At risk for diabetes mellitus Tiffany [name] is at higher than average risk for developing diabetes due to obesity.   Assessment/Plan:   1. Essential hypertension Regan is working on healthy weight loss and exercise to improve blood pressure control. We will watch for signs of hypotension as she continues her lifestyle modifications. Check labs today.  - Comprehensive metabolic panel  2. Vitamin D deficiency Low Vitamin D  level contributes to fatigue and are associated with obesity, breast, and colon cancer. She agrees to continue to take OTC multivitamins and will follow-up for routine testing of Vitamin D, at least 2-3 times per year to avoid over-replacement. Check labs today.  - VITAMIN D 25 Hydroxy (Vit-D Deficiency, Fractures)  3. Pre-diabetes Tiffany [name] will continue to work on weight loss, exercise, and decreasing simple carbohydrates to help decrease the risk of diabetes. Check labs today.  - Hemoglobin A1c - Insulin, random  4. Other fatigue Tiffany [name] does feel that her weight is causing her energy to be lower than it should be. Fatigue may be related to obesity, depression or many other causes. Labs will be ordered, and in the meanwhile, Tiffany [name] will focus on self care including making healthy food choices, increasing physical activity and focusing on stress reduction. Check labs today.  - CBC with Differential/Platelet  5. At risk for diabetes mellitus Tiffany [name] was given approximately 15 minutes of diabetes education and counseling today. We discussed intensive lifestyle modifications today with an emphasis on weight loss as well as increasing exercise and decreasing simple carbohydrates in her diet. We also reviewed medication options with an emphasis on risk versus benefit of those discussed.   Repetitive spaced learning was employed today to elicit superior memory formation and behavioral change.  6. Class 1 obesity with serious comorbidity and body mass index (BMI) of 31.0 to 31.9 in adult, unspecified obesity type Tiffany [name] is currently in the action stage of change. As such, her goal is to continue with  weight loss efforts. She has agreed to the Category 2 Plan.   Labs ordered via [place] [facility].  Exercise goals: No exercise has been prescribed at this time.  Behavioral modification strategies: increasing lean protein intake, decreasing simple carbohydrates, decreasing eating out, meal planning and  cooking strategies, better snacking choices and planning for success.  Tiffany [name] has agreed to follow-up with our clinic in 2 weeks. She was informed of the importance of frequent follow-up visits to maximize her success with intensive lifestyle modifications for her multiple health conditions.   Tiffany [name] was informed we would discuss her lab results at her next visit unless there is a critical issue that needs to be addressed sooner. Tiffany [name] agreed to keep her next visit at the agreed upon time to discuss these results.  Objective:   Blood pressure 125/70, pulse 73, temperature 98.2 F (36.8 C), height 4\' 11"  (1.499 m), weight 153 lb (69.4 kg), SpO2 98 %. Body mass index is 30.9 kg/m.  General: Cooperative, alert, well developed, in no acute distress. HEENT: Conjunctivae and lids unremarkable. Cardiovascular: Regular rhythm.  Lungs: Normal work of breathing. Neurologic: No focal deficits.   Lab Results  Component Value Date   CREATININE 0.82 06/20/2020   BUN 11 06/20/2020   NA 139 06/20/2020   K 4.7 06/20/2020   CL 102 06/20/2020   CO2 27 06/20/2020   Lab Results  Component Value Date   ALT 15 06/20/2020   AST 15 06/20/2020   ALKPHOS 104 01/04/2019   BILITOT 0.6 06/20/2020   Lab Results  Component Value Date   HGBA1C 5.3 06/20/2020   HGBA1C 5.5 10/20/2019   No results found for: INSULIN Lab Results  Component Value Date   TSH 1.49 10/20/2019   Lab Results  Component Value Date   CHOL 177 10/20/2019   HDL 60 10/20/2019   LDLCALC 97 10/20/2019   TRIG 106 10/20/2019   CHOLHDL 3.0 10/20/2019   Lab Results  Component Value Date   WBC 8.0 10/20/2019   HGB 13.0 10/20/2019   HCT 40.5 10/20/2019   MCV 81.0 10/20/2019   PLT 450 (H) 10/20/2019     Attestation Statements:   Reviewed by clinician on day of visit: allergies, medications, problem list, medical history, surgical history, family history, social history, and previous encounter notes.  Coral Ceo, am acting as Location manager for Mina Marble, NP.  I have reviewed the above documentation for accuracy and completeness, and I agree with the above. -  Tanika Bracco d. Shaqueta Casady, NP-C

## 2020-10-04 ENCOUNTER — Encounter (INDEPENDENT_AMBULATORY_CARE_PROVIDER_SITE_OTHER): Payer: Self-pay | Admitting: Adult Health

## 2020-10-08 ENCOUNTER — Other Ambulatory Visit (INDEPENDENT_AMBULATORY_CARE_PROVIDER_SITE_OTHER): Payer: Self-pay | Admitting: Adult Health

## 2020-10-08 MED ORDER — AMLODIPINE BESYLATE 2.5 MG PO TABS: 2.5000 mg | ORAL_TABLET | Freq: Every day | ORAL | 0 refills | Status: DC

## 2020-10-08 NOTE — Telephone Encounter (Signed)
For you -

## 2020-10-10 ENCOUNTER — Other Ambulatory Visit: Payer: Self-pay

## 2020-10-10 ENCOUNTER — Ambulatory Visit
Admission: RE | Admit: 2020-10-10 | Discharge: 2020-10-10 | Disposition: A | Discharge status: Home / Self Care | Payer: 59 | Source: Ambulatory Visit

## 2020-10-10 DIAGNOSIS — Z1231 Encounter for screening mammogram for malignant neoplasm of breast: Secondary | ICD-10-CM

## 2020-10-11 LAB — COMPREHENSIVE METABOLIC PANEL
AG Ratio: 2.1 (calc) (ref 1.0–2.5)
ALT: 13 U/L (ref 6–29)
AST: 14 U/L (ref 10–35)
Albumin: 4.6 g/dL (ref 3.6–5.1)
Alkaline phosphatase (APISO): 78 U/L (ref 37–153)
BUN: 9 mg/dL (ref 7–25)
CO2: 30 mmol/L (ref 20–32)
Calcium: 9.8 mg/dL (ref 8.6–10.4)
Chloride: 103 mmol/L (ref 98–110)
Creat: 0.91 mg/dL (ref 0.50–1.05)
Globulin: 2.2 g/dL (calc) (ref 1.9–3.7)
Glucose, Bld: 98 mg/dL (ref 65–99)
Potassium: 4.5 mmol/L (ref 3.5–5.3)
Sodium: 139 mmol/L (ref 135–146)
Total Bilirubin: 0.5 mg/dL (ref 0.2–1.2)
Total Protein: 6.8 g/dL (ref 6.1–8.1)

## 2020-10-11 LAB — CBC WITH DIFFERENTIAL/PLATELET
Absolute Monocytes: 515 cells/uL (ref 200–950)
Basophils Absolute: 31 cells/uL (ref 0–200)
Basophils Relative: 0.5 %
Eosinophils Absolute: 180 cells/uL (ref 15–500)
Eosinophils Relative: 2.9 %
HCT: 41.2 % (ref 35.0–45.0)
Hemoglobin: 12.8 g/dL (ref 11.7–15.5)
Lymphs Abs: 1947 cells/uL (ref 850–3900)
MCH: 25.5 pg — ABNORMAL LOW (ref 27.0–33.0)
MCHC: 31.1 g/dL — ABNORMAL LOW (ref 32.0–36.0)
MCV: 82.1 fL (ref 80.0–100.0)
MPV: 10 fL (ref 7.5–12.5)
Monocytes Relative: 8.3 %
Neutro Abs: 3528 cells/uL (ref 1500–7800)
Neutrophils Relative %: 56.9 %
Platelets: 391 10*3/uL (ref 140–400)
RBC: 5.02 10*6/uL (ref 3.80–5.10)
RDW: 12.7 % (ref 11.0–15.0)
Total Lymphocyte: 31.4 %
WBC: 6.2 10*3/uL (ref 3.8–10.8)

## 2020-10-11 LAB — HEMOGLOBIN A1C
Hgb A1c MFr Bld: 5.2 % of total Hgb (ref <5.7)
Mean Plasma Glucose: 103 mg/dL
eAG (mmol/L): 5.7 mmol/L

## 2020-10-11 LAB — VITAMIN D 25 HYDROXY (VIT D DEFICIENCY, FRACTURES): Vit D, 25-Hydroxy: 65 ng/mL (ref 30–100)

## 2020-10-11 LAB — INSULIN, RANDOM: Insulin: 7.9 u[IU]/mL

## 2020-10-15 ENCOUNTER — Other Ambulatory Visit (INDEPENDENT_AMBULATORY_CARE_PROVIDER_SITE_OTHER): Payer: Self-pay

## 2020-10-22 ENCOUNTER — Encounter (INDEPENDENT_AMBULATORY_CARE_PROVIDER_SITE_OTHER): Payer: Self-pay | Admitting: Adult Health

## 2020-10-22 ENCOUNTER — Other Ambulatory Visit: Payer: Self-pay

## 2020-10-22 ENCOUNTER — Ambulatory Visit (INDEPENDENT_AMBULATORY_CARE_PROVIDER_SITE_OTHER): Payer: 59 | Admitting: Adult Health

## 2020-10-22 VITALS — BP 96/63 | HR 75 | Temp 98.5°F | Ht 59.0 in | Wt 150.0 lb

## 2020-10-22 DIAGNOSIS — E559 Vitamin D deficiency, unspecified: Secondary | ICD-10-CM

## 2020-10-22 DIAGNOSIS — Z9189 Other specified personal risk factors, not elsewhere classified: Secondary | ICD-10-CM

## 2020-10-22 DIAGNOSIS — R7303 Prediabetes: Secondary | ICD-10-CM | POA: Diagnosis not present

## 2020-10-22 DIAGNOSIS — I10 Essential (primary) hypertension: Secondary | ICD-10-CM | POA: Diagnosis not present

## 2020-10-22 DIAGNOSIS — Z6836 Body mass index (BMI) 36.0-36.9, adult: Secondary | ICD-10-CM

## 2020-10-22 MED ORDER — AMLODIPINE BESYLATE 5 MG PO TABS: 5.0000 mg | ORAL_TABLET | Freq: Every day | ORAL | 0 refills | Status: DC

## 2020-10-22 NOTE — Progress Notes (Signed)
Chief Complaint:   OBESITY Tiffany [name] is here to discuss her progress with her obesity treatment plan along with follow-up of her obesity related diagnoses. Tiffany [name] is on the Category 2 Plan and states she is following her eating plan approximately 80% of the time. Tiffany [name] states she is walking 20 minutes 3 times per week.  Today's visit was #: 12 Starting weight: 180 lbs Starting date: 10/20/2019 Today's weight: 150 lbs Today's date: 10/22/2020 Total lbs lost to date: 30 Total lbs lost since last in-office visit: 3  Interim History: Tiffany [name] has been enjoying a salad with protein for dinner "to mix things up".  Current weight 150 with BMI 30. Her next goal is to lose another 3-5 lbs, which will lower BMI to less than 30.  Subjective:   1. Vitamin D deficiency Discussed labs with patient today. Epsie's Vitamin D level was at goal 65 on 10/10/2020. She is currently taking OTC multivitamin. She denies nausea, vomiting or muscle weakness.  2. Essential hypertension Discussed labs with patient today. BP soft at OV. Tiffany [name] is on amlodipine 5+2.5 mg = 7.5 mg QD and metoprolol tartrate 50 mg BID.  10/10/2020 CMP- stable.  BP Readings from Last 3 Encounters:  10/22/20 96/63  10/01/20 125/70  08/28/20 123/66   3. Pre-diabetes Discussed labs with patient today. 10/10/2020 A1c 5.2, BG 98, and insulin level 7.9.  Tiffany [name]'s A1c has been at goal the last several checks.  She is on Metformin 500 mg QD-takes it at lunch.  Lab Results  Component Value Date   HGBA1C 5.2 10/10/2020   No results found for: INSULIN  4. At risk for heart disease Tiffany [name] is at a higher than average risk for cardiovascular disease due to obesity.   Assessment/Plan:   1. Vitamin D deficiency Low Vitamin D level contributes to fatigue and are associated with obesity, breast, and colon cancer. She agrees to continue to take OTC multivitamin daily and will follow-up for routine testing of Vitamin D, at least 2-3  times per year to avoid over-replacement. -no other Vit D supplements at this time  2. Essential hypertension Tiffany [name] is working on healthy weight loss and exercise to improve blood pressure control. We will watch for signs of hypotension as she continues her lifestyle modifications. -Monitor ambulatory BP and bring log to next OV. - amLODipine (NORVASC) 5 MG tablet; Take 1 tablet (5 mg total) by mouth daily.  Dispense: 90 tablet; Refill: 0 This is reduction from Amlodipine 7.5mg  QD  3. Pre-diabetes Tiffany [name] will continue to work on weight loss, exercise, and decreasing simple carbohydrates to help decrease the risk of diabetes.  -Continue Metformin, category 2 meal plan, and regular walking.  4. At risk for heart disease Tiffany [name] was given approximately 15 minutes of coronary artery disease prevention counseling today. She is 55 y.o. female and has risk factors for heart disease including obesity. We discussed intensive lifestyle modifications today with an emphasis on specific weight loss instructions and strategies.   Repetitive spaced learning was employed today to elicit superior memory formation and behavioral change.  5. Obesity with current BMI 30.4  Tiffany [name] is currently in the action stage of change. As such, her goal is to continue with weight loss efforts. She has agreed to the Category 2 Plan.   Exercise goals: As is  Behavioral modification strategies: increasing lean protein intake, meal planning and cooking strategies and planning for success.  Tiffany [name] has agreed to follow-up with our clinic in 3 weeks. She was  informed of the importance of frequent follow-up visits to maximize her success with intensive lifestyle modifications for her multiple health conditions.   Objective:   Blood pressure 96/63, pulse 75, temperature 98.5 F (36.9 C), height 4\' 11"  (1.499 m), weight 150 lb (68 kg), SpO2 98 %. Body mass index is 30.3 kg/m.  General: Cooperative, alert, well  developed, in no acute distress. HEENT: Conjunctivae and lids unremarkable. Cardiovascular: Regular rhythm.  Lungs: Normal work of breathing. Neurologic: No focal deficits.   Lab Results  Component Value Date   CREATININE 0.91 10/10/2020   BUN 9 10/10/2020   NA 139 10/10/2020   K 4.5 10/10/2020   CL 103 10/10/2020   CO2 30 10/10/2020   Lab Results  Component Value Date   ALT 13 10/10/2020   AST 14 10/10/2020   ALKPHOS 104 01/04/2019   BILITOT 0.5 10/10/2020   Lab Results  Component Value Date   HGBA1C 5.2 10/10/2020   HGBA1C 5.3 06/20/2020   HGBA1C 5.5 10/20/2019   No results found for: INSULIN Lab Results  Component Value Date   TSH 1.49 10/20/2019   Lab Results  Component Value Date   CHOL 177 10/20/2019   HDL 60 10/20/2019   LDLCALC 97 10/20/2019   TRIG 106 10/20/2019   CHOLHDL 3.0 10/20/2019   Lab Results  Component Value Date   WBC 6.2 10/10/2020   HGB 12.8 10/10/2020   HCT 41.2 10/10/2020   MCV 82.1 10/10/2020   PLT 391 10/10/2020    Attestation Statements:   Reviewed by clinician on day of visit: allergies, medications, problem list, medical history, surgical history, family history, social history, and previous encounter notes.  Coral Ceo, CMA, am acting as transcriptionist for Mina Marble, NP.  I have reviewed the above documentation for accuracy and completeness, and I agree with the above. -  Jerold Yoss d. Christyn Gutkowski, NP-C

## 2020-11-20 ENCOUNTER — Encounter (INDEPENDENT_AMBULATORY_CARE_PROVIDER_SITE_OTHER): Payer: Self-pay | Admitting: Adult Health

## 2020-11-20 ENCOUNTER — Ambulatory Visit (INDEPENDENT_AMBULATORY_CARE_PROVIDER_SITE_OTHER): Payer: 59 | Admitting: Adult Health

## 2020-11-20 ENCOUNTER — Other Ambulatory Visit: Payer: Self-pay

## 2020-11-20 VITALS — BP 108/72 | HR 71 | Temp 98.1°F | Ht 59.0 in | Wt 147.0 lb

## 2020-11-20 DIAGNOSIS — R7303 Prediabetes: Secondary | ICD-10-CM

## 2020-11-20 DIAGNOSIS — I10 Essential (primary) hypertension: Secondary | ICD-10-CM

## 2020-11-20 DIAGNOSIS — Z6836 Body mass index (BMI) 36.0-36.9, adult: Secondary | ICD-10-CM | POA: Diagnosis not present

## 2020-11-21 NOTE — Progress Notes (Signed)
Chief Complaint:   OBESITY Tiffany [name] is here to discuss her progress with her obesity treatment plan along with follow-up of her obesity related diagnoses. Tiffany [name] is on the Category 2 Plan and states she is following her eating plan approximately 85-90% of the time. Tiffany [name] states she has not been exercising.  Today's visit was #: 20 Starting weight: 180 lbs Starting date: 10/20/2019 Today's weight: 147 lbs Today's date: 11/20/2020 Total lbs lost to date: 33 lbs Total lbs lost since last in-office visit: 3 lbs  Interim History: Tiffany [name] achieved her last interval goal of losing 3-5 lbs.   Current weight is 147 lb with a BMI of 29.8!  Tiffany [name]'s next interval goal is to incorporate activity/exercises into daily routine.   Subjective:   1. Essential hypertension Review: taking medications as instructed, no medication side effects noted, no chest pain on exertion, no dyspnea on exertion, no swelling of ankles. BP/HR are stable. Ambulatory readings: SBP - 110/130 and DBP - 70-80.  She is taking Amlodipine 5 mg QD -been slowly decreasing CCB for the last several months due to BP lowering from weight loss.  BP Readings from Last 3 Encounters:  11/20/20 108/72  10/22/20 96/63  10/01/20 125/70   2. Prediabetes Tiffany [name] has a diagnosis of prediabetes based on her elevated HgA1c and was informed this puts her at greater risk of developing diabetes. She continues to work on diet and exercise to decrease her risk of diabetes. She denies nausea or hypoglycemia.  The following was discussed with the patient today: A1C  = 5.2 with Insulin level of 7.9. She is on Metformin 500 mg QD and takes it at lunch time.  Lab Results  Component Value Date   HGBA1C 5.2 10/10/2020   No results found for: INSULIN   Assessment/Plan:   1. Essential hypertension Tiffany [name] is working on healthy weight loss and exercise to improve blood pressure control. We will watch for signs of hypotension as she continues  her lifestyle modifications. Tiffany [name] will continue Amlodipine 5 mg QD.  2. Prediabetes Tiffany [name] will continue to work on weight loss, exercise, and decreasing simple carbohydrates to help decrease the risk of diabetes.  Tiffany [name] will continue Metformin 500 mg QD.  3. Obesity with current BMI 29.8 Tiffany [name] is currently in the action stage of change. As such, her goal is to continue with weight loss efforts. She has agreed to the Category 2 Plan and keeping a food journal and adhering to recommended goals of 200-300 calories and 20 grams of protein at breakfast.   Exercise goals: For substantial health benefits, adults should do at least 150 minutes (2 hours and 30 minutes) a week of moderate-intensity, or 75 minutes (1 hour and 15 minutes) a week of vigorous-intensity aerobic physical activity, or an equivalent combination of moderate- and vigorous-intensity aerobic activity. Aerobic activity should be performed in episodes of at least 10 minutes, and preferably, it should be spread throughout the week.  Behavioral modification strategies: increasing lean protein intake, decreasing simple carbohydrates, meal planning and cooking strategies, keeping healthy foods in the home, and planning for success.  Tiffany [name] has agreed to follow-up with our clinic in 3 weeks. She was informed of the importance of frequent follow-up visits to maximize her success with intensive lifestyle modifications for her multiple health conditions.   Objective:   Blood pressure 108/72, pulse 71, temperature 98.1 F (36.7 C), height 4\' 11"  (1.499 m), weight 147 lb (66.7 kg), SpO2 98 %. Body mass index is 29.69  kg/m.  General: Cooperative, alert, well developed, in no acute distress. HEENT: Conjunctivae and lids unremarkable. Cardiovascular: Regular rhythm.  Lungs: Normal work of breathing. Neurologic: No focal deficits.   Lab Results  Component Value Date   CREATININE 0.91 10/10/2020   BUN 9 10/10/2020   NA 139  10/10/2020   K 4.5 10/10/2020   CL 103 10/10/2020   CO2 30 10/10/2020   Lab Results  Component Value Date   ALT 13 10/10/2020   AST 14 10/10/2020   ALKPHOS 104 01/04/2019   BILITOT 0.5 10/10/2020   Lab Results  Component Value Date   HGBA1C 5.2 10/10/2020   HGBA1C 5.3 06/20/2020   HGBA1C 5.5 10/20/2019   No results found for: INSULIN Lab Results  Component Value Date   TSH 1.49 10/20/2019   Lab Results  Component Value Date   CHOL 177 10/20/2019   HDL 60 10/20/2019   LDLCALC 97 10/20/2019   TRIG 106 10/20/2019   CHOLHDL 3.0 10/20/2019   Lab Results  Component Value Date   WBC 6.2 10/10/2020   HGB 12.8 10/10/2020   HCT 41.2 10/10/2020   MCV 82.1 10/10/2020   PLT 391 10/10/2020   No results found for: IRON, TIBC, FERRITIN  Attestation Statements:   Reviewed by clinician on day of visit: allergies, medications, problem list, medical history, surgical history, family history, social history, and previous encounter notes.  Time spent on visit including pre-visit chart review and post-visit care and charting was 32 minutes.   ILennette Bihari, CMA, am acting as Location manager for [employer]. Brittay Mogle, NP.  I have reviewed the above documentation for accuracy and completeness, and I agree with the above. -  Keah Lamba d. Kenyen Candy, NP-C

## 2020-12-13 ENCOUNTER — Other Ambulatory Visit: Payer: Self-pay

## 2020-12-13 ENCOUNTER — Encounter (INDEPENDENT_AMBULATORY_CARE_PROVIDER_SITE_OTHER): Payer: Self-pay | Admitting: Family Medicine

## 2020-12-13 ENCOUNTER — Ambulatory Visit (INDEPENDENT_AMBULATORY_CARE_PROVIDER_SITE_OTHER): Payer: 59 | Admitting: Family Medicine

## 2020-12-13 VITALS — BP 118/73 | HR 72 | Temp 98.2°F | Ht 59.0 in | Wt 150.0 lb

## 2020-12-13 DIAGNOSIS — R7303 Prediabetes: Secondary | ICD-10-CM

## 2020-12-13 DIAGNOSIS — Z9189 Other specified personal risk factors, not elsewhere classified: Secondary | ICD-10-CM

## 2020-12-13 DIAGNOSIS — Z6836 Body mass index (BMI) 36.0-36.9, adult: Secondary | ICD-10-CM

## 2020-12-13 DIAGNOSIS — I10 Essential (primary) hypertension: Secondary | ICD-10-CM

## 2020-12-16 MED ORDER — METFORMIN HCL 500 MG PO TABS: ORAL_TABLET | ORAL | 0 refills | Status: AC

## 2020-12-26 NOTE — Progress Notes (Signed)
0    Chief Complaint:   OBESITY Tiffany [name] is here to discuss her progress with her obesity treatment plan along with follow-up of her obesity related diagnoses.   Today's visit was #: 21 Starting weight: 180 lbs Starting date: 10/20/2019 Today's weight: 150 lbs Today's date: 12/13/2020 Weight change since last visit: +3 lbs Total lbs lost to date: 30 lbs Body mass index is 30.3 kg/m.  Total weight loss percentage to date: -16.67%  Interim History:  [name] says she ate and enjoyed over the holidays this past weekend.  She is not surprised that she gained.  She is not worried and knows the plan well.  No concerns today.  Current Meal Plan: the Category 2 Plan and keeping a food journal and adhering to recommended goals of 200-300 calories and 20 grams of protein at breakfast for 80-85% of the time.  Current Exercise Plan: Walking for 1-1.25 mile 2 times per week.  Assessment/Plan:   Medications Discontinued During This Encounter  Medication Reason   metFORMIN (GLUCOPHAGE) 500 MG tablet Reorder   Meds ordered this encounter  Medications   metFORMIN (GLUCOPHAGE) 500 MG tablet    Sig: Daily with lunch    Dispense:  90 tablet    Refill:  0   1. Pre-diabetes At goal. Goal is HgbA1c < 5.7.  Medication: metformin 500 mg daily.  Tolerating well, without concerns.   Plan:  She will continue to focus on protein-rich, low simple carbohydrate foods. We reviewed the importance of hydration, regular exercise for stress reduction, and restorative sleep.   Lab Results  Component Value Date   HGBA1C 5.2 10/10/2020   - Refill metFORMIN (GLUCOPHAGE) 500 MG tablet; Daily with lunch  Dispense: 90 tablet; Refill: 0  2. Hypertension, unspecified type At goal. Medications: Norvasc 5 mg daily, metoprolol 50 mg daily.  Denies dizziness/lightheadedness.  Plan:  At goal.  Continue medications.  Avoid buying foods that are: processed, frozen, or prepackaged to avoid excess salt. We will watch for  signs of hypotension as she continues lifestyle modifications. We will continue to monitor closely alongside her PCP and/or Specialist.  Regular follow up with PCP and specialists was also encouraged.   BP Readings from Last 3 Encounters:  12/13/20 118/73  11/20/20 108/72  10/22/20 96/63   Lab Results  Component Value Date   CREATININE 0.91 10/10/2020   3. At risk for complication associated with hypotension Kendle was given approximately 9 minutes of education and counseling today regarding the condition of hypotension, the pathophysiology of the condition and concerns regarding prevention and treatment of this condition.  We discussed risks of medications used to treat hypertension, which in the setting of weight loss, can inadvertently cause hypotension.  Signs of hypotension such as feeling lightheaded or unsteady, esp when getting up first thing in the AM or off the toilet, were reviewed in detail and all questions were answered.  The use of motivational interviewing was employed as a technique as well today to aid in the treatment of patient's multiple conditions.  Pt understands importance of proper hydration and also of more prudent home BP monitoring while actively losing weight.  she will call us, or their PCP,  or other specialists who treat their BP with medications, with any questions or concerns that may develop.    4. Class 2 severe obesity with serious comorbidity and body mass index (BMI) of 36.0 to 36.9 in adult, unspecified obesity type [facility])  Course: Tiffany [name] is currently in the action stage  of change. As such, her goal is to continue with weight loss efforts.   Nutrition goals: She has agreed to the Category 2 Plan and keeping a food journal and adhering to recommended goals of 200-300 calories and 20 grams of protein at breakfast.   Exercise goals:  Increase to 20 minutes 3 days per week.  Behavioral modification strategies: avoiding temptations and planning for  success.  Axie has agreed to follow-up with our clinic in 3-4 weeks. She was informed of the importance of frequent follow-up visits to maximize her success with intensive lifestyle modifications for her multiple health conditions.   Objective:   Blood pressure 118/73, pulse 72, temperature 98.2 F (36.8 C), height 4\' 11"  (1.499 m), weight 150 lb (68 kg), SpO2 100 %. Body mass index is 30.3 kg/m.  General: Cooperative, alert, well developed, in no acute distress. HEENT: Conjunctivae and lids unremarkable. Cardiovascular: Regular rhythm.  Lungs: Normal work of breathing. Neurologic: No focal deficits.   Lab Results  Component Value Date   CREATININE 0.91 10/10/2020   BUN 9 10/10/2020   NA 139 10/10/2020   K 4.5 10/10/2020   CL 103 10/10/2020   CO2 30 10/10/2020   Lab Results  Component Value Date   ALT 13 10/10/2020   AST 14 10/10/2020   ALKPHOS 104 01/04/2019   BILITOT 0.5 10/10/2020   Lab Results  Component Value Date   HGBA1C 5.2 10/10/2020   HGBA1C 5.3 06/20/2020   HGBA1C 5.5 10/20/2019   Lab Results  Component Value Date   TSH 1.49 10/20/2019   Lab Results  Component Value Date   CHOL 177 10/20/2019   HDL 60 10/20/2019   LDLCALC 97 10/20/2019   TRIG 106 10/20/2019   CHOLHDL 3.0 10/20/2019   Lab Results  Component Value Date   VD25OH 65 10/10/2020   VD25OH 75 06/20/2020   VD25OH 45 10/20/2019   Lab Results  Component Value Date   WBC 6.2 10/10/2020   HGB 12.8 10/10/2020   HCT 41.2 10/10/2020   MCV 82.1 10/10/2020   PLT 391 10/10/2020   Attestation Statements:   Reviewed by clinician on day of visit: allergies, medications, problem list, medical history, surgical history, family history, social history, and previous encounter notes.  I, Water quality scientist, CMA, am acting as Location manager for [employer], DO.  I have reviewed the above documentation for accuracy and completeness, and I agree with the above. Marjory Sneddon, D.O.  The  [place] was signed into law in 2016 which includes the topic of electronic health records.  This provides immediate access to information in MyChart.  This includes consultation notes, operative notes, office notes, lab results and pathology reports.  If you have any questions about what you read please let us know at your next visit so we can discuss your concerns and take corrective action if need be.  We are right here with you.

## 2021-01-15 ENCOUNTER — Ambulatory Visit (INDEPENDENT_AMBULATORY_CARE_PROVIDER_SITE_OTHER): Payer: 59 | Admitting: Adult Health

## 2021-01-15 ENCOUNTER — Encounter (INDEPENDENT_AMBULATORY_CARE_PROVIDER_SITE_OTHER): Payer: Self-pay

## 2021-01-23 ENCOUNTER — Ambulatory Visit (INDEPENDENT_AMBULATORY_CARE_PROVIDER_SITE_OTHER): Payer: 59 | Admitting: Physician Assistant

## 2021-01-29 ENCOUNTER — Other Ambulatory Visit (INDEPENDENT_AMBULATORY_CARE_PROVIDER_SITE_OTHER): Payer: Self-pay | Admitting: Adult Health

## 2021-01-29 DIAGNOSIS — I10 Essential (primary) hypertension: Secondary | ICD-10-CM

## 2021-02-19 ENCOUNTER — Encounter: Payer: 59 | Admitting: Family

## 2021-02-21 ENCOUNTER — Emergency Department (HOSPITAL_BASED_OUTPATIENT_CLINIC_OR_DEPARTMENT_OTHER)
Admission: EM | Admit: 2021-02-21 | Discharge: 2021-02-21 | Disposition: A | Discharge status: Home / Self Care | Payer: 59 | Attending: Emergency Medicine | Admitting: Emergency Medicine

## 2021-02-21 ENCOUNTER — Encounter (HOSPITAL_BASED_OUTPATIENT_CLINIC_OR_DEPARTMENT_OTHER): Payer: Self-pay | Admitting: *Deleted

## 2021-02-21 ENCOUNTER — Other Ambulatory Visit: Payer: Self-pay

## 2021-02-21 DIAGNOSIS — R7303 Prediabetes: Secondary | ICD-10-CM | POA: Diagnosis not present

## 2021-02-21 DIAGNOSIS — Z7984 Long term (current) use of oral hypoglycemic drugs: Secondary | ICD-10-CM | POA: Diagnosis not present

## 2021-02-21 DIAGNOSIS — I10 Essential (primary) hypertension: Secondary | ICD-10-CM | POA: Diagnosis not present

## 2021-02-21 DIAGNOSIS — R220 Localized swelling, mass and lump, head: Secondary | ICD-10-CM | POA: Diagnosis present

## 2021-02-21 DIAGNOSIS — Z79899 Other long term (current) drug therapy: Secondary | ICD-10-CM | POA: Diagnosis not present

## 2021-02-21 MED ORDER — VALACYCLOVIR HCL 1 G PO TABS: 1000.0000 mg | ORAL_TABLET | Freq: Three times a day (TID) | ORAL | 0 refills | Status: DC

## 2021-02-21 MED ORDER — CEPHALEXIN 500 MG PO CAPS: 500.0000 mg | ORAL_CAPSULE | Freq: Four times a day (QID) | ORAL | 0 refills | Status: DC

## 2021-02-21 MED ORDER — CEPHALEXIN 250 MG PO CAPS: 500.0000 mg | ORAL_CAPSULE | Freq: Once | ORAL | Status: DC

## 2021-02-21 NOTE — ED Triage Notes (Signed)
Headache on and off x 2 weeks. She scratched her head tonight and felt a knot on the right of her scalp.

## 2021-02-21 NOTE — ED Provider Notes (Signed)
[facility] EMERGENCY DEPARTMENT Provider Note   CSN: [id number] Arrival date & time: 02/21/21  2102     History Chief Complaint  Patient presents with   Knot on her scalp    Tiffany [name] is a 55 y.o. female.  The history is provided by the patient and medical records.  Tiffany [name] is a 55 y.o. female who presents to the Emergency Department complaining of scalp swelling.  She presents to the ED complaining of swelling to the right side of her scalp that started three hours prior to arrival. She states that she lightly scratched the area and shortly thereafter it began to swell and she feels some mild local throbbing. She does report having intermittent headaches for the last week. Her headache is located across her forehead and the top of her head. She does not currently have a headache. She states that area feels warm. No trauma to the area. No prior similar symptoms. No new hair products.  No fever, nausea, vomiting, chest pain, abdominal pain, numbness, weakness, vision changes. No night sweats. She has been in a weight loss program, no unintentional weight loss.  She has a history of hypertension, epilepsy. She does have a remote history of non-Hodgkin's lymphoma from 2008. She has been cleared from oncology regarding her lymphoma.      Past Medical History:  Diagnosis Date   Dyspnea    Epilepsy ([place])    Hypertension    Microscopic hematuria 09/25/2016   Had complete work up with Dr. Wendy Poet.   Cysto unrevealing,  CT scan nl    Non Hodgkin's lymphoma ([place]) 2/08   stage III   Obesity    Palpitations    Seizure disorder ([place])    last Seizure 10 years ago around 2012 per pt   Shortness of breath 07/05/2019    Patient Active Problem List   Diagnosis Date Noted   Other fatigue 10/01/2020   Insulin resistance 05/30/2020   Visual disturbance 04/26/2020   Pre-diabetes 11/21/2019   Class 1 obesity with serious comorbidity and body mass index  (BMI) of 30.0 to 30.9 in adult 11/21/2019   Class 2 severe obesity with serious comorbidity and body mass index (BMI) of 36.0 to 36.9 in adult ([place]) 11/03/2019   SOB (shortness of breath) on exertion 07/05/2019   Abnormal nuclear stress test 04/26/2019   Chest discomfort 04/04/2019   Palpitations 04/04/2019   Microscopic hematuria 09/25/2016   History of neck swelling 12/04/2014   Vitamin D deficiency 10/27/2014   GERD (gastroesophageal reflux disease) 10/25/2014   Sciatica of right side 10/25/2014   Constipation 10/25/2014   Insomnia 04/06/2013   Seizure disorder, 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/27/2013   Essential hypertension 06/22/2012   Non Hodgkin's lymphoma ([place]) 11/12/2011   Family history of breast cancer 11/12/2011   History of seizure disorder 11/12/2011   Hematuria 02/13/2011    Past Surgical History:  Procedure Laterality Date   [place], 2002   [facility] REMOVAL  2009   PORTACATH PLACEMENT  2008     OB History     Gravida  2   Para  2   Term      Preterm      AB      Living  2      SAB      IAB      Ectopic      Multiple      Live Births  Family History  Problem Relation Age of Onset   Breast cancer Mother    Hypertension Mother    COPD Mother    Diabetes Mother    Hepatitis C Father    Diabetes Father    Hypertension Father    Hyperlipidemia Father    Heart disease Father    Stroke Father    Liver disease Father    Alcoholism Father    Colon cancer Father 20   Hypertension Maternal Grandmother    Hypertension Paternal Grandmother    Colon polyps Neg Hx    Esophageal cancer Neg Hx    Stomach cancer Neg Hx    Rectal cancer Neg Hx     Social History   Tobacco Use   Smoking status: Never   Smokeless tobacco: Never  Vaping Use   Vaping Use: Never used  Substance Use Topics   Alcohol use: No   Drug use: No    Home Medications Prior to Admission medications   Medication Sig Start Date End  Date Taking? Authorizing Provider  amLODipine (NORVASC) 5 MG tablet Take 1 tablet (5 mg total) by mouth daily. 10/22/20  Yes Danford, Valetta Fuller D, NP  budesonide-formoterol (SYMBICORT) 160-4.5 MCG/ACT inhaler Inhale 2 puffs into the lungs 2 (two) times daily. 05/21/20  Yes Noemi Chapel P, DO  cephALEXin (KEFLEX) 500 MG capsule Take 1 capsule (500 mg total) by mouth 4 (four) times daily. 02/21/21  Yes Quintella Reichert, MD  lamoTRIgine (LAMICTAL) 100 MG tablet Take 1 tablet (100 mg total) by mouth 2 (two) times daily. 08/21/20  Yes Frann Rider, NP  metFORMIN (GLUCOPHAGE) 500 MG tablet Daily with lunch 12/16/20  Yes Opalski, Deborah, DO  metoprolol tartrate (LOPRESSOR) 50 MG tablet TAKE 1 TABLET BY MOUTH TWICE A DAY 06/25/20  Yes Skeet Latch, MD  Misc Natural Products (DAILY HERBS IMMUNE DEFENSE PO) Take 2 capsules by mouth daily.   Yes [provider]  Multiple Vitamins-Minerals (EMERGEN-C IMMUNE PLUS) PACK Take 1 packet by mouth daily.   Yes [provider]  Multiple Vitamins-Minerals (MULTIVITAMIN WITH MINERALS) tablet Take 1 tablet by mouth daily.   Yes [provider]  valACYclovir (VALTREX) 1000 MG tablet Take 1 tablet (1,000 mg total) by mouth 3 (three) times daily. 02/21/21  Yes Quintella Reichert, MD  albuterol (VENTOLIN HFA) 108 (90 Base) MCG/ACT inhaler Inhale 2 puffs into the lungs every 4 (four) hours as needed for wheezing or shortness of breath. 02/24/20   Julian Hy, DO    Allergies    Patient has no known allergies.  Review of Systems   Review of Systems  All other systems reviewed and are negative.  Physical Exam Updated Vital Signs BP (!) 141/78 (BP Location: Right Arm)   Pulse 80   Temp 98.5 F (36.9 C) (Oral)   Resp 18   Ht '4\' 11"'$  (1.499 m)   Wt 68 kg   SpO2 96%   BMI 30.28 kg/m   Physical Exam Vitals and nursing note reviewed.  Constitutional:      Appearance: She is well-developed.  HENT:     Head: Normocephalic and atraumatic.      Comments: There is mild soft tissue swelling to the right parietal scalp. The area is warm but not read. There is no overlying rash or vesicular lesion. Cardiovascular:     Rate and Rhythm: Normal rate and regular rhythm.     Heart sounds: No murmur heard. Pulmonary:     Effort: Pulmonary effort is normal.  No respiratory distress.     Breath sounds: Normal breath sounds.  Abdominal:     Palpations: Abdomen is soft.     Tenderness: There is no abdominal tenderness. There is no guarding or rebound.  Musculoskeletal:        General: No tenderness.     Cervical back: Neck supple.  Lymphadenopathy:     Cervical: No cervical adenopathy.  Skin:    General: Skin is warm and dry.  Neurological:     Mental Status: She is alert and oriented to person, place, and time.     Comments: No asymmetry of facial movements. Moves all extremities symmetrically.  Psychiatric:        Behavior: Behavior normal.    ED Results / Procedures / Treatments   Labs (all labs ordered are listed, but only abnormal results are displayed) Labs Reviewed - No data to display  EKG None  Radiology No results found.  Procedures Procedures   Medications Ordered in ED Medications  cephALEXin (KEFLEX) capsule 500 mg (has no administration in time range)    ED Course  I have reviewed the triage vital signs and the nursing notes.  Pertinent labs & imaging results that were available during my care of the patient were reviewed by me and considered in my medical decision making (see chart for details).    MDM Rules/Calculators/A&P                          patient here for evaluation of swelling to the right side of her head. She does have mild soft tissue swelling in the area. No focal neurologic deficits or systemic symptoms. Unclear source of the swelling, it may be related to local irritation from recent scratching versus early cellulitis. No evidence of abscess. No findings of zoster on examination.  Presentation is not consistent with malignancy. Will start antibiotics for possible early cellulitis. Will also provide a prescription for Valtrex if she were to develop a with vesicular rash. Discussed importance of PCP follow-up as well as return precautions.  Final Clinical Impression(s) / ED Diagnoses Final diagnoses:  Superficial swelling of scalp    Rx / DC Orders ED Discharge Orders          Ordered    cephALEXin (KEFLEX) 500 MG capsule  4 times daily        02/21/21 2313    valACYclovir (VALTREX) 1000 MG tablet  3 times daily        02/21/21 2313             Quintella Reichert, MD 02/21/21 2321

## 2021-02-24 ENCOUNTER — Other Ambulatory Visit: Payer: Self-pay | Admitting: Critical Care Medicine

## 2021-02-26 ENCOUNTER — Other Ambulatory Visit: Payer: Self-pay | Admitting: Adult Health

## 2021-02-26 NOTE — Telephone Encounter (Signed)
Pharmacy requesting to switch Levalbuterol to Levobuterol. Is this ok?  Tammy please advise  Thanks

## 2021-02-27 ENCOUNTER — Emergency Department (HOSPITAL_BASED_OUTPATIENT_CLINIC_OR_DEPARTMENT_OTHER): Payer: 59

## 2021-02-27 ENCOUNTER — Encounter (HOSPITAL_BASED_OUTPATIENT_CLINIC_OR_DEPARTMENT_OTHER): Payer: Self-pay

## 2021-02-27 ENCOUNTER — Other Ambulatory Visit: Payer: Self-pay

## 2021-02-27 ENCOUNTER — Emergency Department (HOSPITAL_BASED_OUTPATIENT_CLINIC_OR_DEPARTMENT_OTHER)
Admission: EM | Admit: 2021-02-27 | Discharge: 2021-02-27 | Disposition: A | Discharge status: Home / Self Care | Payer: 59 | Attending: Emergency Medicine | Admitting: Emergency Medicine

## 2021-02-27 DIAGNOSIS — R220 Localized swelling, mass and lump, head: Secondary | ICD-10-CM | POA: Diagnosis present

## 2021-02-27 DIAGNOSIS — Z79899 Other long term (current) drug therapy: Secondary | ICD-10-CM | POA: Diagnosis not present

## 2021-02-27 DIAGNOSIS — I10 Essential (primary) hypertension: Secondary | ICD-10-CM | POA: Insufficient documentation

## 2021-02-27 NOTE — Discharge Instructions (Signed)
If you start having any visual symptoms, hearing changes or start having drooping of your face you should return for further evaluation.

## 2021-02-27 NOTE — ED Notes (Signed)
States she has been on antibiotic for 3 days without missing any doses with no improvement noticed.   Swelling and pressure on right side of head and face rated at 5/10.

## 2021-02-27 NOTE — ED Provider Notes (Signed)
[facility] Provider Note   CSN: [id number] Arrival date & time: 02/27/21  1909     History Chief Complaint  Patient presents with   Follow-up    Tiffany [name] is a 55 y.o. female.  Patient is a 56 year old female with a history of non-Hodgkin lymphoma, seizure disorder, hypertension, vitamin deficiency who is presenting today for recheck.  Patient was seen 6 days ago due to swelling on the right side of her scalp.  At that time they started antibiotics due to concern for possible early infection.  Patient reports however the swelling seems to be worsening.  It is moving further back on her scalp and even involving a little bit of her face.  To dull aching type of pain associated with the swelling.  It does not radiate.  She denies any visual changes, neck pain, dental pain.  No recent trauma or procedures have been done.  She started taking antibiotics 3 days ago and has not missed any doses but has not noticed it is made any difference.  She denies any chest pain or shortness of breath.  It is not worse in the morning when she wakes up for dependent on position.  No hearing changes.  She has not noticed any rashes of her scalp or had any thing done to her hair prior to the symptoms starting.  The history is provided by the patient.      Past Medical History:  Diagnosis Date   Dyspnea    Epilepsy ([place])    Hypertension    Microscopic hematuria 09/25/2016   Had complete work up with Dr. Wendy Poet.   Cysto unrevealing,  CT scan nl    Non Hodgkin's lymphoma ([place]) 2/08   stage III   Obesity    Palpitations    Seizure disorder ([place])    last Seizure 10 years ago around 2012 per pt   Shortness of breath 07/05/2019    Patient Active Problem List   Diagnosis Date Noted   Other fatigue 10/01/2020   Insulin resistance 05/30/2020   Visual disturbance 04/26/2020   Pre-diabetes 11/21/2019   Class 1 obesity with serious comorbidity and body mass index  (BMI) of 30.0 to 30.9 in adult 11/21/2019   Class 2 severe obesity with serious comorbidity and body mass index (BMI) of 36.0 to 36.9 in adult ([place]) 11/03/2019   SOB (shortness of breath) on exertion 07/05/2019   Abnormal nuclear stress test 04/26/2019   Chest discomfort 04/04/2019   Palpitations 04/04/2019   Microscopic hematuria 09/25/2016   History of neck swelling 12/04/2014   Vitamin D deficiency 10/27/2014   GERD (gastroesophageal reflux disease) 10/25/2014   Sciatica of right side 10/25/2014   Constipation 10/25/2014   Insomnia 04/06/2013   Seizure disorder, 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[place]) 01/27/2013   Essential hypertension 06/22/2012   Non Hodgkin's lymphoma ([place]) 11/12/2011   Family history of breast cancer 11/12/2011   History of seizure disorder 11/12/2011   Hematuria 02/13/2011    Past Surgical History:  Procedure Laterality Date   [place], 2002   [facility] REMOVAL  2009   PORTACATH PLACEMENT  2008     OB History     Gravida  2   Para  2   Term      Preterm      AB      Living  2      SAB      IAB      Ectopic  Multiple      Live Births              Family History  Problem Relation Age of Onset   Breast cancer Mother    Hypertension Mother    COPD Mother    Diabetes Mother    Hepatitis C Father    Diabetes Father    Hypertension Father    Hyperlipidemia Father    Heart disease Father    Stroke Father    Liver disease Father    Alcoholism Father    Colon cancer Father 43   Hypertension Maternal Grandmother    Hypertension Paternal Grandmother    Colon polyps Neg Hx    Esophageal cancer Neg Hx    Stomach cancer Neg Hx    Rectal cancer Neg Hx     Social History   Tobacco Use   Smoking status: Never   Smokeless tobacco: Never  Vaping Use   Vaping Use: Never used  Substance Use Topics   Alcohol use: No   Drug use: No    Home Medications Prior to Admission medications   Medication Sig Start Date End  Date Taking? Authorizing Provider  albuterol (VENTOLIN HFA) 108 (90 Base) MCG/ACT inhaler INHALE 2 PUFFS INTO THE LUNGS EVERY 4 HOURS AS NEEDED FOR WHEEZE OR FOR SHORTNESS OF BREATH 02/26/21   Parrett, Tammy S, NP  amLODipine (NORVASC) 5 MG tablet Take 1 tablet (5 mg total) by mouth daily. 10/22/20   Danford, Valetta Fuller D, NP  budesonide-formoterol (SYMBICORT) 160-4.5 MCG/ACT inhaler Inhale 2 puffs into the lungs 2 (two) times daily. 05/21/20   Julian Hy, DO  cephALEXin (KEFLEX) 500 MG capsule Take 1 capsule (500 mg total) by mouth 4 (four) times daily. 02/21/21   Quintella Reichert, MD  lamoTRIgine (LAMICTAL) 100 MG tablet Take 1 tablet (100 mg total) by mouth 2 (two) times daily. 08/21/20   Frann Rider, NP  metFORMIN (GLUCOPHAGE) 500 MG tablet Daily with lunch 12/16/20   Mellody Dance, DO  metoprolol tartrate (LOPRESSOR) 50 MG tablet TAKE 1 TABLET BY MOUTH TWICE A DAY 06/25/20   Skeet Latch, MD  Misc Natural Products (DAILY HERBS IMMUNE DEFENSE PO) Take 2 capsules by mouth daily.    [provider]  Multiple Vitamins-Minerals (EMERGEN-C IMMUNE PLUS) PACK Take 1 packet by mouth daily.    [provider]  Multiple Vitamins-Minerals (MULTIVITAMIN WITH MINERALS) tablet Take 1 tablet by mouth daily.    [provider]  valACYclovir (VALTREX) 1000 MG tablet Take 1 tablet (1,000 mg total) by mouth 3 (three) times daily. 02/21/21   Quintella Reichert, MD    Allergies    Patient has no known allergies.  Review of Systems   Review of Systems  All other systems reviewed and are negative.  Physical Exam Updated Vital Signs BP (!) 160/94 (BP Location: Left Arm)   Pulse 73   Temp 98 F (36.7 C) (Oral)   Resp 18   SpO2 100%   Physical Exam Vitals and nursing note reviewed.  Constitutional:      General: She is not in acute distress.    Appearance: She is well-developed.  HENT:     Head: Normocephalic and atraumatic.      Comments: No scalp lesions, induration or  erythema    Right Ear: Tympanic membrane normal.     Left Ear: Tympanic membrane normal.     Mouth/Throat:     Mouth: Mucous membranes are moist.  Eyes:  Extraocular Movements: Extraocular movements intact.     Conjunctiva/sclera: Conjunctivae normal.     Pupils: Pupils are equal, round, and reactive to light.  Cardiovascular:     Rate and Rhythm: Normal rate.     Pulses: Normal pulses.  Pulmonary:     Effort: Pulmonary effort is normal. No respiratory distress.  Musculoskeletal:        General: No tenderness. Normal range of motion.     Cervical back: Normal range of motion and neck supple.  Skin:    General: Skin is warm and dry.     Findings: No erythema or rash.  Neurological:     Mental Status: She is alert and oriented to person, place, and time. Mental status is at baseline.     Cranial Nerves: No cranial nerve deficit.     Sensory: No sensory deficit.     Motor: No weakness.     Gait: Gait normal.  Psychiatric:        Mood and Affect: Mood normal.        Behavior: Behavior normal.    ED Results / Procedures / Treatments   Labs (all labs ordered are listed, but only abnormal results are displayed) Labs Reviewed - No data to display  EKG None  Radiology DG Chest 2 View  Result Date: 02/27/2021 CLINICAL DATA:  Right scalp soft tissue swelling. EXAM: CHEST - 2 VIEW COMPARISON:  January 04, 2019 FINDINGS: The heart size and mediastinal contours are within normal limits. Both lungs are clear. The visualized skeletal structures are unremarkable. IMPRESSION: No active cardiopulmonary disease. Electronically Signed   By: Virgina Norfolk M.D.   On: 02/27/2021 23:08   CT HEAD WO CONTRAST (5MM)  Result Date: 02/27/2021 CLINICAL DATA:  Headache, secondary EXAM: CT HEAD WITHOUT CONTRAST TECHNIQUE: Contiguous axial images were obtained from the base of the skull through the vertex without intravenous contrast. COMPARISON:  02/01/2009 FINDINGS: Brain: No evidence of acute  infarction, hemorrhage, hydrocephalus, extra-axial collection or mass lesion/mass effect. Vascular: No hyperdense vessel or unexpected calcification. Skull: Normal. Negative for fracture or focal lesion. Sinuses/Orbits: No acute finding. Other: None. IMPRESSION: No acute intracranial abnormalities. Electronically Signed   By: Lucienne Capers M.D.   On: 02/27/2021 22:41    Procedures Procedures   Medications Ordered in ED Medications - No data to display  ED Course  I have reviewed the triage vital signs and the nursing notes.  Pertinent labs & imaging results that were available during my care of the patient were reviewed by me and considered in my medical decision making (see chart for details).    MDM Rules/Calculators/A&P                           Patient presenting with nonspecific swelling to the right scalp area.  Is now been present for approximately 1 week and gradually worsening.  She has no visual involvement.  She has no temporal artery tenderness or forehead swelling noted.  Minimal periorbital swelling but no ocular involvement.  She has full extraocular movements and normal vision.  No cranial nerve deficits.  She has no pain or swelling noted to her neck.  Symptoms are not worse with lying down and chest x-ray is normal with low suspicion at this time for SVC syndrome.  Low suspicion for temporal arteritis given patient's age and no significant tenderness in that area.  CT is negative for any invasive lesions or acute intracranial abnormality.  No findings visually in the scalp concerning for shingles or cellulitis.  No fungal lesions.  Unclear the cause of patient's symptoms.  Concern for possible early trigeminal neuralgia.  Do not feel that antibiotics will be helpful and recommended the patient discontinue.  Did stress following up with her PCP and given return precautions.  MDM   Amount and/or Complexity of Data Reviewed Tests in the radiology section of CPT: ordered and  reviewed Independent visualization of images, tracings, or specimens: yes    Final Clinical Impression(s) / ED Diagnoses Final diagnoses:  Superficial swelling of scalp    Rx / DC Orders ED Discharge Orders     None        Blanchie Dessert, MD 02/27/21 2345

## 2021-02-27 NOTE — ED Triage Notes (Signed)
Pt c/o swelling to right scalp area-seen here for same 9/15-states swelling has increased and c/o right temporal/forehead pressure-NAD-steady gait

## 2021-04-08 ENCOUNTER — Telehealth (INDEPENDENT_AMBULATORY_CARE_PROVIDER_SITE_OTHER): Payer: 59 | Admitting: Family

## 2021-04-08 ENCOUNTER — Other Ambulatory Visit: Payer: Self-pay

## 2021-04-08 VITALS — BP 128/81 | Temp 98.8°F | Ht 59.0 in | Wt 149.0 lb

## 2021-04-08 DIAGNOSIS — E669 Obesity, unspecified: Secondary | ICD-10-CM

## 2021-04-08 DIAGNOSIS — Z6830 Body mass index (BMI) 30.0-30.9, adult: Secondary | ICD-10-CM

## 2021-04-08 DIAGNOSIS — G40309 Generalized idiopathic epilepsy and epileptic syndromes, not intractable, without status epilepticus: Secondary | ICD-10-CM | POA: Diagnosis not present

## 2021-04-08 DIAGNOSIS — I10 Essential (primary) hypertension: Secondary | ICD-10-CM

## 2021-04-08 MED ORDER — AMLODIPINE BESYLATE 5 MG PO TABS: 5.0000 mg | ORAL_TABLET | Freq: Every day | ORAL | 1 refills | Status: DC

## 2021-04-08 NOTE — Assessment & Plan Note (Signed)
Stable on lamictal for prophylaxis- management per neurology.

## 2021-04-08 NOTE — Assessment & Plan Note (Signed)
She is maintaining her weight and plans to follow up with [facility] [place].

## 2021-04-08 NOTE — Assessment & Plan Note (Signed)
BP stable. Continue current doses of amlodipine and metoprolol.

## 2021-04-08 NOTE — Progress Notes (Signed)
MyChart Video Visit    Virtual Visit via Video Note   This visit type was conducted due to national recommendations for restrictions regarding the COVID-19 Pandemic (e.g. social distancing) in an effort to limit this patient's exposure and mitigate transmission in our community. This patient is at least at moderate risk for complications without adequate follow up. This format is felt to be most appropriate for this patient at this time. Physical exam was limited by quality of the video and audio technology used for the visit.  CMA was able to get the patient set up on a video visit.  Patient location: Home Patient and provider in visit Provider location: Office  I discussed the limitations of evaluation and management by telemedicine and the availability of in person appointments. The patient expressed understanding and agreed to proceed.  Visit Date: 04/08/2021  Today's healthcare provider: Nance Pear, NP     Subjective:    Patient ID: Tiffany [name], female    DOB: [birth year], 55 y.o.   MRN: [id number]  Chief Complaint  Patient presents with   Hypertension    Needs refill    Hypertension  Patient is in today for a video visit.   Blood pressure- She continues taking 5 mg amlodipine daily PO, 50 mg metoprolol tartrate daily PO and reports no new issues while taking it. She is requesting a refill on them as well.   BP Readings from Last 3 Encounters:  04/08/21 128/81  02/27/21 (!) 160/94  02/21/21 (!) 141/78   Pulse Readings from Last 3 Encounters:  02/27/21 73  02/21/21 80  12/13/20 72   Cough- She continues using Symbicort inhalers to manage her cough and finds great relief.  Seizures- She continues taking 100 mg Lamictal 2x daily PO and reports not having any recent seizures.  Weight- She continues seeing a healthy weight and wellness program. She is planning on making another appointment.   Wt Readings from Last 3 Encounters:  04/08/21 149 lb  (67.6 kg)  02/21/21 149 lb 14.6 oz (68 kg)  12/13/20 150 lb (68 kg)   Swelling of scalp- She went to the ED on 02/21/2021 for swelling of the scalp. They found no obvious cause and her symptoms shortly resolved.  Immunizations- She is UTD on flu vaccines.  She reports receiving it last week. She has 2 Covid-19 vaccines and was informed of the new bivalent Covid-19 vaccine. She has not received the shingles vaccines.    Past Medical History:  Diagnosis Date   Dyspnea    Epilepsy ([place])    Hypertension    Microscopic hematuria 09/25/2016   Had complete work up with Dr. Wendy Poet.   Cysto unrevealing,  CT scan nl    Non Hodgkin's lymphoma ([place]) 2/08   stage III   Obesity    Palpitations    Seizure disorder ([place])    last Seizure 10 years ago around 2012 per pt   Shortness of breath 07/05/2019    Past Surgical History:  Procedure Laterality Date   [place], 2002   [facility] REMOVAL  2009   PORTACATH PLACEMENT  2008    Family History  Problem Relation Age of Onset   Breast cancer Mother    Hypertension Mother    COPD Mother    Diabetes Mother    Hepatitis C Father    Diabetes Father    Hypertension Father    Hyperlipidemia Father    Heart disease Father    Stroke  Father    Liver disease Father    Alcoholism Father    Colon cancer Father 9   Hypertension Maternal Grandmother    Hypertension Paternal Grandmother    Colon polyps Neg Hx    Esophageal cancer Neg Hx    Stomach cancer Neg Hx    Rectal cancer Neg Hx     Social History   Socioeconomic History   Marital status: Married    Spouse name: Iona Beard   Number of children: 2   Years of education: college   Highest education level: Not on file  Occupational History   Occupation: Engineer, building services: [employer]  Tobacco Use   Smoking status: Never   Smokeless tobacco: Never  Vaping Use   Vaping Use: Never used  Substance and Sexual Activity   Alcohol use: No   Drug use: No   Sexual  activity: Yes    Partners: Male  Other Topics Concern   Not on file  Social History Narrative   2 children   Married   Works as a Charity fundraiser   enjoys shopping, movies   Social Determinants of Radio broadcast assistant Strain: Not on Art therapist Insecurity: Not on file  Transportation Needs: Not on file  Physical Activity: Not on file  Stress: Not on file  Social Connections: Not on file  Intimate Partner Violence: Not on file    Outpatient Medications Prior to Visit  Medication Sig Dispense Refill   albuterol (VENTOLIN HFA) 108 (90 Base) MCG/ACT inhaler INHALE 2 PUFFS INTO THE LUNGS EVERY 4 HOURS AS NEEDED FOR WHEEZE OR FOR SHORTNESS OF BREATH 18 each 1   budesonide-formoterol (SYMBICORT) 160-4.5 MCG/ACT inhaler Inhale 2 puffs into the lungs 2 (two) times daily. 3 each 3   lamoTRIgine (LAMICTAL) 100 MG tablet Take 1 tablet (100 mg total) by mouth 2 (two) times daily. 180 tablet 3   metFORMIN (GLUCOPHAGE) 500 MG tablet Daily with lunch 90 tablet 0   metoprolol tartrate (LOPRESSOR) 50 MG tablet TAKE 1 TABLET BY MOUTH TWICE A DAY 180 tablet 3   Misc Natural Products (DAILY HERBS IMMUNE DEFENSE PO) Take 2 capsules by mouth daily.     Multiple Vitamins-Minerals (EMERGEN-C IMMUNE PLUS) PACK Take 1 packet by mouth daily.     Multiple Vitamins-Minerals (MULTIVITAMIN WITH MINERALS) tablet Take 1 tablet by mouth daily.     amLODipine (NORVASC) 5 MG tablet Take 1 tablet (5 mg total) by mouth daily. 90 tablet 0   cephALEXin (KEFLEX) 500 MG capsule Take 1 capsule (500 mg total) by mouth 4 (four) times daily. 20 capsule 0   valACYclovir (VALTREX) 1000 MG tablet Take 1 tablet (1,000 mg total) by mouth 3 (three) times daily. 21 tablet 0   No facility-administered medications prior to visit.    No Known Allergies  ROS    See HPI Objective:    Physical Exam Constitutional:      General: She is not in acute distress.    Appearance: Normal appearance. She is not ill-appearing.      Comments: Patient reports her weight is 149 lb's at this time.  Patient reports their temperature is 98.8 degrees F at this time  Cardiovascular:     Comments: Patient reports her blood pressure is 128/81 and her pulse is 81 bpm Pulmonary:     Effort: Pulmonary effort is normal.  Neurological:     Mental Status: She is alert.  Psychiatric:  Behavior: Behavior normal.        Judgment: Judgment normal.    BP 128/81   Temp 98.8 F (37.1 C)   Ht 4\' 11"  (1.499 m)   Wt 149 lb (67.6 kg)   BMI 30.09 kg/m  Wt Readings from Last 3 Encounters:  04/08/21 149 lb (67.6 kg)  02/21/21 149 lb 14.6 oz (68 kg)  12/13/20 150 lb (68 kg)    Diabetic Foot Exam - Simple   No data filed    Lab Results  Component Value Date   WBC 6.2 10/10/2020   HGB 12.8 10/10/2020   HCT 41.2 10/10/2020   PLT 391 10/10/2020   GLUCOSE 98 10/10/2020   CHOL 177 10/20/2019   TRIG 106 10/20/2019   HDL 60 10/20/2019   LDLCALC 97 10/20/2019   ALT 13 10/10/2020   AST 14 10/10/2020   NA 139 10/10/2020   K 4.5 10/10/2020   CL 103 10/10/2020   CREATININE 0.91 10/10/2020   BUN 9 10/10/2020   CO2 30 10/10/2020   TSH 1.49 10/20/2019   HGBA1C 5.2 10/10/2020    Lab Results  Component Value Date   TSH 1.49 10/20/2019   Lab Results  Component Value Date   WBC 6.2 10/10/2020   HGB 12.8 10/10/2020   HCT 41.2 10/10/2020   MCV 82.1 10/10/2020   PLT 391 10/10/2020   Lab Results  Component Value Date   NA 139 10/10/2020   K 4.5 10/10/2020   CO2 30 10/10/2020   GLUCOSE 98 10/10/2020   BUN 9 10/10/2020   CREATININE 0.91 10/10/2020   BILITOT 0.5 10/10/2020   ALKPHOS 104 01/04/2019   AST 14 10/10/2020   ALT 13 10/10/2020   PROT 6.8 10/10/2020   ALBUMIN 4.3 01/04/2019   CALCIUM 9.8 10/10/2020   ANIONGAP 10 01/04/2019   Lab Results  Component Value Date   CHOL 177 10/20/2019   Lab Results  Component Value Date   HDL 60 10/20/2019   Lab Results  Component Value Date   LDLCALC 97 10/20/2019    Lab Results  Component Value Date   TRIG 106 10/20/2019   Lab Results  Component Value Date   CHOLHDL 3.0 10/20/2019   Lab Results  Component Value Date   HGBA1C 5.2 10/10/2020       Assessment & Plan:   Problem List Items Addressed This Visit       Unprioritized   Seizure disorder, 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       Essential hypertension - Primary    BP stable. Continue current doses of amlodipine and metoprolol.       Relevant Medications   amLODipine (NORVASC) 5 MG tablet   Class 1 obesity with serious comorbidity and body mass index (BMI) of 30.0 to 30.9 in adult    She is maintaining her weight and plans to follow up with the [facility] and [place].         Meds ordered this encounter  Medications   amLODipine (NORVASC) 5 MG tablet    Sig: Take 1 tablet (5 mg total) by mouth daily.    Dispense:  90 tablet    Refill:  1    Pt to take amlodipine 5mg  QD    I discussed the assessment and treatment plan with the patient. The patient was provided an opportunity to ask questions and all were answered. The patient agreed with the plan and demonstrated  an understanding of the instructions.   The patient was advised to call back or seek an in-person evaluation if the symptoms worsen or if the condition fails to improve as anticipated.  I,Shehryar Multimedia programmer as a Education administrator for [employer], NP.,have documented all relevant documentation on the behalf of Nance Pear, NP,as directed by  Nance Pear, NP while in the presence of Nance Pear, NP.  I provided 20 minutes of face-to-face time during this encounter.   Nance Pear, NP [employer] at [employer] [phone] (phone) [phone] (fax)  [place]

## 2021-04-13 ENCOUNTER — Other Ambulatory Visit: Payer: Self-pay | Admitting: Cardiovascular Disease

## 2021-07-21 ENCOUNTER — Other Ambulatory Visit: Payer: Self-pay | Admitting: Critical Care Medicine

## 2021-08-16 ENCOUNTER — Other Ambulatory Visit: Payer: Self-pay | Admitting: Adult Health

## 2021-08-16 DIAGNOSIS — G40309 Generalized idiopathic epilepsy and epileptic syndromes, not intractable, without status epilepticus: Secondary | ICD-10-CM

## 2021-08-21 ENCOUNTER — Ambulatory Visit: Payer: 59 | Admitting: Adult Health

## 2021-08-22 ENCOUNTER — Other Ambulatory Visit: Payer: Self-pay | Admitting: Family

## 2021-09-09 ENCOUNTER — Ambulatory Visit: Payer: Self-pay | Admitting: Adult Health

## 2021-09-09 ENCOUNTER — Encounter: Payer: Self-pay | Admitting: Adult Health

## 2021-09-09 NOTE — Progress Notes (Deleted)
? ?[facility] ? ?PATIENT: Tiffany [name] ?DOB: [birth year] ? ? ?REASON FOR VISIT: follow-up for generalized epilepsy ?[place] provider: Dr. Leonie Man ? ?Chief complaint: ?No chief complaint on file. ?  ? ?HISTORY OF PRESENT ILLNESS: ? ?Update 09/09/2021 JM: 56 year old female who returns for yearly seizure follow-up.  Overall well without any reoccurring seizure activity.  Compliant on lamotrigine 100 mg twice daily, denies side effects.  Continues to maintain ADLs and IADLs independently as well as working and driving.  No new concerns at this time. ? ? ? ? ? ? ? ?History provided for reference purposes only ?Update 08/21/2020 JM: Tiffany [name] returns for yearly seizure follow-up ? ?Doing well since prior visit without recurrent seizure activity ?Reports compliance on lamotrigine 100 mg twice daily -tolerating without side effects ?Prior lamotrigine level 5.1 - CBC and CMP obtained during her work which was satisfactory ?Continues working at [employer], driving and maintains ADLs and IADLs independently  ? ?No concerns at this time ? ? ?Update 08/22/2019 JM: Tiffany [name] is a 56 year old female who is being seen today, 08/22/2019, for 1 year seizure follow-up.  Continues on Lamictal 100 mg twice daily tolerating well without recent seizure activity.  Last seizure activity approximately 8 years ago.  No neurological concerns at this time.  She does report ongoing difficulty with weight gain. ? ?UPDATE 3/12/2020CM Tiffany [name], 56 year old female returns for follow-up with history of generalized seizure disorder.  Last seizure occurred 7 years ago.  She is currently on Lamictal 100 mg twice daily without side effects.  Both MRI of the brain and EEG in the past have been normal.  She continues to gain weight.  She denies any interval medical issues she exercises very little.  She returns for reevaluation ? ?UPDATE 08/18/17 CM Tiffany [name] , 56 year old female returns for yearly  follow-up.  She was last seen 08/07/2016.  She has a history of generalized seizure disorder. Her last seizure was approximately 6 years ago. She is currently on Lamictal tolerating the medication without difficulty. EEG in the past has been normal as well as MRI of the brain. She has not had any interval medical problems. She is overweight. She claims she loves breads, pasta and sweets.  She was encouraged to try to lose weight to prevent long-term chronic medical issues.  She returns for reevaluation and refills ? ?REVIEW OF SYSTEMS: Full 14 system review of systems performed and notable only for those listed, all others are neg:  ?Constitutional: neg ?Cardiovascular: neg ?Ear/Nose/Throat: neg  ?Skin: neg ?Eyes: neg ?Respiratory: neg ?Gastroitestinal: neg  ?Hematology/Lymphatic: neg  ?Endocrine: neg ?Musculoskeletal:neg ?Allergy/Immunology: neg ?Neurological: History of seizure disorder ?Psychiatric: neg ?Sleep : neg ? ? ?ALLERGIES: ?No Known Allergies ? ?HOME MEDICATIONS: ?Outpatient Medications Prior to Visit  ?Medication Sig Dispense Refill  ? albuterol (VENTOLIN HFA) 108 (90 Base) MCG/ACT inhaler INHALE 2 PUFFS INTO THE LUNGS EVERY 4 HOURS AS NEEDED FOR WHEEZE OR FOR SHORTNESS OF BREATH 18 each 1  ? amLODipine (NORVASC) 5 MG tablet Take 1 tablet (5 mg total) by mouth daily. 90 tablet 1  ? budesonide-formoterol (SYMBICORT) 160-4.5 MCG/ACT inhaler Inhale 2 puffs into the lungs 2 (two) times daily. 3 each 3  ? lamoTRIgine (LAMICTAL) 100 MG tablet TAKE 1 TABLET BY MOUTH TWICE A DAY 180 tablet 3  ? metFORMIN (GLUCOPHAGE) 500 MG tablet Daily with lunch 90 tablet 0  ? metoprolol tartrate (LOPRESSOR) 50 MG tablet TAKE 1 TABLET BY MOUTH TWICE A DAY 180 tablet 1  ?  Misc Natural Products (DAILY HERBS IMMUNE DEFENSE PO) Take 2 capsules by mouth daily.    ? Multiple Vitamins-Minerals (EMERGEN-C IMMUNE PLUS) PACK Take 1 packet by mouth daily.    ? Multiple Vitamins-Minerals (MULTIVITAMIN WITH MINERALS) tablet Take 1 tablet by  mouth daily.    ? ?No facility-administered medications prior to visit.  ? ? ?PAST MEDICAL HISTORY: ?Past Medical History:  ?Diagnosis Date  ? Dyspnea   ? Epilepsy ([place])   ? Hypertension   ? Microscopic hematuria 09/25/2016  ? Had complete work up with Dr. Wendy Poet.   Cysto unrevealing,  CT scan nl   ? Non Hodgkin's lymphoma ([place]) 2/08  ? stage III  ? Obesity   ? Palpitations   ? Seizure disorder ([place])   ? last Seizure 10 years ago around 2012 per pt  ? Shortness of breath 07/05/2019  ? ? ?PAST SURGICAL HISTORY: ?Past Surgical History:  ?Procedure Laterality Date  ? [place], 2002  ? PORT-A-CATH REMOVAL  2009  ? PORTACATH PLACEMENT  2008  ? ? ?FAMILY HISTORY: ?Family History  ?Problem Relation Age of Onset  ? Breast cancer Mother   ? Hypertension Mother   ? COPD Mother   ? Diabetes Mother   ? Hepatitis C Father   ? Diabetes Father   ? Hypertension Father   ? Hyperlipidemia Father   ? Heart disease Father   ? Stroke Father   ? Liver disease Father   ? Alcoholism Father   ? Colon cancer Father 48  ? Hypertension Maternal Grandmother   ? Hypertension Paternal Grandmother   ? Colon polyps Neg Hx   ? Esophageal cancer Neg Hx   ? Stomach cancer Neg Hx   ? Rectal cancer Neg Hx   ? ? ?SOCIAL HISTORY: ?Social History  ? ?Socioeconomic History  ? Marital status: Married  ?  Spouse name: Iona Beard  ? Number of children: 2  ? Years of education: college  ? Highest education level: Not on file  ?Occupational History  ? Occupation: PHLEBOTOMIST  ?  Employer: [employer]  ?Tobacco Use  ? Smoking status: Never  ? Smokeless tobacco: Never  ?Vaping Use  ? Vaping Use: Never used  ?Substance and Sexual Activity  ? Alcohol use: No  ? Drug use: No  ? Sexual activity: Yes  ?  Partners: Male  ?Other Topics Concern  ? Not on file  ?Social History Narrative  ? 2 children  ? Married  ? Works as a Charity fundraiser  ? enjoys shopping, movies  ? ?Social Determinants of Health  ? ?Financial Resource Strain: Not on file  ?Food Insecurity: Not  on file  ?Transportation Needs: Not on file  ?Physical Activity: Not on file  ?Stress: Not on file  ?Social Connections: Not on file  ?Intimate Partner Violence: Not on file  ? ? ? ?PHYSICAL EXAM ? ?There were no vitals filed for this visit. ? ?There is no height or weight on file to calculate BMI. ? ? ?General: well developed, well nourished, pleasant middle-aged African-American female, seated, in no evident distress ?Head: head normocephalic and atraumatic.   ?Neck: supple with no carotid or supraclavicular bruits ?Cardiovascular: regular rate and rhythm, no murmurs ?Musculoskeletal: no deformity ?Skin:  no rash/petichiae ?Vascular:  Normal pulses all extremities ?  ?Neurologic Exam ?Mental Status: Awake and fully alert.   Normal speech and language.  Oriented to place and time. Recent and remote memory intact. Attention span, concentration and fund of knowledge appropriate. Mood  and affect appropriate.  ?Cranial Nerves: Pupils equal, briskly reactive to light. Extraocular movements full without nystagmus. Visual fields full to confrontation. Hearing intact. Facial sensation intact. Face, tongue, palate moves normally and symmetrically.  ?Motor: Normal bulk and tone. Normal strength in all tested extremity muscles. ?Sensory.: intact to touch , pinprick , position and vibratory sensation.  ?Coordination: Rapid alternating movements normal in all extremities. Finger-to-nose and heel-to-shin performed accurately bilaterally. ?Gait and Station: Arises from chair without difficulty. Stance is normal. Gait demonstrates normal stride length and balance ?Reflexes: 1+ and symmetric. Toes downgoing.  ? ? ? ?DIAGNOSTIC DATA (LABS, IMAGING, TESTING) ? ? ?  Latest Ref Rng & Units 10/10/2020  ?  8:36 AM 06/20/2020  ?  8:56 AM 10/20/2019  ?  8:46 AM  ?BMP  ?Glucose 65 - 99 mg/dL 98   84   109    ?BUN 7 - 25 mg/dL '9   11   17    '$ ?Creatinine 0.50 - 1.05 mg/dL 0.91   0.82   0.76    ?BUN/Creat Ratio 6 - 22 (calc) NOT APPLICABLE   NOT  APPLICABLE   NOT APPLICABLE    ?Sodium 135 - 146 mmol/L 139   139   137    ?Potassium 3.5 - 5.3 mmol/L 4.5   4.7   4.6    ?Chloride 98 - 110 mmol/L 103   102   101    ?CO2 20 - 32 mmol/L 30   27   29

## 2021-09-18 ENCOUNTER — Ambulatory Visit: Payer: 59 | Admitting: Adult Health

## 2021-09-24 ENCOUNTER — Ambulatory Visit (INDEPENDENT_AMBULATORY_CARE_PROVIDER_SITE_OTHER): Payer: PRIVATE HEALTH INSURANCE | Admitting: Family

## 2021-09-24 ENCOUNTER — Encounter (HOSPITAL_BASED_OUTPATIENT_CLINIC_OR_DEPARTMENT_OTHER): Payer: Self-pay | Admitting: Family

## 2021-09-24 VITALS — BP 138/88 | HR 68 | Ht 59.0 in | Wt 154.1 lb

## 2021-09-24 DIAGNOSIS — E6609 Other obesity due to excess calories: Secondary | ICD-10-CM

## 2021-09-24 DIAGNOSIS — Z6831 Body mass index (BMI) 31.0-31.9, adult: Secondary | ICD-10-CM

## 2021-09-24 DIAGNOSIS — I10 Essential (primary) hypertension: Secondary | ICD-10-CM

## 2021-09-24 MED ORDER — METOPROLOL TARTRATE 50 MG PO TABS: 50.0000 mg | ORAL_TABLET | Freq: Two times a day (BID) | ORAL | 3 refills | Status: AC

## 2021-09-24 NOTE — Progress Notes (Signed)
? ?Office Visit  ?  ?Patient Name: Tiffany [name] ?Date of Encounter: 09/24/2021 ? ?PCP:  Debbrah Alar, NP ?  ?[place]  ?Cardiologist:  Skeet Latch, MD  ?Advanced Practice Provider:  No care team member to display ?Electrophysiologist:  None  ?   ? ?Chief Complaint  ?  ?Tiffany [name] is a 56 y.o. female with a hx of non-Hodgkin's lymphoma, seizure disorder, hypertension, obesity presents today for follow-up of hypertension ? ?Past Medical History  ?  ?Past Medical History:  ?Diagnosis Date  ? Dyspnea   ? Epilepsy ([place])   ? Hypertension   ? Microscopic hematuria 09/25/2016  ? Had complete work up with Dr. Wendy Poet.   Cysto unrevealing,  CT scan nl   ? Non Hodgkin's lymphoma ([place]) 2/08  ? stage III  ? Obesity   ? Palpitations   ? Seizure disorder ([place])   ? last Seizure 10 years ago around 2012 per pt  ? Shortness of breath 07/05/2019  ? ?Past Surgical History:  ?Procedure Laterality Date  ? [place], 2002  ? PORT-A-CATH REMOVAL  2009  ? PORTACATH PLACEMENT  2008  ? ? ?Allergies ? ?No Known Allergies ? ?History of Present Illness  ?  ?Tiffany [name] is a 56 y.o. female with a hx of non-Hodgkin's lymphoma, seizure disorder, hypertension, obesity last seen 08/2619 by Almyra Deforest, PA. ? ?Echocardiogram November 2020 LVEF 60 to 65%, mild LVH, grade 1 diastolic dysfunction.  Myoview November 2020 with EF 37%, ischemia in the basal to mid inferior septal, basal to mid inferior and apical region.  She was referred for coronary CTA however canceled due to heart rate in the 70s after metoprolol.  Coronary CTA was able to be obtained 07/21/2019 demonstrating low cardiac calcium score of 2.24 placing her in the 85th percentile for age and sex matched control, normal coronary arteries with right dominant system, no coronary disease.  Noncardiac read demonstrated nonspecific soft tissue strain within anterior mediastinum. ? ?Presents today for follow-up.  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 Reports no shortness of breath nor dyspnea on exertion. Reports no chest pain, pressure, or tightness. No edema, orthopnea, PND. Reports no palpitations.  She checks her blood pressure intermittently at home and reports readings are most often at goal of less than 130/80.  She enjoys exercising by walking with a walking trail near her home.  She endorses eating a mostly heart healthy diet and try to cook at home whenever possible. ? ?EKGs/Labs/Other Studies Reviewed:  ? ?The following studies were reviewed today: ?Echo 04/20/2019 ?1. Left ventricular ejection fraction, by visual estimation, is 60 to  ?65%. The left ventricle has normal function. Left ventricular septal wall  ?thickness was mildly increased. Mildly increased left ventricular  ?posterior wall thickness. There is mildly  ?increased left ventricular hypertrophy.  ? 2. Left ventricular diastolic parameters are consistent with Grade I  ?diastolic dysfunction (impaired relaxation).  ? 3. The left ventricle has no regional wall motion abnormalities.  ? 4. Global right ventricle has normal systolic function.The right  ?ventricular size is normal. No increase in right ventricular wall  ?thickness.  ? 5. Left atrial size was normal.  ? 6. Right atrial size was normal.  ? 7. The mitral valve is normal in structure. No evidence of mitral valve  ?regurgitation. No evidence of mitral stenosis.  ? 8. The tricuspid valve is normal in structure. Tricuspid valve  ?regurgitation is trivial.  ?  9. The aortic valve is tricuspid. Aortic valve regurgitation is not  ?visualized. No evidence of aortic valve sclerosis or stenosis.  ?10. The pulmonic valve was normal in structure. Pulmonic valve  ?regurgitation is not visualized.  ?11. Normal pulmonary artery systolic pressure.  ?12. The inferior vena cava is normal in size with greater than 50%  ?respiratory variability, suggesting right atrial pressure of 3 mmHg.   ? ?  ?Coronary CT 07/21/2019 ?IMPRESSION: ?1. Low coronary calcium score of 2.24. This was 85th percentile for ?age and sex matched control. ?  ?2. Normal coronary origin with right dominance. ?  ?3. No evidence of CAD. ?  ?4. CAD-RADS 0. No evidence of CAD (0%). Consider non-atherosclerotic ?causes of chest pain. ? ?EKG:  EKG is ordered today.  The ekg ordered today demonstrates NSR 68  bpm with no acute ST/T wave changes.  ? ?Recent Labs: ?10/10/2020: ALT 13; BUN 9; Creat 0.91; Hemoglobin 12.8; Platelets 391; Potassium 4.5; Sodium 139  ?Recent Lipid Panel ?   ?Component Value Date/Time  ? CHOL 177 10/20/2019 0846  ? TRIG 106 10/20/2019 0846  ? HDL 60 10/20/2019 0846  ? CHOLHDL 3.0 10/20/2019 0846  ? VLDL 22 09/22/2016 1318  ? [place] 97 10/20/2019 0846  ? ? ? ?Home Medications  ? ?Current Meds  ?Medication Sig  ? amLODipine (NORVASC) 5 MG tablet Take 1 tablet (5 mg total) by mouth daily.  ? budesonide-formoterol (SYMBICORT) 160-4.5 MCG/ACT inhaler Inhale 2 puffs into the lungs 2 (two) times daily.  ? lamoTRIgine (LAMICTAL) 100 MG tablet TAKE 1 TABLET BY MOUTH TWICE A DAY  ? metFORMIN (GLUCOPHAGE) 500 MG tablet Daily with lunch  ? metoprolol tartrate (LOPRESSOR) 50 MG tablet TAKE 1 TABLET BY MOUTH TWICE A DAY  ? Misc Natural Products (DAILY HERBS IMMUNE DEFENSE PO) Take 2 capsules by mouth daily.  ? Multiple Vitamins-Minerals (EMERGEN-C IMMUNE PLUS) PACK Take 1 packet by mouth daily.  ? Multiple Vitamins-Minerals (MULTIVITAMIN WITH MINERALS) tablet Take 1 tablet by mouth daily.  ?  ?Review of Systems  ?    ?All other systems reviewed and are otherwise negative except as noted above. ? ?Physical Exam  ?  ?VS:  BP (!) 148/92 (BP Location: Left Arm, Patient Position: Sitting, Cuff Size: Normal)   Pulse 68   Ht '4\' 11"'$  (1.499 m)   Wt 154 lb 1.6 oz (69.9 kg)   BMI 31.12 kg/m?  , BMI Body mass index is 31.12 kg/m?. ? ?Wt Readings from Last 3 Encounters:  ?09/24/21 154 lb 1.6 oz (69.9 kg)  ?04/08/21 149 lb (67.6 kg)   ?02/21/21 149 lb 14.6 oz (68 kg)  ?  ?GEN: Well nourished, well developed, in no acute distress. ?HEENT: normal. ?Neck: Supple, no JVD, carotid bruits, or masses. ?Cardiac: RRR, no murmurs, rubs, or gallops. No clubbing, cyanosis, edema.  Radials/PT 2+ and equal bilaterally.  ?Respiratory:  Respirations regular and unlabored, clear to auscultation bilaterally. ?GI: Soft, nontender, nondistended. ?MS: No deformity or atrophy. ?Skin: Warm and dry, no rash. ?Neuro:  Strength and sensation are intact. ?Psych: Normal affect. ? ?Assessment & Plan  ?  ?HTN -initial BP in clinic 148/92 though on recheck after sitting and resting 138/88.  Continue metoprolol 50 mg twice daily, amlodipine 5 mg daily.  Metoprolol refill provided.  She will check her blood pressure at home for 2 weeks and report back if consistently greater than 130/80.  If so, increase amlodipine to 10 mg. ? ?Chest pain - No recurrence. Previous coronary  calcium score 07/2019 with no CAD. No indication for further evaluation.  Continue primary prevention with her healthy diet and regular cardiovascular exercise. ? ?Obesity - Weight loss via diet and exercise encouraged. Discussed the impact being overweight would have on cardiovascular risk.  ? ?Seizure disorder - Follows with neurology.  ? ?Disposition: Follow up in 1 year(s) with Skeet Latch, MD or APP. ? ?Signed, ?Loel Dubonnet, NP ?09/24/2021, 8:12 AM ?[facility] ?

## 2021-09-24 NOTE — Patient Instructions (Signed)
Medication Instructions:  ?Your Physician recommend you continue on your current medication as directed.   ? ?*If you need a refill on your cardiac medications before your next appointment, please call your pharmacy* ? ? ?Lab Work: ?None ordered today  ? ?Testing/Procedures: ?EKG showed Normal Sinus Rhythm today. Good result!  ? ? ?Follow-Up: ?At [facility], you and your health needs are our priority.  As part of our continuing mission to provide you with exceptional heart care, we have created designated Provider Care Teams.  These Care Teams include your primary Cardiologist (physician) and Advanced Practice Providers (APPs -  Physician Assistants and Nurse Practitioners) who all work together to provide you with the care you need, when you need it. ? ?We recommend signing up for the patient portal called "MyChart".  Sign up information is provided on this After Visit Summary.  MyChart is used to connect with patients for Virtual Visits (Telemedicine).  Patients are able to view lab/test results, encounter notes, upcoming appointments, etc.  Non-urgent messages can be sent to your provider as well.   ?To learn more about what you can do with MyChart, go to [email or web address].   ? ?Your next appointment:   ?1 year(s) ? ?The format for your next appointment:   ?In Person ? ?Provider:   ?Skeet Latch, MD or Laurann Montana, NP{ ? ?Other Instructions ?Heart Healthy Diet Recommendations: ?A low-salt diet is recommended. Meats should be grilled, baked, or boiled. Avoid fried foods. Focus on lean protein sources like fish or chicken with vegetables and fruits. The American Heart Association is a Microbiologist!  American Heart Association Diet and Lifeystyle Recommendations  ? ?Exercise recommendations: ?The American Heart Association recommends 150 minutes of moderate intensity exercise weekly. ?Try 30 minutes of moderate intensity exercise 4-5 times per week. ?This could include walking, jogging, or  swimming. ? ? ?Important Information About Sugar ? ? ? ? ?' ?

## 2021-10-08 ENCOUNTER — Encounter (HOSPITAL_BASED_OUTPATIENT_CLINIC_OR_DEPARTMENT_OTHER): Payer: Self-pay

## 2021-10-11 ENCOUNTER — Ambulatory Visit
Admission: RE | Admit: 2021-10-11 | Discharge: 2021-10-11 | Disposition: A | Discharge status: Home / Self Care | Payer: PRIVATE HEALTH INSURANCE | Source: Ambulatory Visit | Attending: Family | Admitting: Family

## 2021-10-11 DIAGNOSIS — Z1231 Encounter for screening mammogram for malignant neoplasm of breast: Secondary | ICD-10-CM

## 2021-10-16 ENCOUNTER — Encounter (HOSPITAL_BASED_OUTPATIENT_CLINIC_OR_DEPARTMENT_OTHER): Payer: Self-pay

## 2021-10-16 DIAGNOSIS — I10 Essential (primary) hypertension: Secondary | ICD-10-CM

## 2021-10-16 NOTE — Telephone Encounter (Signed)
BP log as requested 

## 2021-10-17 ENCOUNTER — Other Ambulatory Visit (HOSPITAL_BASED_OUTPATIENT_CLINIC_OR_DEPARTMENT_OTHER): Payer: Self-pay

## 2021-10-17 MED ORDER — AMLODIPINE BESYLATE 5 MG PO TABS
7.5000 mg | ORAL_TABLET | Freq: Every day | ORAL | 3 refills | Status: AC
  Filled 2021-10-17: qty 135, 90d supply, fill #0

## 2021-10-17 MED ORDER — AMLODIPINE BESYLATE 5 MG PO TABS: 7.5000 mg | ORAL_TABLET | Freq: Every day | ORAL | 3 refills | Status: DC

## 2021-10-17 NOTE — Addendum Note (Signed)
Addended by: Gerald Stabs on: 10/17/2021 10:01 AM ? ? Modules accepted: Orders ? ?

## 2021-10-31 ENCOUNTER — Encounter (HOSPITAL_BASED_OUTPATIENT_CLINIC_OR_DEPARTMENT_OTHER): Payer: Self-pay

## 2022-01-15 ENCOUNTER — Encounter (INDEPENDENT_AMBULATORY_CARE_PROVIDER_SITE_OTHER): Payer: Self-pay

## 2022-09-08 ENCOUNTER — Encounter: Payer: Self-pay | Admitting: Adult Health

## 2022-09-09 ENCOUNTER — Other Ambulatory Visit: Payer: Self-pay

## 2022-09-09 DIAGNOSIS — G40309 Generalized idiopathic epilepsy and epileptic syndromes, not intractable, without status epilepticus: Secondary | ICD-10-CM

## 2022-09-09 MED ORDER — LAMOTRIGINE 100 MG PO TABS: 100.0000 mg | ORAL_TABLET | Freq: Two times a day (BID) | ORAL | 0 refills | Status: AC

## 2022-12-24 ENCOUNTER — Ambulatory Visit: Payer: PRIVATE HEALTH INSURANCE | Admitting: Adult Health

## 2023-01-09 ENCOUNTER — Other Ambulatory Visit: Payer: Self-pay | Admitting: Family

## 2023-01-09 ENCOUNTER — Other Ambulatory Visit: Payer: Self-pay | Admitting: Physician Assistant

## 2023-01-09 DIAGNOSIS — Z1231 Encounter for screening mammogram for malignant neoplasm of breast: Secondary | ICD-10-CM

## 2023-01-16 DIAGNOSIS — Z1231 Encounter for screening mammogram for malignant neoplasm of breast: Secondary | ICD-10-CM

## 2023-01-19 ENCOUNTER — Ambulatory Visit
Admission: RE | Admit: 2023-01-19 | Discharge: 2023-01-19 | Discharge status: Home / Self Care | Payer: PRIVATE HEALTH INSURANCE | Source: Ambulatory Visit | Attending: Family | Admitting: Family

## 2023-01-19 DIAGNOSIS — Z1231 Encounter for screening mammogram for malignant neoplasm of breast: Secondary | ICD-10-CM

## 2023-10-09 ENCOUNTER — Encounter: Payer: Self-pay | Admitting: Gastroenterology

## 2024-02-09 ENCOUNTER — Other Ambulatory Visit: Payer: Self-pay

## 2024-02-09 ENCOUNTER — Other Ambulatory Visit: Payer: Self-pay | Admitting: Family

## 2024-02-09 DIAGNOSIS — Z1231 Encounter for screening mammogram for malignant neoplasm of breast: Secondary | ICD-10-CM

## 2024-02-17 ENCOUNTER — Ambulatory Visit
Admission: RE | Admit: 2024-02-17 | Discharge: 2024-02-17 | Disposition: A | Discharge status: Home / Self Care | Source: Ambulatory Visit

## 2024-02-17 DIAGNOSIS — Z1231 Encounter for screening mammogram for malignant neoplasm of breast: Secondary | ICD-10-CM
